# Patient Record
Sex: Male | Born: 1950
Health system: Southern US, Community
[De-identification: ages and names within clinical notes are randomized; demographics above are authoritative.]

## PROBLEM LIST (undated history)

## (undated) DIAGNOSIS — K449 Diaphragmatic hernia without obstruction or gangrene: Secondary | ICD-10-CM

## (undated) DIAGNOSIS — I2510 Atherosclerotic heart disease of native coronary artery without angina pectoris: Secondary | ICD-10-CM

## (undated) DIAGNOSIS — I10 Essential (primary) hypertension: Secondary | ICD-10-CM

## (undated) DIAGNOSIS — T7840XA Allergy, unspecified, initial encounter: Secondary | ICD-10-CM

## (undated) DIAGNOSIS — K56609 Unspecified intestinal obstruction, unspecified as to partial versus complete obstruction: Secondary | ICD-10-CM

## (undated) HISTORY — PX: GASTRIC BYPASS: SHX52

## (undated) HISTORY — PX: CARDIAC CATHETERIZATION: SHX172

## (undated) HISTORY — DX: Atherosclerotic heart disease of native coronary artery without angina pectoris: I25.10

## (undated) HISTORY — DX: Essential (primary) hypertension: I10

## (undated) HISTORY — PX: LAPAROSCOPIC LYSIS OF ADHESIONS: SHX5905

## (undated) HISTORY — PX: APPENDECTOMY: SHX54

## (undated) HISTORY — DX: Unspecified intestinal obstruction, unspecified as to partial versus complete obstruction: K56.609

## (undated) HISTORY — DX: Allergy, unspecified, initial encounter: T78.40XA

## (undated) HISTORY — DX: Diaphragmatic hernia without obstruction or gangrene: K44.9

---

## 2000-01-25 ENCOUNTER — Other Ambulatory Visit: Admission: RE | Admit: 2000-01-25 | Discharge: 2000-01-25 | Payer: Self-pay | Admitting: Family Medicine

## 2000-03-25 ENCOUNTER — Ambulatory Visit (HOSPITAL_BASED_OUTPATIENT_CLINIC_OR_DEPARTMENT_OTHER): Admission: RE | Admit: 2000-03-25 | Discharge: 2000-03-25 | Payer: Self-pay | Admitting: Family Medicine

## 2003-08-14 ENCOUNTER — Encounter (INDEPENDENT_AMBULATORY_CARE_PROVIDER_SITE_OTHER): Payer: Self-pay | Admitting: Specialist

## 2003-08-14 ENCOUNTER — Ambulatory Visit (HOSPITAL_COMMUNITY): Admission: RE | Admit: 2003-08-14 | Discharge: 2003-08-14 | Payer: Self-pay | Admitting: Gastroenterology

## 2004-10-06 ENCOUNTER — Ambulatory Visit: Payer: Self-pay | Admitting: Cardiology

## 2004-10-13 ENCOUNTER — Ambulatory Visit: Payer: Self-pay

## 2007-10-25 ENCOUNTER — Ambulatory Visit (HOSPITAL_COMMUNITY): Admission: RE | Admit: 2007-10-25 | Discharge: 2007-10-25 | Payer: Self-pay | Admitting: Surgery

## 2007-10-31 ENCOUNTER — Encounter: Admission: RE | Admit: 2007-10-31 | Discharge: 2007-10-31 | Payer: Self-pay | Admitting: Surgery

## 2007-10-31 ENCOUNTER — Ambulatory Visit (HOSPITAL_COMMUNITY): Admission: RE | Admit: 2007-10-31 | Discharge: 2007-10-31 | Payer: Self-pay | Admitting: Surgery

## 2008-01-09 ENCOUNTER — Ambulatory Visit (HOSPITAL_COMMUNITY): Admission: RE | Admit: 2008-01-09 | Discharge: 2008-01-09 | Payer: Self-pay | Admitting: Surgery

## 2008-02-27 ENCOUNTER — Ambulatory Visit: Payer: Self-pay | Admitting: Surgery

## 2008-07-06 ENCOUNTER — Encounter: Admission: RE | Admit: 2008-07-06 | Discharge: 2008-07-06 | Payer: Self-pay | Admitting: Family Medicine

## 2008-12-04 ENCOUNTER — Encounter: Admission: RE | Admit: 2008-12-04 | Discharge: 2008-12-04 | Payer: Self-pay | Admitting: Family Medicine

## 2009-05-05 ENCOUNTER — Encounter: Admission: RE | Admit: 2009-05-05 | Discharge: 2009-05-05 | Payer: Self-pay | Admitting: Family Medicine

## 2009-05-20 ENCOUNTER — Inpatient Hospital Stay (HOSPITAL_COMMUNITY): Admission: EM | Admit: 2009-05-20 | Discharge: 2009-05-21 | Payer: Self-pay | Admitting: Emergency Medicine

## 2010-11-17 LAB — DIFFERENTIAL
Basophils Absolute: 0 10*3/uL (ref 0.0–0.1)
Basophils Relative: 0 % (ref 0–1)
Lymphocytes Relative: 20 % (ref 12–46)
Lymphs Abs: 1.3 10*3/uL (ref 0.7–4.0)
Monocytes Absolute: 0.1 10*3/uL (ref 0.1–1.0)
Monocytes Relative: 2 % — ABNORMAL LOW (ref 3–12)
Neutro Abs: 5.1 10*3/uL (ref 1.7–7.7)

## 2010-11-17 LAB — CARDIAC PANEL(CRET KIN+CKTOT+MB+TROPI)
CK, MB: 1.5 ng/mL (ref 0.3–4.0)
Relative Index: 1.3 (ref 0.0–2.5)
Total CK: 109 U/L (ref 7–232)
Troponin I: 0.03 ng/mL (ref 0.00–0.06)

## 2010-11-17 LAB — CSF CELL COUNT WITH DIFFERENTIAL
RBC Count, CSF: 25 /mm3 — ABNORMAL HIGH
RBC Count, CSF: 98 /mm3 — ABNORMAL HIGH
WBC, CSF: 1 /mm3 (ref 0–5)
WBC, CSF: 1 /mm3 (ref 0–5)

## 2010-11-17 LAB — LIPID PANEL
Cholesterol: 163 mg/dL (ref 0–200)
LDL Cholesterol: 89 mg/dL (ref 0–99)
Total CHOL/HDL Ratio: 2.8 RATIO

## 2010-11-17 LAB — BASIC METABOLIC PANEL
BUN: 22 mg/dL (ref 6–23)
Chloride: 98 mEq/L (ref 96–112)
GFR calc non Af Amer: >60 mL/min (ref >60)
Glucose, Bld: 109 mg/dL — ABNORMAL HIGH (ref 70–99)
Potassium: 4.2 mEq/L (ref 3.5–5.1)
Sodium: 136 mEq/L (ref 135–145)

## 2010-11-17 LAB — TROPONIN I: Troponin I: 0.01 ng/mL (ref 0.00–0.06)

## 2010-11-17 LAB — CBC
HCT: 36.3 % — ABNORMAL LOW (ref 39.0–52.0)
HCT: 37.6 % — ABNORMAL LOW (ref 39.0–52.0)
Platelets: 222 10*3/uL (ref 150–400)
RBC: 4.14 MIL/uL — ABNORMAL LOW (ref 4.22–5.81)
RDW: 15 % (ref 11.5–15.5)
WBC: 7.1 10*3/uL (ref 4.0–10.5)

## 2010-11-17 LAB — GLUCOSE, CAPILLARY
Glucose-Capillary: 110 mg/dL — ABNORMAL HIGH (ref 70–99)
Glucose-Capillary: 160 mg/dL — ABNORMAL HIGH (ref 70–99)
Glucose-Capillary: 195 mg/dL — ABNORMAL HIGH (ref 70–99)
Glucose-Capillary: 82 mg/dL (ref 70–99)

## 2010-11-17 LAB — GLUCOSE, CSF: Glucose, CSF: 66 mg/dL (ref 43–76)

## 2010-11-17 LAB — CK TOTAL AND CKMB (NOT AT ARMC): Relative Index: 1.5 (ref 0.0–2.5)

## 2010-11-17 LAB — HEMOGLOBIN A1C: Hgb A1c MFr Bld: 6.4 % — ABNORMAL HIGH (ref 4.6–6.1)

## 2010-11-17 LAB — CSF CULTURE W GRAM STAIN: Culture: NO GROWTH

## 2010-11-17 LAB — PHOSPHORUS: Phosphorus: 4.7 mg/dL — ABNORMAL HIGH (ref 2.3–4.6)

## 2010-11-17 LAB — POCT CARDIAC MARKERS: CKMB, poc: 1.4 ng/mL (ref 1.0–8.0)

## 2010-11-17 LAB — TSH: TSH: 0.603 u[IU]/mL (ref 0.350–4.500)

## 2010-11-17 LAB — RAPID URINE DRUG SCREEN, HOSP PERFORMED
Amphetamines: NOT DETECTED
Cocaine: NOT DETECTED

## 2010-12-27 NOTE — Procedures (Signed)
CAROTID DUPLEX EXAM   INDICATION:  Stroke.   HISTORY:  Diabetes:  Yes.  Cardiac:  No.  Hypertension:  Yes.  Smoking:  No.  Previous Surgery:  No.  CV History:  Patient has had an episode of slurred speech and left arm  numbness and mild facial numbness within the last 24 hours.  Patient  complains of transient episodes of left eye blurriness.  Amaurosis Fugax Yes, Paresthesias Yes, Hemiparesis No.                                       RIGHT             LEFT  Brachial systolic pressure:         138               140  Brachial Doppler waveforms:         Triphasic         Triphasic  Vertebral direction of flow:        Antegrade         Antegrade  DUPLEX VELOCITIES (cm/sec)  CCA peak systolic                   64                114  ECA peak systolic                   102               89  ICA peak systolic                   62                69  ICA end diastolic                   26                33  PLAQUE MORPHOLOGY:                  Calcified, irregular                None  PLAQUE AMOUNT:                      Mild              None  PLAQUE LOCATION:                    Distal CCA, proximal ICA            None   IMPRESSION:  1. 20-39% right internal carotid artery stenosis.  2. No left internal carotid artery stenosis.   ___________________________________________  V. Charlena Cross, MD   MC/MEDQ  D:  02/27/2008  T:  02/27/2008  Job:  [id number]

## 2011-11-14 ENCOUNTER — Encounter: Payer: Self-pay | Admitting: Family Medicine

## 2011-11-14 ENCOUNTER — Ambulatory Visit (INDEPENDENT_AMBULATORY_CARE_PROVIDER_SITE_OTHER): Payer: Self-pay | Admitting: Family Medicine

## 2011-11-14 VITALS — BP 124/86 | HR 82 | Ht 66.5 in | Wt 193.0 lb

## 2011-11-14 DIAGNOSIS — E1169 Type 2 diabetes mellitus with other specified complication: Secondary | ICD-10-CM | POA: Insufficient documentation

## 2011-11-14 DIAGNOSIS — R7309 Other abnormal glucose: Secondary | ICD-10-CM

## 2011-11-14 DIAGNOSIS — Z8673 Personal history of transient ischemic attack (TIA), and cerebral infarction without residual deficits: Secondary | ICD-10-CM

## 2011-11-14 DIAGNOSIS — E1129 Type 2 diabetes mellitus with other diabetic kidney complication: Secondary | ICD-10-CM | POA: Insufficient documentation

## 2011-11-14 DIAGNOSIS — J069 Acute upper respiratory infection, unspecified: Secondary | ICD-10-CM

## 2011-11-14 DIAGNOSIS — E119 Type 2 diabetes mellitus without complications: Secondary | ICD-10-CM | POA: Insufficient documentation

## 2011-11-14 DIAGNOSIS — E785 Hyperlipidemia, unspecified: Secondary | ICD-10-CM

## 2011-11-14 DIAGNOSIS — N529 Male erectile dysfunction, unspecified: Secondary | ICD-10-CM

## 2011-11-14 DIAGNOSIS — G4700 Insomnia, unspecified: Secondary | ICD-10-CM

## 2011-11-14 LAB — PSA: PSA: 1.45 ng/mL (ref <=4.00)

## 2011-11-14 LAB — COMPREHENSIVE METABOLIC PANEL
Alkaline Phosphatase: 81 U/L (ref 39–117)
BUN: 19 mg/dL (ref 6–23)
Creat: 1.05 mg/dL (ref 0.50–1.35)
Glucose, Bld: 117 mg/dL — ABNORMAL HIGH (ref 70–99)
Total Bilirubin: 0.5 mg/dL (ref 0.3–1.2)

## 2011-11-14 LAB — CBC WITH DIFFERENTIAL/PLATELET
Basophils Relative: 0 % (ref 0–1)
Eosinophils Absolute: 0.1 10*3/uL (ref 0.0–0.7)
HCT: 42.2 % (ref 39.0–52.0)
Hemoglobin: 13.9 g/dL (ref 13.0–17.0)
MCH: 29.3 pg (ref 26.0–34.0)
MCHC: 32.9 g/dL (ref 30.0–36.0)
Monocytes Absolute: 0.5 10*3/uL (ref 0.1–1.0)
Monocytes Relative: 8 % (ref 3–12)
RDW: 13.2 % (ref 11.5–15.5)

## 2011-11-14 LAB — LIPID PANEL: Total CHOL/HDL Ratio: 2.1 Ratio

## 2011-11-14 LAB — HEMOGLOBIN A1C: Hgb A1c MFr Bld: 6.2 % — ABNORMAL HIGH (ref <5.7)

## 2011-11-14 MED ORDER — TADALAFIL 20 MG PO TABS
20.0000 mg | ORAL_TABLET | Freq: Every day | ORAL | Status: DC | PRN
Start: 2011-11-14 — End: 2012-02-08

## 2011-11-14 NOTE — Progress Notes (Signed)
  Subjective:    Patient ID: James Frye, male    DOB: 1950/09/12, 61 y.o.   MRN: [id number]  HPI #1 erectile dysfunction. Patient is noted within the last year erectile dysfunction this seems to be getting worse. This is a rather long history with this problem. There was a problem ongoing greater than 3 years when he was a diabetic, morbidly obese, status post stroke, and hypertensive. Then he was on Viagra 100 mg and it worked. Since then he underwent gastric bypass surgery that was complicated with 2 pulmonary embolus and subsequent warfarin therapy for 6 months. He has lost over 100 pounds since surgery had his diabetic and hypertensive medications stopped and regain erectile function.  About a year ago he started having more trouble and was placed on low dose Cialis 5 mg and low-dose Viagra 50 mg without success. He also had testosterone levels done at that time as well. Subsequently his head ongoing frustration with sexual performance and has been concerned. He does not smoke and does exercise 3 times a week 2-4 miles when he walks. He reports being married for about 20 years and rarely having breakthrough erections at this time. Patient is currently retired for about 9 months.  #2 patient also reports having difficulty sleeping with early morning wakening and unable to sleep more than 4 hours at a time.  Review of Systems  Respiratory:       URI like symptoms for about 2 days.  Genitourinary:       He denies any nocturia or frequency. At this time  Psychiatric/Behavioral:       Denies any depression suicidal ideation or dysphoria.  All other systems reviewed and are negative.     BP 124/86  Pulse 82  Ht 5' 6.5" (1.689 m)  Wt 193 lb (87.544 kg)  BMI 30.68 kg/m2  SpO2 100% Objective:   Physical Exam  Constitutional: He is oriented to person, place, and time. He appears well-developed and well-nourished.  Eyes: Pupils are equal, round, and reactive to light.  Neck: Normal range  of motion.  Cardiovascular: Normal rate and regular rhythm.   Pulmonary/Chest: Effort normal and breath sounds normal. No respiratory distress.  Neurological: He is alert and oriented to person, place, and time.  Skin: Skin is warm and dry.  Psychiatric: He has a normal mood and affect. His behavior is normal.      Assessment & Plan:                    #1This is the most pressing issue at this time. Discussed with him that his history diabetes vascular disease i.e. status post stroke, hypertension may have caused progressive vascular disease causing him sexual dysfunction now. Longer testing PSA and testosterone levels we'll proceed with A1c, CBC, CMP TSH, and lipid panel and have him return in 6 weeks for PE. Also samples of 20 mg Cialis tablets given to him with prescription #2 symptoms of URI notify me 48 hours if not better. At that time may need to add a Z-Pak..  #3 insomnia. We'll follow that when he returns.      #1Erectilie dysfunction.

## 2011-11-14 NOTE — Patient Instructions (Signed)
Erectile Dysfunction Erectile dysfunction (ED) is the inability to get a good enough erection to have sexual intercourse. ED may involve:  Inability to get an erection.   Lack of enough hardness to allow penetration.   Loss of the erection before sex is finished.   Premature ejaculation.   Any combination of these problems if they occur more than 25% of the time.  CAUSES  Certain drugs, such as:   Pain relievers.   Antihistamines.   Antidepressants.   Blood pressure medicines.   Water pills.   Ulcer medicines.   Muscle relaxants.   Illegal drugs.   Excessive drinking.   Psychological causes, such as:   Anxiety.   Depression.   Sadness.   Exhaustion.   Performance fear.   Stress.   Physical causes, such as:   Artery problems. This may include diabetes, smoking, liver disease, or atherosclerosis.   High blood pressure.   Hormonal problems, such as low testosterone.   Obesity.   Nerve problems. This may include back or pelvic injuries, diabetes, multiple sclerosis, Parkinson's disease, or some surgeries.  SYMPTOMS  Inability to get an erection.   Lack of enough hardness to allow penetration.   Loss of the erection before sex is finished.   Premature ejaculation.   Normal erections at some times, but with frequent unsatisfactory episodes.   Orgasms that are not satisfactory in sensation or frequency.   Low sexual satisfaction in either partner because of erection problems.   A curved penis occurring with erection. The curve may cause pain or may be too curved to allow for intercourse.   Never having nighttime erections.  DIAGNOSIS Your caregiver can often diagnose this condition by:  Performing a physical exam to find other diseases or specific problems with the penis.   Asking you detailed questions about the problem.   Performing blood tests to check for diabetes or to measure hormone levels.   Performing urine tests to find other  underlying health conditions.   Performing an ultrasound to check for scarring.   Performing a test to check blood flow to the penis.   Doing a sleep study at home to measure nighttime erections.  TREATMENT   You may be prescribed medicines by mouth.   You may be given medicine injections into the penis.   You may be prescribed a vacuum pump with a ring.   Penile implant surgery may be performed. You may receive:   An inflatable implant.   A semi-rigid implant.   Blood vessel surgery may be performed.  HOME CARE INSTRUCTIONS  Take all medicine as directed by your caregiver. Do not take any other medicines without talking to your caregiver first.   Follow your caregiver's directions for specific treatments as prescribed.   Follow up with your caregiver as directed.  Document Released: 07/28/2000 Document Revised: 07/20/2011 Document Reviewed: 11/20/2010 [employer] Patient Information 2012 [employer]. 

## 2011-11-15 LAB — TESTOSTERONE, FREE, TOTAL, SHBG
Sex Hormone Binding: 67 nmol/L (ref 13–71)
Testosterone, Free: 74.2 pg/mL (ref 47.0–244.0)
Testosterone-% Free: 1.3 % — ABNORMAL LOW (ref 1.6–2.9)
Testosterone: 569.52 ng/dL (ref 300–890)

## 2011-12-14 ENCOUNTER — Telehealth: Payer: Self-pay | Admitting: *Deleted

## 2011-12-14 DIAGNOSIS — E119 Type 2 diabetes mellitus without complications: Secondary | ICD-10-CM

## 2011-12-14 MED ORDER — SITAGLIP PHOS-METFORMIN HCL ER 100-1000 MG PO TB24: 1.0000 | ORAL_TABLET | ORAL | Status: DC

## 2011-12-14 NOTE — Telephone Encounter (Signed)
Pt calls and states given Janumet by you and was told to call when needed Rx sent to pharmacy. Needs this sent but I do not see where he is on it or what dose. Please advise

## 2011-12-19 ENCOUNTER — Ambulatory Visit (INDEPENDENT_AMBULATORY_CARE_PROVIDER_SITE_OTHER): Payer: Managed Care, Other (non HMO) | Admitting: Family Medicine

## 2011-12-19 ENCOUNTER — Encounter: Payer: Self-pay | Admitting: Family Medicine

## 2011-12-19 VITALS — BP 117/73 | HR 66 | Ht 66.5 in | Wt 190.0 lb

## 2011-12-19 DIAGNOSIS — E119 Type 2 diabetes mellitus without complications: Secondary | ICD-10-CM

## 2011-12-19 DIAGNOSIS — Z125 Encounter for screening for malignant neoplasm of prostate: Secondary | ICD-10-CM

## 2011-12-19 DIAGNOSIS — Z0000 Encounter for general adult medical examination without abnormal findings: Secondary | ICD-10-CM

## 2011-12-19 DIAGNOSIS — Z136 Encounter for screening for cardiovascular disorders: Secondary | ICD-10-CM

## 2011-12-19 DIAGNOSIS — N529 Male erectile dysfunction, unspecified: Secondary | ICD-10-CM

## 2011-12-19 LAB — POCT URINALYSIS DIPSTICK
Ketones, UA: NEGATIVE
Leukocytes, UA: NEGATIVE
Protein, UA: NEGATIVE

## 2011-12-19 MED ORDER — TADALAFIL 5 MG PO TABS: 5.0000 mg | ORAL_TABLET | Freq: Every day | ORAL | Status: DC | PRN

## 2011-12-19 MED ORDER — SITAGLIP PHOS-METFORMIN HCL ER 50-1000 MG PO TB24: 100.0000 mg | ORAL_TABLET | ORAL | Status: DC

## 2011-12-19 NOTE — Patient Instructions (Signed)
Insulin Resistance Glucose (type of sugar) levels are controlled by a hormone called insulin. Insulin is made by your pancreas. When your blood glucose goes up, insulin is released into your blood. Insulin is required for your body to function normally. However, your body can become resistant your own insulin or to insulin given to treat diabetes. In either case, insulin resistance can lead to serious problems. Taken together, these problems are called Insulin Resistance Syndrome, Metabolic Syndrome, Syndrome X, or Dysmetabolic Syndrome. These problems include:  Diabetes Mellitus Type 2.   Heart disease.   High blood pressure.   Stroke.   Polycystic ovary syndrome.   Fatty liver.  CAUSES  Insulin resistance can develop for many different reasons. It is more likely to happen in people with these conditions or characteristics:  Obesity.   Pregnancy. This includes gestational diabetes (temporary diabetes during pregnancy).   Family history of Diabetes Type 2.   High blood pressure.   Stress of severe illness.   Corticosteroid treatment.   Polycystic ovarian syndrome.   Non-Caucasion race.   Cardiovascular disease.   A darkening and thickening of the skin in fold areas such as the neckline and armpit (acanthosis nigricans).  SYMPTOMS  There are no symptoms. You may have symptoms related to the various complications of insulin resistance.  DIAGNOSIS  Several different things can make your caregiver suspect you have insulin resistance,. These include:  High blood glucose.   Abnormal cholesterol levels.   High uric acid levels.   Changes related to blood pressure.   Changes related to inflammation.   Presence of acanthosis nigricans (see above).  Insulin resistance can be determined with blood tests. An elevated insulin level when you have not eaten might suggest resistance. Other more complicated tests are sometimes necessary. TREATMENT  Lifestyle changes are the most  important treatment for insulin resistance.   If you are overweight and you have insulin resistance, you can improve your insulin sensitivity by loosing 10 to 15 % of your weight.   Moderate exercise for 30 to 40 minutes, 4 days a week, can improve insulin sensitivity.  Some medications can also help improve your insulin sensitivity. Your caregiver can discuss these with you if they are appropriate.  HOME CARE INSTRUCTIONS   Do not smoke.   Keep your weight at a healthy level.   Get exercise.   If you have diabetes, follow your caregiver's directions.   If you have high blood pressure, follow your caregiver's directions.   Take prescribed medications according to the directions.  SEEK MEDICAL CARE IF:   You are diabetic, and you are having problems keeping your blood sugar at target range.   You are having episodes of hypoglycemia.   You feel you might be having side effects from your medicines.   You have symptoms of an illness that is not improving after 3 to 4 days.   You have a sore or wound that is not healing.   You notice a change in vision or a new problem with your vision.   You develop fever of more than 100.5 F (38.1 C).  SEEK IMMEDIATE MEDICAL CARE IF:   Your blood sugar goes below 70, especially if you have confusion, lightheadedness or other symptoms with it.   Your blood sugar is very high (as advised by your caregiver) twice in a row.   An unexplained oral temperature above 102.0 F (38.9 C) develops.   You pass out.   You have chest pain and/or   trouble breathing.   You have a sudden, severe headache   You have sudden weakness in one arm and/or one leg.   You have sudden difficulty speaking and/or swallowing.   You develop vomiting and/or diarrhea that is getting worse or not improving after 1 day.  Document Released: 09/19/2005 Document Revised: 07/20/2011 Document Reviewed: 09/17/2008 [employer] Patient Information 2012 [employer]. 

## 2011-12-19 NOTE — Progress Notes (Signed)
Subjective:    Patient ID: James Frye, male    DOB: 04/12/1951, 61 y.o.   MRN: [id number]  HPI Patient's here for health maintenance yearly exam. He does report improvement with sexual functioning since being on Cialis but very concerned about the cost. He also reports being concerned about the cost of the Janumet but does report feeling better since being on it am pleased with the fact of the medication.  Review of Systems  All other systems reviewed and are negative.   No Known Allergies History   Social History  . Marital Status: Married    Spouse Name: N/A    Number of Children: N/A  . Years of Education: N/A   Occupational History  . Not on file.   Social History Main Topics  . Smoking status: Former Games developer  . Smokeless tobacco: Never Used  . Alcohol Use: No  . Drug Use: No  . Sexually Active: Yes   Other Topics Concern  . Not on file   Social History Narrative  . No narrative on file   Family History  Problem Relation Age of Onset  . Cancer Mother   . Stroke Father   . Heart disease Father    Past Medical History  Diagnosis Date  . Diabetes mellitus   . Hypertension    Past Surgical History  Procedure Date  . Gastric bypass        BP 117/73  Pulse 66  Ht 5' 6.5" (1.689 m)  Wt 190 lb (86.183 kg)  BMI 30.21 kg/m2  SpO2 100% Objective:   Physical Exam  Constitutional: He is oriented to person, place, and time. He appears well-developed and well-nourished.  HENT:  Head: Normocephalic and atraumatic.  Right Ear: External ear normal.  Left Ear: External ear normal.  Eyes: Conjunctivae are normal. Pupils are equal, round, and reactive to light.  Fundoscopic exam:      The right eye shows no arteriolar narrowing, no AV nicking and no exudate.       The left eye shows no AV nicking.  Neck: Normal range of motion. Neck supple. No tracheal deviation present.  Cardiovascular: Normal rate, regular rhythm, normal heart sounds and intact distal  pulses.   Pulmonary/Chest: Effort normal and breath sounds normal. No respiratory distress. He has no wheezes. He has no rales.  Abdominal: Soft. Bowel sounds are normal. Hernia confirmed negative in the right inguinal area and confirmed negative in the left inguinal area.  Genitourinary: Prostate normal and testes normal. Rectal exam shows no fissure, no mass, no tenderness and anal tone normal. Guaiac negative stool. Prostate is not enlarged and not tender. Right testis shows no mass, no swelling and no tenderness. Left testis shows no mass, no swelling and no tenderness. Circumcised. No phimosis, hypospadias or penile erythema.  Musculoskeletal: Normal range of motion. He exhibits no edema and no tenderness.  Lymphadenopathy:       Right: No inguinal adenopathy present.       Left: No inguinal adenopathy present.  Neurological: He is alert and oriented to person, place, and time. He has normal reflexes.  Skin: Skin is warm and dry.  Psychiatric: He has a normal mood and affect. His behavior is normal.   Results for orders placed in visit on 12/19/11  POCT URINALYSIS DIPSTICK      Component Value Range   Color, UA yellow     Clarity, UA clear     Glucose, UA neg  Bilirubin, UA neg     Ketones, UA neg     Spec Grav, UA >=1.030     Blood, UA neg     pH, UA 5.5     Protein, UA neg     Urobilinogen, UA 0.2     Nitrite, UA neg     Leukocytes, UA Negative     EKG shows no specific abnormalities. Some nonspecific sinus arrhythmia present.    Asessment & Plan:    #1erectile dysfunction.Will make available different strengths  Of Cialis to see which pne will be best for him. #2 health maintenance. #3 diabetes. Continue with the Janumet but will make alteration and the dosage and allow him to take 50-1000 of the extended-release 1-2 tablets a day as he sees fit at this time.

## 2011-12-20 ENCOUNTER — Encounter: Payer: Self-pay | Admitting: *Deleted

## 2012-02-08 ENCOUNTER — Other Ambulatory Visit: Payer: Self-pay | Admitting: *Deleted

## 2012-02-08 DIAGNOSIS — E1169 Type 2 diabetes mellitus with other specified complication: Secondary | ICD-10-CM

## 2012-02-08 MED ORDER — TADALAFIL 20 MG PO TABS: 20.0000 mg | ORAL_TABLET | Freq: Every day | ORAL | Status: DC | PRN

## 2012-03-19 ENCOUNTER — Ambulatory Visit (INDEPENDENT_AMBULATORY_CARE_PROVIDER_SITE_OTHER): Payer: Managed Care, Other (non HMO) | Admitting: Family Medicine

## 2012-03-19 ENCOUNTER — Encounter: Payer: Self-pay | Admitting: Family Medicine

## 2012-03-19 VITALS — BP 105/69 | HR 85 | Ht 66.5 in | Wt 185.0 lb

## 2012-03-19 DIAGNOSIS — E1169 Type 2 diabetes mellitus with other specified complication: Secondary | ICD-10-CM

## 2012-03-19 DIAGNOSIS — N521 Erectile dysfunction due to diseases classified elsewhere: Secondary | ICD-10-CM

## 2012-03-19 DIAGNOSIS — E119 Type 2 diabetes mellitus without complications: Secondary | ICD-10-CM

## 2012-03-19 DIAGNOSIS — N529 Male erectile dysfunction, unspecified: Secondary | ICD-10-CM

## 2012-03-19 DIAGNOSIS — B351 Tinea unguium: Secondary | ICD-10-CM

## 2012-03-19 MED ORDER — CICLOPIROX 8 % EX SOLN: Freq: Every day | CUTANEOUS | Status: DC

## 2012-03-19 MED ORDER — TADALAFIL 20 MG PO TABS: 20.0000 mg | ORAL_TABLET | Freq: Every day | ORAL | Status: DC | PRN

## 2012-03-19 MED ORDER — TADALAFIL 5 MG PO TABS: 5.0000 mg | ORAL_TABLET | Freq: Every day | ORAL | Status: DC | PRN

## 2012-03-19 NOTE — Progress Notes (Signed)
  Subjective:    Patient ID: James Frye, male    DOB: 11/07/1950, 61 y.o.   MRN: [id number]  HPI #1 diabetes here for followup of his diabetes. He reports eating well exercising as well #2 situation disturbance. He was quite concerned about his wife who apparently had a light heart attack recently. She is in her 86s and he had multiple questions all the best way to make sure she stays healthy .#3 health maintenance. Updated his medical need recommendations. He had a colonoscopy roughly 2 years ago in March. #4 sexual dysfunction/erectile dysfunction. He states that Cialis 20 seemed to have been adequate and allowing him to successfully have sexual activity but that the 5 mg daily did not. Interesting somehow his insurance company approve him for 8 tablets of 5 mg a month but did not really help any significant manner.   Review of Systems  Constitutional: Negative for activity change and unexpected weight change.  Musculoskeletal: Negative.   Skin: Positive for rash.       Fungal infection over the 2nd toe  All other systems reviewed and are negative.      BP 105/69  Pulse 85  Ht 5' 6.5" (1.689 m)  Wt 185 lb (83.915 kg)  BMI 29.41 kg/m2  SpO2 99% Objective:   Physical Exam  Vitals reviewed. Constitutional: He appears well-developed and well-nourished.  HENT:  Head: Normocephalic.  Neck: Normal range of motion.  Cardiovascular: Normal rate and regular rhythm.   Pulmonary/Chest: Effort normal and breath sounds normal.  Musculoskeletal: Normal range of motion.       Evaluation of his feet were done. He had both great toes signs of ingrown toenail, both feet the second toe had fungal nail infection. Pulses intact and sensation to touch in place  Neurological: He is alert.  Psychiatric: He has a normal mood and affect.        Results for orders placed in visit on 03/19/12  POCT GLYCOSYLATED HEMOGLOBIN (HGB A1C)      Component Value Range   Hemoglobin A1C 5.8      Assessment & Plan:  #1 weight loss continues patient congratulated  #2 diabetes A1c 5.8. It appears that the metformin as his diabetes under good control .#3 maintenance issue overdue corrected at this time with colonoscopyand ophthalmologic of referral recommended .#4 situational disturbance. Reassure patient that he seems to be doing all he can't afford his wife and to continue supporting her. #5 erectile dysfunction. New prescription for Cialis 20 mg #8 to use it when necessary and also printed prescription for Cialis 5 mg #30 that he can use with a voucher for daily Cialis that he can probably down load if we don't have any her and he can take for those when needed.  Return in 3 months followup.

## 2012-03-19 NOTE — Patient Instructions (Addendum)
Diabetic Retinopathy Having diabetes for a long time, especially if it is not controlled, can damage the light-sensitive membrane at the back of the eye (retina). The disease of the retina caused by diabetes is called diabetic retinopathy. Taking good care of your diabetes helps reduce the risk of developing diabetic retinopathy. Have regular eye exams. Early detection is the key to keeping your eyes healthy. Diabetes can also affect other parts of the eye with vision-threatening results, such as cataracts and a form of glaucoma that is very difficult to treat. SYMPTOMS  In the early stages of diabetic retinopathy, there are often no symptoms. As the condition advances, symptoms may include:  Blurred vision. This may go away when blood glucose (sugar) levels are normal. This type of reversible change in vision is usually due to swelling of the lens.   Moving speck or dark spots (floaters) in your vision. This can be caused by small amounts of blood (hemorrhages) escaping from the blood vessels of the retina.   Missing parts of your field of vision. This can be caused by larger hemorrhages within the tissue of the retina.   Poor night vision.   Poor color vision.   Sudden drop or loss of vision in one eye. This may be caused by a hemorrhage from retinal blood vessels into the cavity of the inside of the eye.  You should not wait until you have symptoms. An eye care specialist can start treatment before visual impairment occurs. DIAGNOSIS  Your eye care specialist can detect the diabetic changes in your blood vessels by putting drops in your eyes to enlarge the size of (dilate) your pupils. This allows a bigger "window" through which the caregiver can see the entire inside of your eyes.  TREATMENT   If you have the type of diabetes that requires you to use insulin, your risk of diabetic retinopathy is very high. You should have your eyes checked at least every 6 months.   If you have diabetes  that was diagnosed during childhood or before the age of 51, your risk of diabetic retinopathy is very high. You should have your eyes checked at least every 6 months.   If you have diabetes that is controlled by diet, you should have the dilated eye exam when first diagnosed and yearly thereafter.   If your diabetes is not under good control as measured by your blood glucose levels and other indicators, it is critical that you have your eyes checked even more often.  Your caregiver can usually see the problems of diabetic retinopathy developing long before it causes a problem. In many cases, it can be treated to prevent complications. HOME CARE INSTRUCTIONS   Keep blood pressure in goal range.   Keep blood glucose in target range.   Follow your caregiver's orders regarding diet and other means for controlling your blood glucose levels.   Check both your urine and blood levels for glucose as recommended by your caregiver.  SEEK MEDICAL CARE IF:   You notice gradual blurring or other changes in your vision over time.   You notice that your glasses or contact lenses do not make things look as sharp as they once did.   You have trouble reading or seeing details at a distance with either eye.  SEEK IMMEDIATE MEDICAL CARE IF:   You notice a sudden change in your vision or parts of your field of vision appear missing or hazy. This may mean you have lost some vision. Get  help right away to prevent further vision loss.   You suddenly see moving specks or dark spots in the field of vision of either eye.   You have a sudden partial or total loss of vision in either eye.  Document Released: 07/28/2000 Document Revised: 07/20/2011 Document Reviewed: 04/21/2009 [facility] Patient Information 2012 [place], [place].Ringworm, Nail A fungal infection of the nail (tinea unguium/onychomycosis) is common. It is common as the visible part of the nail is composed of dead cells which have no blood supply to  help prevent infection. It occurs because fungi are everywhere and will pick any opportunity to grow on any dead material. Because nails are very slow growing they require up to 2 years of treatment with anti-fungal medications. The entire nail back to the base is infected. This includes approximately ? of the nail which you cannot see. If your caregiver has prescribed a medication by mouth, take it every day and as directed. No progress will be seen for at least 6 to 9 months. Do not be disappointed! Because fungi live on dead cells with little or no exposure to blood supply, medication delivery to the infection is slow; thus the cure is slow. It is also why you can observe no progress in the first 6 months. The nail becoming cured is the base of the nail, as it has the blood supply. Topical medication such as creams and ointments are usually not effective. Important in successful treatment of nail fungus is closely following the medication regimen that your doctor prescribes. Sometimes you and your caregiver may elect to speed up this process by surgical removal of all the nails. Even this may still require 6 to 9 months of additional oral medications. See your caregiver as directed. Remember there will be no visible improvement for at least 6 months. See your caregiver sooner if other signs of infection (redness and swelling) develop. Document Released: 07/28/2000 Document Revised: 07/20/2011 Document Reviewed: 10/06/2008 [facility] Patient Information 2012 [place], [place].

## 2012-03-21 ENCOUNTER — Ambulatory Visit: Payer: Managed Care, Other (non HMO) | Admitting: Family Medicine

## 2012-03-26 ENCOUNTER — Other Ambulatory Visit: Payer: Self-pay | Admitting: *Deleted

## 2012-03-26 DIAGNOSIS — E1169 Type 2 diabetes mellitus with other specified complication: Secondary | ICD-10-CM

## 2012-03-26 MED ORDER — CICLOPIROX 8 % EX SOLN: Freq: Every day | CUTANEOUS | Status: DC

## 2012-03-26 MED ORDER — TADALAFIL 20 MG PO TABS: 20.0000 mg | ORAL_TABLET | Freq: Every day | ORAL | Status: DC | PRN

## 2012-06-20 ENCOUNTER — Encounter: Payer: Self-pay | Admitting: Family Medicine

## 2012-06-20 ENCOUNTER — Ambulatory Visit (INDEPENDENT_AMBULATORY_CARE_PROVIDER_SITE_OTHER): Payer: Managed Care, Other (non HMO) | Admitting: Family Medicine

## 2012-06-20 VITALS — BP 118/75 | HR 88 | Ht 66.5 in | Wt 176.0 lb

## 2012-06-20 DIAGNOSIS — Z0000 Encounter for general adult medical examination without abnormal findings: Secondary | ICD-10-CM

## 2012-06-20 DIAGNOSIS — E119 Type 2 diabetes mellitus without complications: Secondary | ICD-10-CM

## 2012-06-20 DIAGNOSIS — Z23 Encounter for immunization: Secondary | ICD-10-CM

## 2012-06-20 LAB — POCT GLYCOSYLATED HEMOGLOBIN (HGB A1C): Hemoglobin A1C: 5.6

## 2012-06-20 MED ORDER — TADALAFIL 5 MG PO TABS: 5.0000 mg | ORAL_TABLET | Freq: Every day | ORAL | Status: DC | PRN

## 2012-06-20 MED ORDER — TERBINAFINE HCL 250 MG PO TABS: 250.0000 mg | ORAL_TABLET | Freq: Every day | ORAL | Status: DC

## 2012-06-20 MED ORDER — LORATADINE-PSEUDOEPHEDRINE ER 5-120 MG PO TB12: 1.0000 | ORAL_TABLET | Freq: Two times a day (BID) | ORAL | Status: DC

## 2012-06-20 MED ORDER — SITAGLIP PHOS-METFORMIN HCL ER 50-1000 MG PO TB24: 100.0000 mg | ORAL_TABLET | ORAL | Status: DC

## 2012-06-20 MED ORDER — AMMONIUM LACTATE 12 % EX LOTN: TOPICAL_LOTION | CUTANEOUS | Status: AC

## 2012-06-20 NOTE — Progress Notes (Signed)
  Subjective:    Patient ID: James Frye, male    DOB: [birth year], 61 y.o.   MRN: [id number]  HPI #1 diabetes. He is taking the Januvamet 2 tablets without any trouble.   #2 fungal infection of the nail beds and feet. He reports the Penlac caused 1 nail of the for to come off but did not do anything to the other nails. He also reports discoloration and dryness of his feet and states that when he use ammonium lactate lotion prescribed by another Dr. that seemed to help his feet a great deal. He would like to get a prescription for this.  #3 erectile dysfunction either the Cialis 5 or 20 seem to work for him. The main thing   was the cost which he cannot afford previously. He is actually working 3 jobs now and while it doesn't pay what he was paid before it has helped his financial situation.   #4 immunization update. He needs flu shot.  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 This seems to only happen when he is there even when he is not cold.  Review of Systems  Constitutional: Negative for activity change and appetite change.  HENT: Positive for rhinorrhea, sneezing and postnasal drip.   Skin: Positive for rash.       Both feet.  All other systems reviewed and are negative.      BP 118/75  Pulse 88  Ht 5' 6.5" (1.689 m)  Wt 176 lb (79.833 kg)  BMI 27.98 kg/m2  SpO2 100% Objective:   Physical Exam  Vitals reviewed. Constitutional: He is oriented to person, place, and time. He appears well-developed and well-nourished.  Musculoskeletal: He exhibits no edema and no tenderness.       Fungal infection present on the first 2 toes of both feet. The palmar and dorsal surface of  both feet appear dry.   Neurological: He is alert and oriented to person, place, and time.  Skin: Skin is dry.  Psychiatric: He has a normal mood and affect. His behavior is normal.       Results for orders placed in visit on 06/20/12  POCT  GLYCOSYLATED HEMOGLOBIN (HGB A1C)      Component Value Range   Hemoglobin A1C 5.6     Assessment & Plan:  #1 diabetes under excellent control. Continue with the 2 Janumet XR tablets at one time.  #2 fungal infection of the toenails and feet. We will prescribe ammonia lactate lotion to work well for his feet and also place him on Lamisil 250 mg tablet one tablet a day but warned that he'll need to take this for 6 months.  #3 erectile dysfunction. Samples of Cialis 5 mg #30, voucher for 30, and prescription for a year given to patient as well.  #4 immunization update. Flu shot given.  #5 preop allergies. We'll try him on Claritin D. 12 hours and see if this helps with the allergies.  Followup in 4 months for repeat A1c. Patient warned that his current provider will be leaving this practice shortly

## 2012-06-20 NOTE — Patient Instructions (Addendum)
Ringworm, Nail A fungal infection of the nail (tinea unguium/onychomycosis) is common. It is common as the visible part of the nail is composed of dead cells which have no blood supply to help prevent infection. It occurs because fungi are everywhere and will pick any opportunity to grow on any dead material. Because nails are very slow growing they require up to 2 years of treatment with anti-fungal medications. The entire nail back to the base is infected. This includes approximately  of the nail which you cannot see. If your caregiver has prescribed a medication by mouth, take it every day and as directed. No progress will be seen for at least 6 to 9 months. Do not be,,,,,,,,,,,, disappointed! Because fungi live on dead cells with little or no exposure to blood supply, medication delivery to the infection is slow; thus the cure is slow. It is also why you can observe no progress in the first 6 months. The nail becoming cured is the base of the nail, as it has the blood supply. Topical medication such as creams and ointments are usually not effective. Important in successful treatment of nail fungus is closely following the medication regimen that your doctor prescribes. Sometimes you and your caregiver may elect to speed up this process by surgical removal of all the nails. Even this may still require 6 to 9 months of additional oral medications. See your caregiver as directed. Remember there will be no visible improvement for at least 6 months. See your caregiver sooner if other signs of infection (redness and swelling) develop. Document Released: 07/28/2000 Document Revised: 10/23/2011 Document Reviewed: 10/06/2008 [facility] Patient Information 2013 [place], [place].

## 2012-07-04 ENCOUNTER — Emergency Department (HOSPITAL_BASED_OUTPATIENT_CLINIC_OR_DEPARTMENT_OTHER)
Admission: EM | Admit: 2012-07-04 | Discharge: 2012-07-04 | Disposition: A | Discharge status: Home / Self Care | Payer: Worker's Compensation | Attending: Emergency Medicine | Admitting: Emergency Medicine

## 2012-07-04 ENCOUNTER — Emergency Department (HOSPITAL_BASED_OUTPATIENT_CLINIC_OR_DEPARTMENT_OTHER): Payer: Worker's Compensation

## 2012-07-04 ENCOUNTER — Encounter (HOSPITAL_BASED_OUTPATIENT_CLINIC_OR_DEPARTMENT_OTHER): Payer: Self-pay | Admitting: *Deleted

## 2012-07-04 DIAGNOSIS — Y939 Activity, unspecified: Secondary | ICD-10-CM | POA: Insufficient documentation

## 2012-07-04 DIAGNOSIS — Y9289 Other specified places as the place of occurrence of the external cause: Secondary | ICD-10-CM | POA: Insufficient documentation

## 2012-07-04 DIAGNOSIS — M2540 Effusion, unspecified joint: Secondary | ICD-10-CM | POA: Insufficient documentation

## 2012-07-04 DIAGNOSIS — I10 Essential (primary) hypertension: Secondary | ICD-10-CM | POA: Insufficient documentation

## 2012-07-04 DIAGNOSIS — S7010XA Contusion of unspecified thigh, initial encounter: Secondary | ICD-10-CM | POA: Insufficient documentation

## 2012-07-04 DIAGNOSIS — Z79899 Other long term (current) drug therapy: Secondary | ICD-10-CM | POA: Insufficient documentation

## 2012-07-04 DIAGNOSIS — IMO0002 Reserved for concepts with insufficient information to code with codable children: Secondary | ICD-10-CM | POA: Insufficient documentation

## 2012-07-04 DIAGNOSIS — E119 Type 2 diabetes mellitus without complications: Secondary | ICD-10-CM | POA: Insufficient documentation

## 2012-07-04 DIAGNOSIS — Z87891 Personal history of nicotine dependence: Secondary | ICD-10-CM | POA: Insufficient documentation

## 2012-07-04 MED ORDER — IBUPROFEN 800 MG PO TABS: 800.0000 mg | ORAL_TABLET | Freq: Three times a day (TID) | ORAL | Status: DC

## 2012-07-04 NOTE — ED Provider Notes (Signed)
Medical screening examination/treatment/procedure(s) were performed by non-physician practitioner and as supervising physician I was immediately available for consultation/collaboration.  Ethelda Chick, MD 07/04/12 2258

## 2012-07-04 NOTE — ED Notes (Signed)
Patient transported to X-ray 

## 2012-07-04 NOTE — ED Notes (Signed)
06-21-12 while at work a metal drum full of liquid fell onto his left leg. Painful and swollen. Supervisor sent him here for evaluation.

## 2012-07-04 NOTE — ED Notes (Signed)
Work required UDS attempted but pt was unable to give sufficient amount of urine.

## 2012-07-04 NOTE — ED Provider Notes (Signed)
History     CSN: [id number]  Arrival date & time 07/04/12  1958   First MD Initiated Contact with Patient 07/04/12 2123      Chief Complaint  Patient presents with  . Leg Pain    (Consider location/radiation/quality/duration/timing/severity/associated sxs/prior treatment) Patient is a 61 y.o. male presenting with leg pain. The history is provided by the patient. No language interpreter was used.  Leg Pain  The incident occurred more than 1 week ago. The incident occurred at work. The injury mechanism was a direct blow. The pain is present in the left leg. The quality of the pain is described as aching. The pain is at a severity of 7/10. The pain is moderate. The pain has been constant since onset. Associated symptoms include muscle weakness. Pertinent negatives include no numbness and no inability to bear weight. He reports no foreign bodies present. Nothing aggravates the symptoms. He has tried nothing for the symptoms.    Past Medical History  Diagnosis Date  . Diabetes mellitus   . Hypertension     Past Surgical History  Procedure Date  . Gastric bypass     Family History  Problem Relation Age of Onset  . Cancer Mother   . Stroke Father   . Heart disease Father     History  Substance Use Topics  . Smoking status: Former Games developer  . Smokeless tobacco: Never Used  . Alcohol Use: No      Review of Systems  Musculoskeletal: Positive for joint swelling.  Skin: Positive for wound.  Neurological: Negative for numbness.  All other systems reviewed and are negative.    Allergies  Penicillins  Home Medications   Current Outpatient Rx  Name  Route  Sig  Dispense  Refill  . AMMONIUM LACTATE 12 % EX LOTN      Apply to affected area daily   400 g   3   . LORATADINE-PSEUDOEPHEDRINE ER 5-120 MG PO TB12   Oral   Take 1 tablet by mouth 2 (two) times daily.   180 tablet   3   . SITAGLIPTIN-METFORMIN HCL ER 50-1000 MG PO TB24   Oral   Take 100-2,000 mg by  mouth 1 day or 1 dose.   180 tablet   3   . TADALAFIL 20 MG PO TABS   Oral   Take 1 tablet (20 mg total) by mouth daily as needed for erectile dysfunction.   8 tablet   0   . TADALAFIL 5 MG PO TABS   Oral   Take 1 tablet (5 mg total) by mouth daily as needed for erectile dysfunction.   30 tablet   1   . TADALAFIL 5 MG PO TABS   Oral   Take 1 tablet (5 mg total) by mouth daily as needed for erectile dysfunction.   30 tablet   11   . TERBINAFINE HCL 250 MG PO TABS   Oral   Take 1 tablet (250 mg total) by mouth daily.   84 tablet   2     BP 132/86  Pulse 74  Temp 98.6 F (37 C) (Oral)  Resp 18  Ht 5\' 9"  (1.753 m)  Wt 181 lb (82.101 kg)  BMI 26.73 kg/m2  SpO2 100%  Physical Exam  Nursing note and vitals reviewed. Constitutional: He is oriented to person, place, and time. He appears well-developed and well-nourished.  HENT:  Head: Normocephalic and atraumatic.  Musculoskeletal: He exhibits tenderness.  Bruised left inner thigh,  Diffusely tender,  Ns and nv intact  Neurological: He is alert and oriented to person, place, and time. He has normal reflexes.  Skin: There is erythema.  Psychiatric: He has a normal mood and affect.    ED Course  Procedures (including critical care time)  Labs Reviewed - No data to display No results found.   1. Hematoma of thigh       MDM  Pt advised ice and ibuprofen.   Pt advised follow up with Dr. Pearletha Forge for recheck in 1 week if pain persist        Lonia Skinner [place], [place] 07/04/12 2226  Lonia Skinner [place], [place] 07/04/12 2236

## 2012-07-23 ENCOUNTER — Other Ambulatory Visit: Payer: Self-pay | Admitting: *Deleted

## 2012-07-23 MED ORDER — AMBULATORY NON FORMULARY MEDICATION: Status: DC

## 2012-07-24 ENCOUNTER — Other Ambulatory Visit: Payer: Self-pay | Admitting: *Deleted

## 2012-08-20 ENCOUNTER — Other Ambulatory Visit: Payer: Self-pay | Admitting: *Deleted

## 2012-08-20 MED ORDER — AMBULATORY NON FORMULARY MEDICATION: Status: DC

## 2012-08-20 NOTE — Telephone Encounter (Signed)
Pt needs refill on his Freedom Style Lite test strips. Was a pt of Dr. Thurmond Butts. Meed printed and faxed

## 2012-08-21 ENCOUNTER — Other Ambulatory Visit: Payer: Self-pay

## 2012-08-21 DIAGNOSIS — E119 Type 2 diabetes mellitus without complications: Secondary | ICD-10-CM

## 2012-08-21 MED ORDER — AMBULATORY NON FORMULARY MEDICATION: Status: DC

## 2012-10-09 ENCOUNTER — Ambulatory Visit (INDEPENDENT_AMBULATORY_CARE_PROVIDER_SITE_OTHER): Payer: Managed Care, Other (non HMO) | Admitting: Sports Medicine

## 2012-10-09 ENCOUNTER — Ambulatory Visit (HOSPITAL_BASED_OUTPATIENT_CLINIC_OR_DEPARTMENT_OTHER)
Admission: RE | Admit: 2012-10-09 | Discharge: 2012-10-09 | Disposition: A | Discharge status: Home / Self Care | Payer: Managed Care, Other (non HMO) | Source: Ambulatory Visit | Attending: Sports Medicine | Admitting: Sports Medicine

## 2012-10-09 ENCOUNTER — Encounter (HOSPITAL_BASED_OUTPATIENT_CLINIC_OR_DEPARTMENT_OTHER): Payer: Self-pay

## 2012-10-09 ENCOUNTER — Encounter: Payer: Self-pay | Admitting: Sports Medicine

## 2012-10-09 ENCOUNTER — Ambulatory Visit (INDEPENDENT_AMBULATORY_CARE_PROVIDER_SITE_OTHER): Payer: Managed Care, Other (non HMO)

## 2012-10-09 VITALS — BP 133/78 | HR 78 | Wt 171.0 lb

## 2012-10-09 DIAGNOSIS — R079 Chest pain, unspecified: Secondary | ICD-10-CM

## 2012-10-09 DIAGNOSIS — Z9884 Bariatric surgery status: Secondary | ICD-10-CM

## 2012-10-09 DIAGNOSIS — R0602 Shortness of breath: Secondary | ICD-10-CM | POA: Insufficient documentation

## 2012-10-09 LAB — COMPREHENSIVE METABOLIC PANEL
ALT: 11 U/L (ref 0–53)
AST: 11 U/L (ref 0–37)
CO2: 27 mEq/L (ref 19–32)
Calcium: 9.6 mg/dL (ref 8.4–10.5)
Chloride: 103 mEq/L (ref 96–112)
Creat: 1.24 mg/dL (ref 0.50–1.35)
Sodium: 138 mEq/L (ref 135–145)
Total Bilirubin: 0.5 mg/dL (ref 0.3–1.2)
Total Protein: 7.7 g/dL (ref 6.0–8.3)

## 2012-10-09 LAB — CBC
HCT: 40.1 % (ref 39.0–52.0)
Hemoglobin: 13.9 g/dL (ref 13.0–17.0)
MCH: 30.1 pg (ref 26.0–34.0)
MCHC: 34.7 g/dL (ref 30.0–36.0)
MCV: 86.8 fL (ref 78.0–100.0)
Platelets: 281 10*3/uL (ref 150–400)
RBC: 4.62 MIL/uL (ref 4.22–5.81)
RDW: 14.2 % (ref 11.5–15.5)
WBC: 6.1 10*3/uL (ref 4.0–10.5)

## 2012-10-09 LAB — COMPREHENSIVE METABOLIC PANEL WITH GFR
Albumin: 4.3 g/dL (ref 3.5–5.2)
Alkaline Phosphatase: 80 U/L (ref 39–117)
BUN: 19 mg/dL (ref 6–23)
Glucose, Bld: 103 mg/dL — ABNORMAL HIGH (ref 70–99)
Potassium: 5.2 meq/L (ref 3.5–5.3)

## 2012-10-09 LAB — D-DIMER, QUANTITATIVE: D-Dimer, Quant: 0.53 ug{FEU}/mL — ABNORMAL HIGH (ref 0.00–0.48)

## 2012-10-09 MED ORDER — RANITIDINE HCL 300 MG PO TABS: 300.0000 mg | ORAL_TABLET | Freq: Two times a day (BID) | ORAL | Status: DC

## 2012-10-09 MED ORDER — OMEPRAZOLE 40 MG PO CPDR: 40.0000 mg | DELAYED_RELEASE_CAPSULE | Freq: Every day | ORAL | Status: DC

## 2012-10-09 MED ORDER — IOHEXOL 350 MG/ML SOLN
100.0000 mL | Freq: Once | INTRAVENOUS | Status: AC | PRN
  Administered 2012-10-09: 100 mL via INTRAVENOUS

## 2012-10-09 NOTE — Progress Notes (Signed)
Subjective:    CC: Chest pain  HPI: This is a very pleasant 62 year old male with a history of gastric bypass as well as diabetes he comes in with a several-day history of substernal chest pain that occurred without exertion, and has been fairly constant. The pain is localized, radiates to the back, but not to the arm or neck, no associated diaphoresis, nausea, or presyncope. No palpitations. He does recall several episodes of sour brash, throat clearing, and acid reflux like symptoms. He's never had cardiac issues before. Of note he did have a cardiac catheterization as well as exercise stress test approximately 2 years ago, both were negative.  Past medical history, Surgical history, Family history not pertinant except as noted below, Social history, Allergies, and medications have been entered into the medical record, reviewed, and no changes needed.   Review of Systems: No fevers, chills, night sweats, weight loss, chest pain, or shortness of breath.   Objective:    General: Well Developed, well nourished, and in no acute distress.  Neuro: Alert and oriented x3, extra-ocular muscles intact, sensation grossly intact.  HEENT: Normocephalic, atraumatic, pupils equal round reactive to light, neck supple, no masses, no lymphadenopathy, thyroid nonpalpable.  Skin: Warm and dry, no rashes. Cardiac: Regular rate and rhythm, no murmurs rubs or gallops. Chest is nontender to palpation. Respiratory: Clear to auscultation bilaterally. Not using accessory muscles, speaking in full sentences. Abdomen: Soft, nontender, nondistended, normal bowel sounds.  12-lead EKG: Normal sinus rhythm, normal axis, no ST changes, and no change from EKG last year.  Impression and Recommendations:

## 2012-10-09 NOTE — Assessment & Plan Note (Addendum)
Symptoms do not sound cardiac, or nonexertional. He has had a negative stress test and negative cardiac catheterization approximately 2 years ago just prior to his gastric bypass. I am going to obtain an EKG, as well as some blood work including a d-dimer. Chest x-ray. I do think his symptoms most likely represent worsening acid reflux. Adding Prilosec and Zantac.

## 2012-10-09 NOTE — Addendum Note (Signed)
Addended by: [name] on: 10/09/2012 01:16 PM   Modules accepted: Orders

## 2012-10-15 ENCOUNTER — Encounter: Payer: Self-pay | Admitting: *Deleted

## 2012-10-25 ENCOUNTER — Encounter: Payer: Self-pay | Admitting: *Deleted

## 2012-10-25 ENCOUNTER — Ambulatory Visit (INDEPENDENT_AMBULATORY_CARE_PROVIDER_SITE_OTHER): Payer: Managed Care, Other (non HMO)

## 2012-10-25 ENCOUNTER — Encounter: Payer: Self-pay | Admitting: Sports Medicine

## 2012-10-25 ENCOUNTER — Ambulatory Visit (INDEPENDENT_AMBULATORY_CARE_PROVIDER_SITE_OTHER): Payer: Managed Care, Other (non HMO) | Admitting: Sports Medicine

## 2012-10-25 VITALS — BP 125/79 | HR 92 | Wt 172.0 lb

## 2012-10-25 DIAGNOSIS — N529 Male erectile dysfunction, unspecified: Secondary | ICD-10-CM

## 2012-10-25 DIAGNOSIS — Z0389 Encounter for observation for other suspected diseases and conditions ruled out: Secondary | ICD-10-CM

## 2012-10-25 DIAGNOSIS — K5900 Constipation, unspecified: Secondary | ICD-10-CM

## 2012-10-25 DIAGNOSIS — E1169 Type 2 diabetes mellitus with other specified complication: Secondary | ICD-10-CM

## 2012-10-25 MED ORDER — POLYETHYLENE GLYCOL 3350 17 G PO PACK: 17.0000 g | PACK | Freq: Two times a day (BID) | ORAL | Status: DC

## 2012-10-25 MED ORDER — SENNOSIDES-DOCUSATE SODIUM 8.6-50 MG PO TABS: 2.0000 | ORAL_TABLET | Freq: Two times a day (BID) | ORAL | Status: DC

## 2012-10-25 MED ORDER — TADALAFIL 20 MG PO TABS: 20.0000 mg | ORAL_TABLET | Freq: Every day | ORAL | Status: DC | PRN

## 2012-10-25 NOTE — Assessment & Plan Note (Addendum)
Status post gastric bypass. Restart conservatively with Senokot-S, MiraLax, enema. With cold intolerance am also checking some TFTs. He will return in a few weeks and if no better I will need a CT scan with oral contrast of his abdomen and pelvis.

## 2012-10-25 NOTE — Assessment & Plan Note (Signed)
Cialis samples given. 

## 2012-10-25 NOTE — Progress Notes (Signed)
Subjective:    CC: Cold intolerance  HPI: This is a pleasant 62 year-old gentleman who presents with several months of cold intolerance as well as about three weeks of malaise, low appetite, nausea, and insomnia. He also complains of constipation for the past three days, though he is passing flatus. He reports normal energy levels and denies depressed mood. He usually does enemas however he has held off as he wanted to see if this was okay with me first. Symptoms are persistent, there is no pain.  Erectile dysfunction: Usually gets Cialis, this is worked the best for him. Would like some samples if able.  Review of Systems: No fevers, chills, night sweats, weight change, pica symptoms, chest pain, or shortness of breath.  He has no personal or family history of thyroid disease.  Past medical history, Surgical history, Family history not pertinant except as noted below, Social history, Allergies, and medications have been entered into the medical record, reviewed, and no changes needed.   Objective:    General: Well Developed, well nourished, and in no acute distress.  Neuro: Alert and oriented x3, extra-ocular muscles intact, sensation grossly intact. Reflexes 2+ and symmetric throughout. HEENT: Normocephalic, atraumatic, pupils equal round reactive to light, neck supple, no lymphadenopathy, thyroid nonpalpable. Mild oropharyngeal pallor noted. Skin: Warm and dry, no rashes. Cardiac: Regular rate and rhythm, no murmurs rubs or gallops.  Respiratory: Clear to auscultation bilaterally. Not using accessory muscles, speaking in full sentences. Abdominal: Soft, very mild epigastric tenderness, nondistended, hypoactive bowel sounds, no masses.  Abdominal x-ray was reviewed, there is moderate stool throughout the colon but no signs of obstruction or free air. Impression and Recommendations:   I spent 40 minutes with this patient, greater than 50% was face-to-face time counseling regarding erectile  dysfunction and constipation.

## 2012-10-26 LAB — T4, FREE: Free T4: 1.16 ng/dL (ref 0.80–1.80)

## 2012-10-26 LAB — T3, FREE: T3, Free: 2.9 pg/mL (ref 2.3–4.2)

## 2012-10-26 LAB — TSH: TSH: 0.835 u[IU]/mL (ref 0.350–4.500)

## 2012-10-28 LAB — TESTOSTERONE, FREE, TOTAL, SHBG
Sex Hormone Binding: 67 nmol/L (ref 13–71)
Testosterone, Free: 64.5 pg/mL (ref 47.0–244.0)
Testosterone-% Free: 1.3 % — ABNORMAL LOW (ref 1.6–2.9)
Testosterone: 505 ng/dL (ref 300–890)

## 2012-11-07 ENCOUNTER — Ambulatory Visit (INDEPENDENT_AMBULATORY_CARE_PROVIDER_SITE_OTHER): Payer: Managed Care, Other (non HMO) | Admitting: Sports Medicine

## 2012-11-07 ENCOUNTER — Encounter: Payer: Self-pay | Admitting: Sports Medicine

## 2012-11-07 VITALS — BP 118/73 | HR 78 | Wt 172.0 lb

## 2012-11-07 DIAGNOSIS — J309 Allergic rhinitis, unspecified: Secondary | ICD-10-CM

## 2012-11-07 DIAGNOSIS — G4700 Insomnia, unspecified: Secondary | ICD-10-CM

## 2012-11-07 DIAGNOSIS — J3089 Other allergic rhinitis: Secondary | ICD-10-CM | POA: Insufficient documentation

## 2012-11-07 DIAGNOSIS — Z0000 Encounter for general adult medical examination without abnormal findings: Secondary | ICD-10-CM | POA: Insufficient documentation

## 2012-11-07 DIAGNOSIS — Z299 Encounter for prophylactic measures, unspecified: Secondary | ICD-10-CM

## 2012-11-07 DIAGNOSIS — Z23 Encounter for immunization: Secondary | ICD-10-CM

## 2012-11-07 MED ORDER — FLUTICASONE PROPIONATE 50 MCG/ACT NA SUSP: NASAL | Status: DC

## 2012-11-07 MED ORDER — MELATONIN 10 MG PO TABS: 1.0000 | ORAL_TABLET | Freq: Every day | ORAL | Status: DC

## 2012-11-07 NOTE — Progress Notes (Signed)
  Subjective:    CC: Followup  HPI: Constipation: Resolved with Senokot-S, MiraLax, enemas.  Insomnia: When working was used to getting up at 4:00 AM, now he has trouble falling asleep until early in the morning, and sleeps intermittently through the day. Sleep quality is poor, and he wakes up multiple times. He discussed melatonin with his previous PCP. He does not do any physical activity before bed, and is not drinking any caffeine.  Erectile dysfunction: Insufficient response with 20 mg of Cialis. He does have a urologist and understands he may need to follow up with them for consideration of intracavernosal prostaglandins.  Rhinitis: Present year-round, has never used any nasal sprays. No sinus pressure.  Past medical history, Surgical history, Family history not pertinant except as noted below, Social history, Allergies, and medications have been entered into the medical record, reviewed, and no changes needed.   Review of Systems: No fevers, chills, night sweats, weight loss, chest pain, or shortness of breath.   Objective:    General: Well Developed, well nourished, and in no acute distress.  Neuro: Alert and oriented x3, extra-ocular muscles intact, sensation grossly intact.  HEENT: Normocephalic, atraumatic, pupils equal round reactive to light, neck supple, no masses, no lymphadenopathy, thyroid nonpalpable.  Skin: Warm and dry, no rashes. Cardiac: Regular rate and rhythm, no murmurs rubs or gallops.  Respiratory: Clear to auscultation bilaterally. Not using accessory muscles, speaking in full sentences. Impression and Recommendations:

## 2012-11-07 NOTE — Assessment & Plan Note (Signed)
Melatonin 10 mg at 8 PM. We can bump up to Ambien if no effect in a month or so

## 2012-11-07 NOTE — Assessment & Plan Note (Signed)
Zostavax and Pneumovax given today.

## 2012-11-07 NOTE — Assessment & Plan Note (Signed)
Flonase

## 2012-12-26 ENCOUNTER — Encounter: Payer: Self-pay | Admitting: Sports Medicine

## 2012-12-26 ENCOUNTER — Ambulatory Visit (INDEPENDENT_AMBULATORY_CARE_PROVIDER_SITE_OTHER): Payer: Managed Care, Other (non HMO) | Admitting: Sports Medicine

## 2012-12-26 VITALS — BP 117/78 | HR 69 | Wt 170.0 lb

## 2012-12-26 DIAGNOSIS — J309 Allergic rhinitis, unspecified: Secondary | ICD-10-CM

## 2012-12-26 DIAGNOSIS — G4700 Insomnia, unspecified: Secondary | ICD-10-CM

## 2012-12-26 DIAGNOSIS — E1169 Type 2 diabetes mellitus with other specified complication: Secondary | ICD-10-CM

## 2012-12-26 DIAGNOSIS — J3089 Other allergic rhinitis: Secondary | ICD-10-CM

## 2012-12-26 DIAGNOSIS — N529 Male erectile dysfunction, unspecified: Secondary | ICD-10-CM

## 2012-12-26 DIAGNOSIS — N521 Erectile dysfunction due to diseases classified elsewhere: Secondary | ICD-10-CM

## 2012-12-26 NOTE — Assessment & Plan Note (Signed)
Well controlled with Flonase.

## 2012-12-26 NOTE — Assessment & Plan Note (Signed)
Still sleeping late, waking up early, but not tired during the day. Melatonin earlier, avoid excessive stimulation before bedtime as discussed. No changes.

## 2012-12-26 NOTE — Assessment & Plan Note (Signed)
Well-controlled with Cialis, and Janumet.

## 2012-12-26 NOTE — Progress Notes (Signed)
  Subjective:    CC: Followup  HPI: Insomnia: Continues to get to sleep about 2 AM after getting home from work at about 11 PM, sleeps until 4 or 5 AM, then wakes up and goes about his day. He is not sleepy, tired, or lacking concentration during the day. He tried melatonin but is taking it at about 11 PM.  Allergic rhinitis: Resolved with Flonase.  Erectile dysfunction: Resolved with Cialis.  Past medical history, Surgical history, Family history not pertinant except as noted below, Social history, Allergies, and medications have been entered into the medical record, reviewed, and no changes needed.   Review of Systems: No fevers, chills, night sweats, weight loss, chest pain, or shortness of breath.   Objective:    General: Well Developed, well nourished, and in no acute distress.  Neuro: Alert and oriented x3, extra-ocular muscles intact, sensation grossly intact.  HEENT: Normocephalic, atraumatic, pupils equal round reactive to light, neck supple, no masses, no lymphadenopathy, thyroid nonpalpable.  Skin: Warm and dry, no rashes. Cardiac: Regular rate and rhythm, no murmurs rubs or gallops, no lower extremity edema.  Respiratory: Clear to auscultation bilaterally. Not using accessory muscles, speaking in full sentences.  Impression and Recommendations:

## 2013-01-20 ENCOUNTER — Other Ambulatory Visit: Payer: Self-pay | Admitting: Sports Medicine

## 2013-02-20 ENCOUNTER — Other Ambulatory Visit: Payer: Self-pay

## 2013-04-16 ENCOUNTER — Telehealth: Payer: Self-pay | Admitting: *Deleted

## 2013-04-16 NOTE — Telephone Encounter (Signed)
I saw this refill request come across my desk, I filled it out and sent it back to the pharmacy, see if the pharmacy has any idea what it is?

## 2013-04-16 NOTE — Telephone Encounter (Signed)
I contacted the patient he is going to contact the pharmacy that filled the Rx and have them to fax [place] a Rx refill so that we will have the name and correct dosage information. Rhonda Cunningham,CMA

## 2013-04-16 NOTE — Telephone Encounter (Signed)
Pt has called requesting a refill on a lotion for leg pain that Dr. Thurmond Butts filled for him before. He doesn't remember the name of it though and I don't see it in chart. Please advise.  Meyer Cory, LPN

## 2013-04-16 NOTE — Telephone Encounter (Signed)
James Loser, Do you know anything about this rx? Meyer Cory, LPN

## 2013-04-18 ENCOUNTER — Other Ambulatory Visit: Payer: Self-pay | Admitting: Sports Medicine

## 2013-04-18 ENCOUNTER — Telehealth: Payer: Self-pay

## 2013-04-18 MED ORDER — AMBULATORY NON FORMULARY MEDICATION: Status: DC

## 2013-04-18 NOTE — Telephone Encounter (Signed)
Left message on patient mvm to call me back regarding Rx lotion. Pearlie Lafosse,CMA

## 2013-05-01 ENCOUNTER — Telehealth: Payer: Self-pay | Admitting: *Deleted

## 2013-05-01 NOTE — Telephone Encounter (Signed)
Give him one of our discount cards, that will likely get for him a lot cheaper.

## 2013-05-01 NOTE — Telephone Encounter (Signed)
Pt called & states that the janumet is $75 & wants to know if there is something cheaper for him?

## 2013-05-02 MED ORDER — METFORMIN HCL ER (MOD) 1000 MG PO TB24: 2000.0000 mg | ORAL_TABLET | Freq: Every day | ORAL | Status: DC

## 2013-05-02 MED ORDER — SITAGLIPTIN PHOSPHATE 100 MG PO TABS: 100.0000 mg | ORAL_TABLET | Freq: Every day | ORAL | Status: DC

## 2013-05-02 NOTE — Telephone Encounter (Signed)
Pt has discount card already

## 2013-05-02 NOTE — Telephone Encounter (Signed)
Pt is amendable to this.

## 2013-05-02 NOTE — Telephone Encounter (Signed)
Can split this up and to Januvia and metformin separately if the discount card is not working for him. Would he be amenable to this?

## 2013-05-02 NOTE — Telephone Encounter (Signed)
Done, prescriptions for both sent separately.

## 2013-05-09 ENCOUNTER — Other Ambulatory Visit: Payer: Self-pay

## 2013-05-09 MED ORDER — AMBULATORY NON FORMULARY MEDICATION: Status: DC

## 2013-05-15 ENCOUNTER — Telehealth: Payer: Self-pay | Admitting: *Deleted

## 2013-05-15 NOTE — Telephone Encounter (Signed)
Pt does not need rx called in.  He picked it up today.  Preesh!!

## 2013-05-15 NOTE — Telephone Encounter (Signed)
No problemo duuude.

## 2013-05-15 NOTE — Telephone Encounter (Signed)
Pt calls & states that he saw sitagliptin on his mychart but states that he does not have this med.  There is metformin on his med list as well; is he suppose to be taking both? Please advise

## 2013-05-15 NOTE — Telephone Encounter (Signed)
He just called and said he would like to split up his Janumet into Januvia and metformin, yes he should be on metformin and sitagliptin (aka Januvia).  Does he need this called in again?

## 2013-05-15 NOTE — Telephone Encounter (Signed)
Left LMOM informing pt & asked him to call back to let me know if he needs sitagliptin called in.

## 2013-06-19 ENCOUNTER — Other Ambulatory Visit: Payer: Self-pay

## 2013-06-30 ENCOUNTER — Ambulatory Visit (INDEPENDENT_AMBULATORY_CARE_PROVIDER_SITE_OTHER): Payer: Managed Care, Other (non HMO) | Admitting: Sports Medicine

## 2013-06-30 ENCOUNTER — Encounter: Payer: Self-pay | Admitting: Sports Medicine

## 2013-06-30 VITALS — BP 122/75 | HR 76 | Wt 164.0 lb

## 2013-06-30 DIAGNOSIS — E119 Type 2 diabetes mellitus without complications: Secondary | ICD-10-CM

## 2013-06-30 DIAGNOSIS — J3089 Other allergic rhinitis: Secondary | ICD-10-CM

## 2013-06-30 DIAGNOSIS — J309 Allergic rhinitis, unspecified: Secondary | ICD-10-CM

## 2013-06-30 DIAGNOSIS — E1169 Type 2 diabetes mellitus with other specified complication: Secondary | ICD-10-CM

## 2013-06-30 DIAGNOSIS — N529 Male erectile dysfunction, unspecified: Secondary | ICD-10-CM

## 2013-06-30 LAB — LIPID PANEL
Cholesterol: 157 mg/dL (ref 0–200)
HDL: 82 mg/dL (ref >39)
LDL Cholesterol: 67 mg/dL (ref 0–99)
Total CHOL/HDL Ratio: 1.9 ratio
Triglycerides: 39 mg/dL (ref <150)
VLDL: 8 mg/dL (ref 0–40)

## 2013-06-30 LAB — COMPREHENSIVE METABOLIC PANEL
ALT: 11 U/L (ref 0–53)
BUN: 21 mg/dL (ref 6–23)
CO2: 30 mEq/L (ref 19–32)
Calcium: 9.7 mg/dL (ref 8.4–10.5)
Chloride: 101 mEq/L (ref 96–112)
Creat: 1.31 mg/dL (ref 0.50–1.35)
Glucose, Bld: 102 mg/dL — ABNORMAL HIGH (ref 70–99)
Total Bilirubin: 0.4 mg/dL (ref 0.3–1.2)

## 2013-06-30 LAB — CBC
HCT: 37.6 % — ABNORMAL LOW (ref 39.0–52.0)
Hemoglobin: 12.7 g/dL — ABNORMAL LOW (ref 13.0–17.0)
MCH: 29.3 pg (ref 26.0–34.0)
MCHC: 33.8 g/dL (ref 30.0–36.0)
MCV: 86.6 fL (ref 78.0–100.0)
Platelets: 258 K/uL (ref 150–400)
RBC: 4.34 MIL/uL (ref 4.22–5.81)
RDW: 14.6 % (ref 11.5–15.5)
WBC: 4.6 10*3/uL (ref 4.0–10.5)

## 2013-06-30 LAB — COMPREHENSIVE METABOLIC PANEL WITH GFR
AST: 13 U/L (ref 0–37)
Albumin: 4.3 g/dL (ref 3.5–5.2)
Alkaline Phosphatase: 59 U/L (ref 39–117)
Potassium: 4.7 meq/L (ref 3.5–5.3)
Sodium: 136 meq/L (ref 135–145)
Total Protein: 7.5 g/dL (ref 6.0–8.3)

## 2013-06-30 LAB — HEMOGLOBIN A1C
Hgb A1c MFr Bld: 5.9 % — ABNORMAL HIGH (ref <5.7)
Mean Plasma Glucose: 123 mg/dL — ABNORMAL HIGH (ref <117)

## 2013-06-30 MED ORDER — SITAGLIP PHOS-METFORMIN HCL ER 100-1000 MG PO TB24: 1.0000 | ORAL_TABLET | Freq: Every day | ORAL | Status: DC

## 2013-06-30 MED ORDER — TRIAMCINOLONE ACETONIDE 55 MCG/ACT NA AERO: 2.0000 | INHALATION_SPRAY | Freq: Every day | NASAL | Status: DC

## 2013-06-30 NOTE — Assessment & Plan Note (Signed)
Checking routine blood work. Giving samples of Janumet XR.

## 2013-06-30 NOTE — Progress Notes (Signed)
  Subjective:    CC: Followup  HPI: Diabetes mellitus type 2: Well controlled, desires to switch back to Januvia/metformin combination. No adverse effects, checks his feet every day, did have a recent ophthalmologist visit. No complaints.  Allergic rhinitis: Control extremely well with Flonase initially, it is now lost its effectiveness.  Erectile dysfunction: Well controlled on Cialis, needs refills.  Past medical history, Surgical history, Family history not pertinant except as noted below, Social history, Allergies, and medications have been entered into the medical record, reviewed, and no changes needed.   Review of Systems: No fevers, chills, night sweats, weight loss, chest pain, or shortness of breath.   Objective:    General: Well Developed, well nourished, and in no acute distress.  Neuro: Alert and oriented x3, extra-ocular muscles intact, sensation grossly intact.  HEENT: Normocephalic, atraumatic, pupils equal round reactive to light, neck supple, no masses, no lymphadenopathy, thyroid nonpalpable.  Skin: Warm and dry, no rashes. Cardiac: Regular rate and rhythm, no murmurs rubs or gallops, no lower extremity edema.  Respiratory: Clear to auscultation bilaterally. Not using accessory muscles, speaking in full sentences.  Impression and Recommendations:

## 2013-06-30 NOTE — Assessment & Plan Note (Signed)
Stable on Cialis. 20 mg samples given today.

## 2013-06-30 NOTE — Assessment & Plan Note (Signed)
Flonase was initially effective. Ineffective now, switching to Nasacort.

## 2013-07-01 LAB — MICROALBUMIN / CREATININE URINE RATIO
Creatinine, Urine: 233.8 mg/dL
Microalb Creat Ratio: 3.1 mg/g (ref 0.0–30.0)
Microalb, Ur: 0.72 mg/dL (ref 0.00–1.89)

## 2013-07-03 ENCOUNTER — Ambulatory Visit (INDEPENDENT_AMBULATORY_CARE_PROVIDER_SITE_OTHER): Payer: Managed Care, Other (non HMO) | Admitting: Sports Medicine

## 2013-07-03 DIAGNOSIS — Z299 Encounter for prophylactic measures, unspecified: Secondary | ICD-10-CM

## 2013-07-03 DIAGNOSIS — Z23 Encounter for immunization: Secondary | ICD-10-CM

## 2013-07-03 NOTE — Assessment & Plan Note (Signed)
Flu shot as above. 

## 2013-07-03 NOTE — Progress Notes (Signed)
Flu shot. I was present for all essential parts of this visit and procedure. [name]. Benjamin Stain, M.D.

## 2013-09-01 ENCOUNTER — Ambulatory Visit (INDEPENDENT_AMBULATORY_CARE_PROVIDER_SITE_OTHER): Payer: Managed Care, Other (non HMO) | Admitting: Sports Medicine

## 2013-09-01 ENCOUNTER — Encounter: Payer: Self-pay | Admitting: Sports Medicine

## 2013-09-01 VITALS — BP 118/70 | HR 91 | Temp 101.3°F | Wt 174.0 lb

## 2013-09-01 DIAGNOSIS — J101 Influenza due to other identified influenza virus with other respiratory manifestations: Secondary | ICD-10-CM | POA: Insufficient documentation

## 2013-09-01 DIAGNOSIS — R059 Cough, unspecified: Secondary | ICD-10-CM

## 2013-09-01 DIAGNOSIS — J111 Influenza due to unidentified influenza virus with other respiratory manifestations: Secondary | ICD-10-CM

## 2013-09-01 DIAGNOSIS — R05 Cough: Secondary | ICD-10-CM

## 2013-09-01 LAB — POCT INFLUENZA A/B
Influenza A, POC: POSITIVE
Influenza B, POC: NEGATIVE

## 2013-09-01 MED ORDER — HYDROCOD POLST-CHLORPHEN POLST 10-8 MG/5ML PO LQCR: 5.0000 mL | Freq: Two times a day (BID) | ORAL | Status: DC | PRN

## 2013-09-01 MED ORDER — KETOROLAC TROMETHAMINE 30 MG/ML IJ SOLN
30.0000 mg | Freq: Once | INTRAMUSCULAR | Status: AC
  Administered 2013-09-01: 30 mg via INTRAMUSCULAR

## 2013-09-01 MED ORDER — NAPROXEN 500 MG PO TABS: 500.0000 mg | ORAL_TABLET | Freq: Two times a day (BID) | ORAL | Status: DC

## 2013-09-01 MED ORDER — OSELTAMIVIR PHOSPHATE 75 MG PO CAPS: 75.0000 mg | ORAL_CAPSULE | Freq: Two times a day (BID) | ORAL | Status: DC

## 2013-09-01 NOTE — Patient Instructions (Signed)

## 2013-09-01 NOTE — Addendum Note (Signed)
Addended by: Doree Albee on: 09/01/2013 05:02 PM   Modules accepted: Orders

## 2013-09-01 NOTE — Progress Notes (Signed)
  Subjective:    CC: Sick  HPI: Today's of muscle aches, body aches, fevers, chills, cough, headache. Multiple sick contacts with influenza at work. He did get the flu vaccine this year. Symptoms are moderate, persistent. No shortness of breath. No chest pain.  Past medical history, Surgical history, Family history not pertinant except as noted below, Social history, Allergies, and medications have been entered into the medical record, reviewed, and no changes needed.   Review of Systems: No fevers, chills, night sweats, weight loss, chest pain, or shortness of breath.   Objective:    General: Well Developed, well nourished, and in no acute distress.  Neuro: Alert and oriented x3, extra-ocular muscles intact, sensation grossly intact.  HEENT: Normocephalic, atraumatic, pupils equal round reactive to light, neck supple, no masses, no lymphadenopathy, thyroid nonpalpable. Oropharynx, nasopharynx, external ear canals are unremarkable. Skin: Warm and dry, no rashes. Cardiac: Regular rate and rhythm, no murmurs rubs or gallops, no lower extremity edema.  Respiratory: Clear to auscultation bilaterally. Not using accessory muscles, speaking in full sentences.  Influenza A test positive.  Impression and Recommendations:

## 2013-09-01 NOTE — Assessment & Plan Note (Addendum)
With a positive flu test. Toradol 30 mg intramuscular. Naproxen 500 twice a day, Tamiflu. Tussionex as needed for cough. Return as needed. Out of work for 3 days.

## 2013-09-04 ENCOUNTER — Telehealth: Payer: Self-pay

## 2013-09-04 NOTE — Telephone Encounter (Signed)
Patient has been notified that letter was ready for pickup. Hendrix Yurkovich,CMA

## 2013-09-04 NOTE — Telephone Encounter (Signed)
Letter in box. 

## 2013-09-04 NOTE — Telephone Encounter (Signed)
Patient wife called stated that the patient was seen for flu he  is just starting to feel a little better she wants to know if he can get a letter excusing him from work for the rest of the week. Rhonda Cunningham,CMA

## 2013-09-10 ENCOUNTER — Telehealth: Payer: Self-pay

## 2013-09-10 MED ORDER — BENZONATATE 200 MG PO CAPS: 200.0000 mg | ORAL_CAPSULE | Freq: Three times a day (TID) | ORAL | Status: DC | PRN

## 2013-09-10 NOTE — Telephone Encounter (Signed)
Patient spouse called stated that patient went back to work he has the cough again and it is worse she wants to know if he can get something called in for the cough or do the patient need to come back in for a visit. Shaolin Armas,CMA

## 2013-09-10 NOTE — Telephone Encounter (Signed)
Spoke to patient spouse advised her that James Frye was sent to [facility]. Rhonda Cunningham,CMA

## 2013-09-10 NOTE — Telephone Encounter (Signed)
Sending him [employer]

## 2013-10-06 MED ORDER — TADALAFIL 5 MG PO TABS: 5.0000 mg | ORAL_TABLET | Freq: Every day | ORAL | Status: DC | PRN

## 2013-10-06 NOTE — Addendum Note (Signed)
Addended by: Silverio Decamp on: 10/06/2013 10:48 AM   Modules accepted: Orders

## 2013-11-10 ENCOUNTER — Other Ambulatory Visit: Payer: Self-pay | Admitting: Sports Medicine

## 2013-12-24 ENCOUNTER — Ambulatory Visit (INDEPENDENT_AMBULATORY_CARE_PROVIDER_SITE_OTHER): Payer: Managed Care, Other (non HMO) | Admitting: Sports Medicine

## 2013-12-24 ENCOUNTER — Encounter: Payer: Self-pay | Admitting: Sports Medicine

## 2013-12-24 ENCOUNTER — Ambulatory Visit (INDEPENDENT_AMBULATORY_CARE_PROVIDER_SITE_OTHER): Payer: Managed Care, Other (non HMO)

## 2013-12-24 VITALS — BP 119/74 | HR 70 | Ht 67.0 in | Wt 174.0 lb

## 2013-12-24 DIAGNOSIS — M25552 Pain in left hip: Secondary | ICD-10-CM | POA: Insufficient documentation

## 2013-12-24 DIAGNOSIS — M25559 Pain in unspecified hip: Secondary | ICD-10-CM

## 2013-12-24 MED ORDER — TRAMADOL HCL 50 MG PO TABS: ORAL_TABLET | ORAL | Status: DC

## 2013-12-24 MED ORDER — MELOXICAM 15 MG PO TABS: ORAL_TABLET | ORAL | Status: DC

## 2013-12-24 NOTE — Progress Notes (Signed)
  Subjective:    CC: Left hip pain  HPI: This is a very pleasant 63 year old male, he washed his truck a week ago, and had some pain that he localized in his left groin, moderate, persistent. He took some Tylenol which was ineffective.  Past medical history, Surgical history, Family history not pertinant except as noted below, Social history, Allergies, and medications have been entered into the medical record, reviewed, and no changes needed.   Review of Systems: No fevers, chills, night sweats, weight loss, chest pain, or shortness of breath.   Objective:    General: Well Developed, well nourished, and in no acute distress.  Neuro: Alert and oriented x3, extra-ocular muscles intact, sensation grossly intact.  HEENT: Normocephalic, atraumatic, pupils equal round reactive to light, neck supple, no masses, no lymphadenopathy, thyroid nonpalpable.  Skin: Warm and dry, no rashes. Cardiac: Regular rate and rhythm, no murmurs rubs or gallops, no lower extremity edema.  Respiratory: Clear to auscultation bilaterally. Not using accessory muscles, speaking in full sentences. Left Hip: ROM IR: 35 Deg, ER: 45 Deg, Flexion: 120 Deg, Extension: 100 Deg, Abduction: 45 Deg, Adduction: 59 Deg, there is slight reproduction of pain with internal rotation of the hip. Strength IR: 5/5, ER: 5/5, Flexion: 5/5, Extension: 5/5, Abduction: 5/5, Adduction: 5/5, reproduction of pain with resisted hip flexion. Pelvic alignment unremarkable to inspection and palpation. Standing hip rotation and gait without trendelenburg sign / unsteadiness. Greater trochanter without tenderness to palpation. No tenderness over piriformis. No pain with FABER or FADIR. No SI joint tenderness and normal minimal SI movement.  Hip x-rays are unremarkable.  Impression and Recommendations:

## 2013-12-24 NOTE — Assessment & Plan Note (Signed)
Suspect hip flexor strain versus exacerbation of pre-existing osteoarthritis. Mobic, tramadol, hip flexor rehabilitation, x-rays. Return next week, if still having pain we can inject his hip.

## 2013-12-29 ENCOUNTER — Ambulatory Visit: Payer: Self-pay | Admitting: Sports Medicine

## 2013-12-31 ENCOUNTER — Encounter: Payer: Self-pay | Admitting: Sports Medicine

## 2013-12-31 ENCOUNTER — Ambulatory Visit (INDEPENDENT_AMBULATORY_CARE_PROVIDER_SITE_OTHER): Payer: Managed Care, Other (non HMO) | Admitting: Sports Medicine

## 2013-12-31 VITALS — BP 136/83 | HR 75 | Ht 67.0 in | Wt 172.0 lb

## 2013-12-31 DIAGNOSIS — M25559 Pain in unspecified hip: Secondary | ICD-10-CM

## 2013-12-31 DIAGNOSIS — M25552 Pain in left hip: Secondary | ICD-10-CM

## 2013-12-31 NOTE — Assessment & Plan Note (Signed)
Hip flexor strain has resolved with conservative measures.

## 2013-12-31 NOTE — Progress Notes (Signed)
  Subjective:    CC: Followup  HPI: Left hip pain: diagnosed with flexor strain at the last visit, pain-free after 2 weeks of rehabilitation and NSAIDs.  Past medical history, Surgical history, Family history not pertinant except as noted below, Social history, Allergies, and medications have been entered into the medical record, reviewed, and no changes needed.   Review of Systems: No fevers, chills, night sweats, weight loss, chest pain, or shortness of breath.   Objective:    General: Well Developed, well nourished, and in no acute distress.  Neuro: Alert and oriented x3, extra-ocular muscles intact, sensation grossly intact.  HEENT: Normocephalic, atraumatic, pupils equal round reactive to light, neck supple, no masses, no lymphadenopathy, thyroid nonpalpable.  Skin: Warm and dry, no rashes. Cardiac: Regular rate and rhythm, no murmurs rubs or gallops, no lower extremity edema.  Respiratory: Clear to auscultation bilaterally. Not using accessory muscles, speaking in full sentences. Left Hip: ROM IR: 45 Deg, ER: 45 Deg, Flexion: 120 Deg, Extension: 100 Deg, Abduction: 45 Deg, Adduction: 45 Deg Strength IR: 5/5, ER: 5/5, Flexion: 5/5, Extension: 5/5, Abduction: 5/5, Adduction: 5/5 Pelvic alignment unremarkable to inspection and palpation. Standing hip rotation and gait without trendelenburg sign / unsteadiness. Greater trochanter without tenderness to palpation. No tenderness over piriformis. No pain with FABER or FADIR. No SI joint tenderness and normal minimal SI movement.  Impression and Recommendations:

## 2014-02-05 ENCOUNTER — Encounter: Payer: Self-pay | Admitting: Sports Medicine

## 2014-02-19 ENCOUNTER — Other Ambulatory Visit: Payer: Self-pay | Admitting: Sports Medicine

## 2014-02-23 ENCOUNTER — Emergency Department (HOSPITAL_BASED_OUTPATIENT_CLINIC_OR_DEPARTMENT_OTHER)
Admission: EM | Admit: 2014-02-23 | Discharge: 2014-02-23 | Disposition: A | Discharge status: Home / Self Care | Payer: Worker's Compensation | Attending: Emergency Medicine | Admitting: Emergency Medicine

## 2014-02-23 ENCOUNTER — Encounter (HOSPITAL_BASED_OUTPATIENT_CLINIC_OR_DEPARTMENT_OTHER): Payer: Self-pay | Admitting: Emergency Medicine

## 2014-02-23 DIAGNOSIS — Z23 Encounter for immunization: Secondary | ICD-10-CM | POA: Insufficient documentation

## 2014-02-23 DIAGNOSIS — S81012A Laceration without foreign body, left knee, initial encounter: Secondary | ICD-10-CM

## 2014-02-23 DIAGNOSIS — Z791 Long term (current) use of non-steroidal anti-inflammatories (NSAID): Secondary | ICD-10-CM | POA: Insufficient documentation

## 2014-02-23 DIAGNOSIS — Y990 Civilian activity done for income or pay: Secondary | ICD-10-CM | POA: Insufficient documentation

## 2014-02-23 DIAGNOSIS — E119 Type 2 diabetes mellitus without complications: Secondary | ICD-10-CM | POA: Insufficient documentation

## 2014-02-23 DIAGNOSIS — Z880 Allergy status to penicillin: Secondary | ICD-10-CM | POA: Insufficient documentation

## 2014-02-23 DIAGNOSIS — Z9884 Bariatric surgery status: Secondary | ICD-10-CM | POA: Insufficient documentation

## 2014-02-23 DIAGNOSIS — S81009A Unspecified open wound, unspecified knee, initial encounter: Secondary | ICD-10-CM | POA: Insufficient documentation

## 2014-02-23 DIAGNOSIS — S91009A Unspecified open wound, unspecified ankle, initial encounter: Principal | ICD-10-CM

## 2014-02-23 DIAGNOSIS — Y9289 Other specified places as the place of occurrence of the external cause: Secondary | ICD-10-CM | POA: Insufficient documentation

## 2014-02-23 DIAGNOSIS — W268XXA Contact with other sharp object(s), not elsewhere classified, initial encounter: Secondary | ICD-10-CM | POA: Insufficient documentation

## 2014-02-23 DIAGNOSIS — IMO0002 Reserved for concepts with insufficient information to code with codable children: Secondary | ICD-10-CM | POA: Insufficient documentation

## 2014-02-23 DIAGNOSIS — Z87891 Personal history of nicotine dependence: Secondary | ICD-10-CM | POA: Insufficient documentation

## 2014-02-23 DIAGNOSIS — S81809A Unspecified open wound, unspecified lower leg, initial encounter: Principal | ICD-10-CM

## 2014-02-23 DIAGNOSIS — Y9389 Activity, other specified: Secondary | ICD-10-CM | POA: Insufficient documentation

## 2014-02-23 DIAGNOSIS — Z79899 Other long term (current) drug therapy: Secondary | ICD-10-CM | POA: Insufficient documentation

## 2014-02-23 MED ORDER — TETANUS-DIPHTH-ACELL PERTUSSIS 5-2.5-18.5 LF-MCG/0.5 IM SUSP
0.5000 mL | Freq: Once | INTRAMUSCULAR | Status: AC
  Administered 2014-02-23: 0.5 mL via INTRAMUSCULAR
  Filled 2014-02-23: qty 0.5

## 2014-02-23 NOTE — ED Notes (Signed)
Pt was working and hit leg on a large metal electrical box and caused a laceration to his left knee

## 2014-02-23 NOTE — ED Provider Notes (Signed)
CSN: [id number]     Arrival date & time 02/23/14  1913 History   First MD Initiated Contact with Patient 02/23/14 2043     Chief Complaint  Patient presents with  . Extremity Laceration     (Consider location/radiation/quality/duration/timing/severity/associated sxs/prior Treatment) HPI Comments: Pt states that he was at work a short time ago and he was dropping and metal box and it cut his right knee. Denies any problems with sensation or movement. Unsure of when last tetanus was. No concern for foreign body  The history is provided by the patient. No language interpreter was used.    Past Medical History  Diagnosis Date  . Diabetes mellitus    Past Surgical History  Procedure Laterality Date  . Gastric bypass     Family History  Problem Relation Age of Onset  . Cancer Mother   . Stroke Father   . Heart disease Father    History  Substance Use Topics  . Smoking status: Former Research scientist (life sciences)  . Smokeless tobacco: Never Used  . Alcohol Use: No    Review of Systems  Constitutional: Negative.   Respiratory: Negative.   Cardiovascular: Negative.       Allergies  Penicillins  Home Medications   Prior to Admission medications   Medication Sig Start Date End Date Taking? Authorizing Provider  fluticasone (FLONASE) 50 MCG/ACT nasal spray USE ONE SPRAY IN EACH NOSTRIL TWICE DAILY, USE LEFT HAND FOR RIGHT NOSTRIL, AND RIGHT HAND FOR LEFT NOSTRIL. 11/10/13  Yes Silverio Decamp, MD  JANUMET XR [phone] MG TB24 TAKE ONE TABLET BY MOUTH ONCE DAILY 02/19/14  Yes Silverio Decamp, MD  tadalafil (CIALIS) 5 MG tablet Take 1 tablet (5 mg total) by mouth daily as needed. 10/06/13  Yes Silverio Decamp, MD  triamcinolone (NASACORT) 55 MCG/ACT AERO nasal inhaler Place 2 sprays into the nose daily. 06/30/13  Yes Silverio Decamp, MD  AMBULATORY NON FORMULARY MEDICATION Flurbiprofen 10%, baclofen 2%, cyclobenzaprine 2%, gabapentin 6%, lidocaine 2% cream. Apply 1-2 g to  affected area 3-4 times daily. 05/09/13   Silverio Decamp, MD  meloxicam (MOBIC) 15 MG tablet One tab PO qAM with breakfast for 2 weeks, then daily prn pain. 12/24/13   Silverio Decamp, MD  naproxen (NAPROSYN) 500 MG tablet Take 1 tablet (500 mg total) by mouth 2 (two) times daily with a meal. 09/01/13   Silverio Decamp, MD  omeprazole (PRILOSEC) 40 MG capsule Take 1 capsule (40 mg total) by mouth daily. Take at dinnertime. 10/09/12   Silverio Decamp, MD  traMADol (ULTRAM) 50 MG tablet 1-2 tabs by mouth Q8 hours, maximum 6 tabs per day. 12/24/13   Silverio Decamp, MD   BP 123/77  Pulse 73  Temp(Src) 99.2 F (37.3 C) (Oral)  Resp 18  Ht 5\' 7"  (1.702 m)  Wt 171 lb (77.565 kg)  BMI 26.78 kg/m2  SpO2 98% Physical Exam  Nursing note and vitals reviewed. Constitutional: He is oriented to person, place, and time. He appears well-developed and well-nourished.  Cardiovascular: Normal rate and regular rhythm.   Pulmonary/Chest: Effort normal and breath sounds normal.  Musculoskeletal: Normal range of motion.  Neurological: He is alert and oriented to person, place, and time. Coordination normal.  Skin:  3 cm laceration to the right knee. Pt has full rom    ED Course  LACERATION REPAIR Date/Time: 02/23/2014 9:31 PM Performed by: Glendell Docker Authorized by: Glendell Docker Consent: Verbal consent obtained. Risks and benefits: risks,  benefits and alternatives were discussed Consent given by: patient Patient identity confirmed: verbally with patient Body area: lower extremity Location details: right knee Laceration length: 3 cm Foreign bodies: no foreign bodies Anesthesia: local infiltration Local anesthetic: lidocaine 1% without epinephrine Irrigation solution: saline Skin closure: 3-0 Prolene Number of sutures: 5 Technique: simple Approximation: close Approximation difficulty: simple Patient tolerance: Patient tolerated the procedure well with no  immediate complications.   (including critical care time) Labs Review Labs Reviewed - No data to display  Imaging Review No results found.   EKG Interpretation None      MDM   Final diagnoses:  Knee laceration, left, initial encounter    Wound closed without any problem. Mechanism not concerning for fracture or fb. Pt instructed on wound care    Glendell Docker, NP 02/23/14 2132

## 2014-02-23 NOTE — ED Notes (Signed)
Bleeding controlled,

## 2014-02-23 NOTE — Discharge Instructions (Signed)

## 2014-02-23 NOTE — ED Provider Notes (Signed)
Medical screening examination/treatment/procedure(s) were performed by non-physician practitioner and as supervising physician I was immediately available for consultation/collaboration.  Neta Ehlers, MD 02/23/14 [phone]

## 2014-02-24 ENCOUNTER — Ambulatory Visit: Payer: Self-pay | Admitting: Sports Medicine

## 2014-03-09 ENCOUNTER — Ambulatory Visit (INDEPENDENT_AMBULATORY_CARE_PROVIDER_SITE_OTHER): Payer: Self-pay

## 2014-03-09 ENCOUNTER — Other Ambulatory Visit: Payer: Self-pay | Admitting: Emergency Medicine

## 2014-03-09 DIAGNOSIS — M25569 Pain in unspecified knee: Secondary | ICD-10-CM

## 2014-03-09 DIAGNOSIS — R52 Pain, unspecified: Secondary | ICD-10-CM

## 2014-03-24 ENCOUNTER — Other Ambulatory Visit: Payer: Self-pay | Admitting: Sports Medicine

## 2014-04-28 ENCOUNTER — Encounter: Payer: Self-pay | Admitting: Family Medicine

## 2014-04-28 ENCOUNTER — Ambulatory Visit (INDEPENDENT_AMBULATORY_CARE_PROVIDER_SITE_OTHER): Payer: Managed Care, Other (non HMO) | Admitting: Family Medicine

## 2014-04-28 VITALS — BP 143/75 | HR 72 | Temp 98.2°F | Wt 175.0 lb

## 2014-04-28 DIAGNOSIS — J329 Chronic sinusitis, unspecified: Secondary | ICD-10-CM

## 2014-04-28 DIAGNOSIS — A499 Bacterial infection, unspecified: Secondary | ICD-10-CM

## 2014-04-28 DIAGNOSIS — B9689 Other specified bacterial agents as the cause of diseases classified elsewhere: Secondary | ICD-10-CM

## 2014-04-28 MED ORDER — DOXYCYCLINE HYCLATE 100 MG PO TABS: ORAL_TABLET | ORAL | Status: AC

## 2014-04-28 NOTE — Progress Notes (Signed)
CC: James Frye is a 63 y.o. male is here for not feeling well   Subjective: HPI:  Frontal headache, nasal congestion, myalgias, subjective postnasal drip and nonproductive cough all of which have been of moderate severity since Friday. Accompanied by fevers and chills yesterday. Nothing seems to make better or worse despite trying acetaminophen. Symptoms present all hours of the day and interfered with sleep. He had to leave work yesterday due to just feeling overall sick.  Denies confusion, wheezing, shortness of breath, blood in sputum, motor or sensory disturbances, weakness.   Review Of Systems Outlined In HPI  Past Medical History  Diagnosis Date  . Diabetes mellitus     Past Surgical History  Procedure Laterality Date  . Gastric bypass     Family History  Problem Relation Age of Onset  . Cancer Mother   . Stroke Father   . Heart disease Father     History   Social History  . Marital Status: Married    Spouse Name: N/A    Number of Children: N/A  . Years of Education: N/A   Occupational History  . Not on file.   Social History Main Topics  . Smoking status: Former Research scientist (life sciences)  . Smokeless tobacco: Never Used  . Alcohol Use: No  . Drug Use: No  . Sexual Activity: Yes   Other Topics Concern  . Not on file   Social History Narrative  . No narrative on file     Objective: BP 143/75  Pulse 72  Temp(Src) 98.2 F (36.8 C) (Oral)  Wt 175 lb (79.379 kg)  General: Alert and Oriented, No Acute Distress HEENT: Pupils equal, round, reactive to light. Conjunctivae clear.  External ears unremarkable, canals clear with intact TMs with appropriate landmarks.  Middle ear appears open without effusion. Boggy erythematous inferior turbinates with moderate mucoid discharge.  Moist mucous membranes, pharynx without inflammation nor lesions however moderate cobblestoning.  Neck supple without palpable lymphadenopathy nor abnormal masses. Lungs: Clear to auscultation  bilaterally, no wheezing/ronchi/rales.  Comfortable work of breathing. Good air movement. His Extremities: No peripheral edema.  Strong peripheral pulses.  Mental Status: No depression, anxiety, nor agitation. Skin: Warm and dry.  Assessment & Plan: James Frye was seen today for not feeling well.  Diagnoses and associated orders for this visit:  Bacterial sinusitis - doxycycline (VIBRA-TABS) 100 MG tablet; One by mouth twice a day for ten days.    Bacterial sinusitis we'll start doxycycline consider Alka-Seltzer cold and sinus and nasal saline washes.  Return if symptoms worsen or fail to improve.

## 2014-05-29 ENCOUNTER — Other Ambulatory Visit: Payer: Self-pay

## 2014-07-03 ENCOUNTER — Encounter: Payer: Self-pay | Admitting: Sports Medicine

## 2014-07-03 ENCOUNTER — Ambulatory Visit (INDEPENDENT_AMBULATORY_CARE_PROVIDER_SITE_OTHER): Payer: Managed Care, Other (non HMO) | Admitting: Sports Medicine

## 2014-07-03 DIAGNOSIS — E119 Type 2 diabetes mellitus without complications: Secondary | ICD-10-CM

## 2014-07-03 DIAGNOSIS — N521 Erectile dysfunction due to diseases classified elsewhere: Secondary | ICD-10-CM

## 2014-07-03 DIAGNOSIS — R072 Precordial pain: Secondary | ICD-10-CM

## 2014-07-03 DIAGNOSIS — E1169 Type 2 diabetes mellitus with other specified complication: Secondary | ICD-10-CM

## 2014-07-03 LAB — COMPREHENSIVE METABOLIC PANEL WITH GFR
Albumin: 4.5 g/dL (ref 3.5–5.2)
BUN: 22 mg/dL (ref 6–23)
Calcium: 9.2 mg/dL (ref 8.4–10.5)
Potassium: 4.6 meq/L (ref 3.5–5.3)
Sodium: 138 meq/L (ref 135–145)
Total Protein: 7.4 g/dL (ref 6.0–8.3)

## 2014-07-03 LAB — LIPID PANEL
Cholesterol: 152 mg/dL (ref 0–200)
HDL: 80 mg/dL (ref >39)
LDL Cholesterol: 62 mg/dL (ref 0–99)
Total CHOL/HDL Ratio: 1.9 Ratio
Triglycerides: 50 mg/dL (ref <150)
VLDL: 10 mg/dL (ref 0–40)

## 2014-07-03 LAB — COMPREHENSIVE METABOLIC PANEL
ALT: 12 U/L (ref 0–53)
AST: 20 U/L (ref 0–37)
Alkaline Phosphatase: 52 U/L (ref 39–117)
CO2: 28 mEq/L (ref 19–32)
Chloride: 103 mEq/L (ref 96–112)
Creat: 1.33 mg/dL (ref 0.50–1.35)
Glucose, Bld: 90 mg/dL (ref 70–99)
Total Bilirubin: 0.6 mg/dL (ref 0.2–1.2)

## 2014-07-03 MED ORDER — TADALAFIL 5 MG PO TABS: 5.0000 mg | ORAL_TABLET | Freq: Every day | ORAL | Status: DC | PRN

## 2014-07-03 NOTE — Assessment & Plan Note (Signed)
Had an episode of exertional chest pain. He did have a negative stress test and negative cardiac catheterization approximately 3-1/2-4 years ago. He is likely low risk. We have obtained EKG as well as d-dimer in the past for this. All have been negative. Prilosec and Zantac have helped. I do want him to follow up with his cardiologist again regarding this.

## 2014-07-03 NOTE — Assessment & Plan Note (Signed)
Rechecking hemoglobin A1c. Continue current medications. Foot exam done today.

## 2014-07-03 NOTE — Progress Notes (Signed)
  Subjective:    CC: Follow-up  HPI:  Diabetes mellitus type 2: Due for a full exam and blood work.  Chest pain: 4 years ago he had a cardiac catheterization that was clear. Unfortunately had a single episode of right-sided substernal chest pain that was exertional. Chest pain has since resolved.  Erectile dysfunction: Cialis is the only thing that seems to work, needs a refill.  Past medical history, Surgical history, Family history not pertinant except as noted below, Social history, Allergies, and medications have been entered into the medical record, reviewed, and no changes needed.   Review of Systems: No fevers, chills, night sweats, weight loss, chest pain, or shortness of breath.   Objective:    General: Well Developed, well nourished, and in no acute distress.  Neuro: Alert and oriented x3, extra-ocular muscles intact, sensation grossly intact.  HEENT: Normocephalic, atraumatic, pupils equal round reactive to light, neck supple, no masses, no lymphadenopathy, thyroid nonpalpable.  Skin: Warm and dry, no rashes. Cardiac: Regular rate and rhythm, no murmurs rubs or gallops, no lower extremity edema.  Respiratory: Clear to auscultation bilaterally. Not using accessory muscles, speaking in full sentences.  Diabetic Foot Exam Both feet were examined, there are no signs of ulceration or abnormal callus. Nails are unremarkable. Dorsalis pedis and posterior tibial pulses are palpable. Sensation is intact to sharp and monofilament. Shoes are of appropriate fitment.  Impression and Recommendations:

## 2014-07-03 NOTE — Assessment & Plan Note (Signed)
Refilling Cialis. Discount coupon given.

## 2014-07-04 LAB — CBC
HCT: 37.1 % — ABNORMAL LOW (ref 39.0–52.0)
Hemoglobin: 12.3 g/dL — ABNORMAL LOW (ref 13.0–17.0)
MCH: 28.9 pg (ref 26.0–34.0)
MCHC: 33.2 g/dL (ref 30.0–36.0)
MCV: 87.3 fL (ref 78.0–100.0)
MPV: 9.5 fL (ref 9.4–12.4)
Platelets: 225 K/uL (ref 150–400)
RBC: 4.25 MIL/uL (ref 4.22–5.81)
RDW: 14.6 % (ref 11.5–15.5)
WBC: 5 K/uL (ref 4.0–10.5)

## 2014-07-04 LAB — HEMOGLOBIN A1C
Hgb A1c MFr Bld: 5.8 % — ABNORMAL HIGH (ref <5.7)
Mean Plasma Glucose: 120 mg/dL — ABNORMAL HIGH (ref <117)

## 2014-07-04 LAB — TSH: TSH: 0.606 u[IU]/mL (ref 0.350–4.500)

## 2014-07-06 LAB — TESTOSTERONE, FREE, TOTAL, SHBG
Sex Hormone Binding: 69 nmol/L (ref 13–71)
Testosterone, Free: 80.9 pg/mL (ref 47.0–244.0)
Testosterone-% Free: 1.3 % — ABNORMAL LOW (ref 1.6–2.9)
Testosterone: 626 ng/dL (ref 300–890)

## 2014-07-21 ENCOUNTER — Encounter: Payer: Self-pay | Admitting: Sports Medicine

## 2014-07-21 NOTE — Progress Notes (Signed)
     HPI: 63 yo male for evaluation of chest pain. Patient states he had a myocardial infarction at the time of gastric bypass surgery some years ago. I do not have those records available. Over the past several months he describes chest pain. It is in the right breast area. It is described as a stabbing pain. It is continuous with no associated symptoms. Not pleuritic positional or related to food. Otherwise denies exertional chest pain, dyspnea, palpitations or syncope.  Current Outpatient Prescriptions  Medication Sig Dispense Refill  . fluticasone (FLONASE) 50 MCG/ACT nasal spray USE ONE SPRAY IN EACH NOSTRIL TWICE DAILY, USE LEFT HAND FOR RIGHT NOSTRIL, AND RIGHT HAND FOR LEFT NOSTRIL. 16 g 11  . SitaGLIPtin-MetFORMIN HCl (JANUMET XR) [phone] MG TB24 Take 1 tablet by mouth daily. 90 tablet 3   No current facility-administered medications for this visit.    Allergies  Allergen Reactions  . Penicillins Rash     Past Medical History  Diagnosis Date  . Diabetes mellitus   . CAD (coronary artery disease)   . Hiatal hernia     Past Surgical History  Procedure Laterality Date  . Gastric bypass    . Appendectomy      History   Social History  . Marital Status: Married    Spouse Name: N/A    Number of Children: 3  . Years of Education: N/A   Occupational History  . Not on file.   Social History Main Topics  . Smoking status: Former Research scientist (life sciences)  . Smokeless tobacco: Never Used  . Alcohol Use: No  . Drug Use: No  . Sexual Activity: Yes   Other Topics Concern  . Not on file   Social History Narrative    Family History  Problem Relation Age of Onset  . Cancer Mother   . Stroke Father   . Heart disease Father     ROS: no fevers or chills, productive cough, hemoptysis, dysphasia, odynophagia, melena, hematochezia, dysuria, hematuria, rash, seizure activity, orthopnea, PND, pedal edema, claudication. Remaining systems are negative.  Physical Exam:   Blood pressure  140/80, pulse 73, height 5\' 9"  (1.753 m), weight 176 lb (79.833 kg).  General:  Well developed/well nourished in NAD Skin warm/dry Patient not depressed No peripheral clubbing Back-normal HEENT-normal/normal eyelids Neck supple/normal carotid upstroke bilaterally; no bruits; no JVD; no thyromegaly chest - CTA/ normal expansion CV - RRR/normal S1 and S2; no murmurs, rubs or gallops;  PMI nondisplaced Abdomen -NT/ND, no HSM, no mass, + bowel sounds, no bruit 2+ femoral pulses, no bruits Ext-no edema, chords, 2+ DP Neuro-grossly nonfocal  ECG Sinus rhythm at a rate of 73. No significant ST changes.

## 2014-07-22 ENCOUNTER — Ambulatory Visit (INDEPENDENT_AMBULATORY_CARE_PROVIDER_SITE_OTHER): Payer: Managed Care, Other (non HMO) | Admitting: Cardiology

## 2014-07-22 ENCOUNTER — Encounter: Payer: Self-pay | Admitting: Cardiology

## 2014-07-22 ENCOUNTER — Encounter: Payer: Self-pay | Admitting: *Deleted

## 2014-07-22 VITALS — BP 140/80 | HR 73 | Ht 69.0 in | Wt 176.0 lb

## 2014-07-22 DIAGNOSIS — I679 Cerebrovascular disease, unspecified: Secondary | ICD-10-CM

## 2014-07-22 DIAGNOSIS — R072 Precordial pain: Secondary | ICD-10-CM

## 2014-07-22 DIAGNOSIS — I2583 Coronary atherosclerosis due to lipid rich plaque: Secondary | ICD-10-CM

## 2014-07-22 DIAGNOSIS — I2510 Atherosclerotic heart disease of native coronary artery without angina pectoris: Secondary | ICD-10-CM

## 2014-07-22 MED ORDER — ATORVASTATIN CALCIUM 20 MG PO TABS: 20.0000 mg | ORAL_TABLET | Freq: Every day | ORAL | Status: DC

## 2014-07-22 NOTE — Assessment & Plan Note (Signed)
Patient symptoms are atypical. He has a long history of diabetes mellitus and by report and myocardial infarction in the past at the time of gastric bypass surgery. Plan exercise treadmill for risk stratification. Schedule echocardiogram to assess LV function.

## 2014-07-22 NOTE — Assessment & Plan Note (Signed)
Patient states he had a myocardial infarction or CVA at the time of his previous Gastric bypass. He cannot tolerate aspirin because of recurrent hematochezia. Add Lipitor 20 mg daily. Check lipids and liver in 6 weeks. Check carotid Dopplers.

## 2014-07-22 NOTE — Patient Instructions (Signed)
Your physician wants you to follow-up in: [place] will receive a reminder letter in the mail two months in advance. If you don't receive a letter, please call our office to schedule the follow-up appointment.   Your physician has requested that you have an exercise tolerance test. For further information please visit [email or web address]. Please also follow instruction sheet, as given.   Your physician has requested that you have an echocardiogram. Echocardiography is a painless test that uses sound waves to create images of your heart. It provides your doctor with information about the size and shape of your heart and how well your heart's chambers and valves are working. This procedure takes approximately one hour. There are no restrictions for this procedure.   START ATORVASTATIN 20 MG ONCE DAILY  Your physician recommends that you return for lab work in: [place] = DO NOT EAT PRIOR TO LAB WORK  Your physician has requested that you have a carotid duplex. This test is an ultrasound of the carotid arteries in your neck. It looks at blood flow through these arteries that supply the brain with blood. Allow one hour for this exam. There are no restrictions or special instructions.

## 2014-08-13 ENCOUNTER — Telehealth (HOSPITAL_COMMUNITY): Payer: Self-pay

## 2014-08-13 NOTE — Telephone Encounter (Signed)
Encounter complete. 

## 2014-08-19 ENCOUNTER — Ambulatory Visit (HOSPITAL_BASED_OUTPATIENT_CLINIC_OR_DEPARTMENT_OTHER)
Admission: RE | Admit: 2014-08-19 | Discharge: 2014-08-19 | Disposition: A | Discharge status: Home / Self Care | Payer: Managed Care, Other (non HMO) | Source: Ambulatory Visit | Attending: Cardiology | Admitting: Cardiology

## 2014-08-19 ENCOUNTER — Ambulatory Visit (HOSPITAL_COMMUNITY)
Admission: RE | Admit: 2014-08-19 | Discharge: 2014-08-19 | Disposition: A | Discharge status: Home / Self Care | Payer: Managed Care, Other (non HMO) | Source: Ambulatory Visit | Attending: Cardiology | Admitting: Cardiology

## 2014-08-19 DIAGNOSIS — R072 Precordial pain: Secondary | ICD-10-CM

## 2014-08-19 DIAGNOSIS — I369 Nonrheumatic tricuspid valve disorder, unspecified: Secondary | ICD-10-CM

## 2014-08-19 DIAGNOSIS — I679 Cerebrovascular disease, unspecified: Secondary | ICD-10-CM

## 2014-08-19 DIAGNOSIS — I2510 Atherosclerotic heart disease of native coronary artery without angina pectoris: Secondary | ICD-10-CM | POA: Diagnosis not present

## 2014-08-19 DIAGNOSIS — R079 Chest pain, unspecified: Secondary | ICD-10-CM | POA: Diagnosis present

## 2014-08-19 NOTE — Progress Notes (Signed)
2D Echo Performed 08/19/2014    Marygrace Drought, RCS

## 2014-08-19 NOTE — Progress Notes (Signed)
Carotid Duplex Completed. Bilateral ICAs showed mild plaque with no evidence of stenosis. Oda Cogan, BS, RDMS, RVT

## 2014-08-19 NOTE — Procedures (Signed)
Exercise Treadmill Test   Test  Exercise Tolerance Test Ordering MD: Kirk Ruths, MD  Interpreting MD:   Unique Test No: 1 Treadmill:  1  Indication for ETT: chest pain - rule out ischemia  Contraindication to ETT: No   Stress Modality: exercise - treadmill  Cardiac Imaging Performed: non   Protocol: standard Bruce - maximal  Max BP:  188/88  Max MPHR (bpm):  135 85% MPR (bpm): 159  MPHR obtained (bpm):  171 % MPHR obtained: 107  Reached 85% MPHR (min:sec):  5:05 Total Exercise Time (min-sec):  9:00  Workload in METS:  10.10 Borg Scale:   Reason ETT Terminated:  fatigue    ST Segment Analysis At Rest: normal ST segments - no evidence of significant ST depression With Exercise: no evidence of significant ST depression  Other Information Arrhythmia:  No Angina during ETT:  absent (0) Quality of ETT:  diagnostic  ETT Interpretation:  normal - no evidence of ischemia by ST analysis  Comments: Excellent exercise effort Normal BP response to exercise No ischemic EKG changes [place] Treadmill Score +9  Pixie Casino, MD, [facility] Attending Cardiologist [place]

## 2014-09-11 ENCOUNTER — Telehealth: Payer: Self-pay

## 2014-09-11 NOTE — Telephone Encounter (Signed)
Einar called and left a message for a return call.

## 2014-09-11 NOTE — Telephone Encounter (Signed)
Called and spoke with wife and and she didn't know why he called. She states she thinks he wanted to get records but got them from Dr. Stanford Breed

## 2014-10-23 LAB — HM DIABETES EYE EXAM

## 2014-11-09 ENCOUNTER — Encounter: Payer: Self-pay | Admitting: Sports Medicine

## 2014-11-30 ENCOUNTER — Encounter: Payer: Self-pay | Admitting: Sports Medicine

## 2014-12-01 ENCOUNTER — Encounter: Payer: Self-pay | Admitting: Sports Medicine

## 2014-12-01 DIAGNOSIS — Z630 Problems in relationship with spouse or partner: Secondary | ICD-10-CM

## 2014-12-02 ENCOUNTER — Telehealth: Payer: Self-pay

## 2014-12-02 DIAGNOSIS — Z630 Problems in relationship with spouse or partner: Secondary | ICD-10-CM

## 2014-12-02 NOTE — Telephone Encounter (Signed)
Patient request referral to [place]

## 2015-01-01 ENCOUNTER — Ambulatory Visit (INDEPENDENT_AMBULATORY_CARE_PROVIDER_SITE_OTHER): Payer: Managed Care, Other (non HMO) | Admitting: Sports Medicine

## 2015-01-01 ENCOUNTER — Encounter: Payer: Self-pay | Admitting: Sports Medicine

## 2015-01-01 VITALS — BP 135/82 | HR 69 | Ht 69.0 in | Wt 179.0 lb

## 2015-01-01 DIAGNOSIS — E119 Type 2 diabetes mellitus without complications: Secondary | ICD-10-CM | POA: Diagnosis not present

## 2015-01-01 DIAGNOSIS — N521 Erectile dysfunction due to diseases classified elsewhere: Secondary | ICD-10-CM

## 2015-01-01 DIAGNOSIS — Z630 Problems in relationship with spouse or partner: Secondary | ICD-10-CM

## 2015-01-01 DIAGNOSIS — E1169 Type 2 diabetes mellitus with other specified complication: Secondary | ICD-10-CM

## 2015-01-01 MED ORDER — SITAGLIP PHOS-METFORMIN HCL ER 100-1000 MG PO TB24: 1.0000 | ORAL_TABLET | Freq: Every day | ORAL | Status: DC

## 2015-01-01 NOTE — Progress Notes (Signed)
  Subjective:    CC: Follow-up  HPI: Diabetes mellitus type 2: Is doing extremely well on Janumet, his A1c's have been extremely well controlled in the past, he is due for a urine microalbumin, as well as an A1c. Needs a refill on medications and a new discount coupon.  Marital difficulties: Is on wife #2, having significant arguments, difficulties finding common ground. His personality is very outgoing, and his wife tends to become somewhat jealous. He is amenable to proceed with couples behavioral therapy.  Erectile dysfunction: Amenable to have a refill on Cialis.  Past medical history, Surgical history, Family history not pertinant except as noted below, Social history, Allergies, and medications have been entered into the medical record, reviewed, and no changes needed.   Review of Systems: No fevers, chills, night sweats, weight loss, chest pain, or shortness of breath.   Objective:    General: Well Developed, well nourished, and in no acute distress.  Neuro: Alert and oriented x3, extra-ocular muscles intact, sensation grossly intact.  HEENT: Normocephalic, atraumatic, pupils equal round reactive to light, neck supple, no masses, no lymphadenopathy, thyroid nonpalpable.  Skin: Warm and dry, no rashes. Cardiac: Regular rate and rhythm, no murmurs rubs or gallops, no lower extremity edema.  Respiratory: Clear to auscultation bilaterally. Not using accessory muscles, speaking in full sentences.  Impression and Recommendations:   I spent 40 minutes with this patient, 50% was face-to-face time counseling regarding the above diagnoses

## 2015-01-01 NOTE — Assessment & Plan Note (Signed)
Cialis samples given.

## 2015-01-01 NOTE — Assessment & Plan Note (Signed)
Referral for couples behavioral therapy.

## 2015-01-01 NOTE — Assessment & Plan Note (Signed)
Has been classically well-controlled. Refilling Janumet. New discount card given. Hemoglobin A1c and urine microalbumin today.

## 2015-01-02 LAB — MICROALBUMIN / CREATININE URINE RATIO
Creatinine, Urine: 138.1 mg/dL
Microalb Creat Ratio: 2.9 mg/g (ref 0.0–30.0)
Microalb, Ur: 0.4 mg/dL (ref <2.0)

## 2015-01-02 LAB — HEMOGLOBIN A1C
Hgb A1c MFr Bld: 6.1 % — ABNORMAL HIGH (ref <5.7)
Mean Plasma Glucose: 128 mg/dL — ABNORMAL HIGH (ref <117)

## 2015-01-06 ENCOUNTER — Telehealth: Payer: Self-pay | Admitting: Family Medicine

## 2015-01-06 NOTE — Telephone Encounter (Signed)
Patient was referred to [facility] and they have called patient and left messages to call back for appt,  They have not returned any calls. Just FYI

## 2015-04-04 ENCOUNTER — Encounter: Payer: Self-pay | Admitting: Sports Medicine

## 2015-04-04 DIAGNOSIS — E119 Type 2 diabetes mellitus without complications: Secondary | ICD-10-CM

## 2015-04-06 MED ORDER — SITAGLIP PHOS-METFORMIN HCL ER 100-1000 MG PO TB24: 1.0000 | ORAL_TABLET | Freq: Every day | ORAL | Status: DC

## 2015-07-02 ENCOUNTER — Encounter: Payer: Self-pay | Admitting: Sports Medicine

## 2015-07-02 ENCOUNTER — Ambulatory Visit (INDEPENDENT_AMBULATORY_CARE_PROVIDER_SITE_OTHER): Payer: Managed Care, Other (non HMO) | Admitting: Sports Medicine

## 2015-07-02 DIAGNOSIS — G4700 Insomnia, unspecified: Secondary | ICD-10-CM | POA: Diagnosis not present

## 2015-07-02 MED ORDER — METFORMIN HCL 1000 MG PO TABS: 1000.0000 mg | ORAL_TABLET | Freq: Every day | ORAL | Status: DC

## 2015-07-02 MED ORDER — HYDROXYZINE HCL 25 MG PO TABS: 25.0000 mg | ORAL_TABLET | Freq: Every day | ORAL | Status: DC

## 2015-07-02 NOTE — Progress Notes (Signed)
  Subjective:    CC: Follow-up  HPI: Diabetes mellitus type 2: On Janumet XR, hemoglobin A1c has been consistently improving, currently is 5.9. Agreeable to decrease his diabetes medications.  Insomnia: We have worked on sleep hygiene, unfortunately continues to be unable to sleep fully through the night, also endorses significant rhinorrhea through the night.  Past medical history, Surgical history, Family history not pertinant except as noted below, Social history, Allergies, and medications have been entered into the medical record, reviewed, and no changes needed.   Review of Systems: No fevers, chills, night sweats, weight loss, chest pain, or shortness of breath.   Objective:    General: Well Developed, well nourished, and in no acute distress.  Neuro: Alert and oriented x3, extra-ocular muscles intact, sensation grossly intact.  HEENT: Normocephalic, atraumatic, pupils equal round reactive to light, neck supple, no masses, no lymphadenopathy, thyroid nonpalpable.  Skin: Warm and dry, no rashes. Cardiac: Regular rate and rhythm, no murmurs rubs or gallops, no lower extremity edema.  Respiratory: Clear to auscultation bilaterally. Not using accessory muscles, speaking in full sentences.  Impression and Recommendations:    I spent 25 minutes with this patient, greater than 50% was face-to-face time counseling regarding the above diagnoses

## 2015-07-02 NOTE — Assessment & Plan Note (Signed)
Hemoglobin A1c is down to 5.9.  Discontinue Janumet XR, and switching to metformin XR, recheck A1c in 3 months.

## 2015-07-02 NOTE — Assessment & Plan Note (Signed)
At this point he has tried sleep hygiene, melatonin, no improvement, does have some rhinorrhea at night, adding hydroxyzine daily at bedtime.

## 2015-07-05 MED ORDER — AMBULATORY NON FORMULARY MEDICATION: Status: DC

## 2015-07-05 MED ORDER — TADALAFIL 5 MG PO TABS: 5.0000 mg | ORAL_TABLET | Freq: Every day | ORAL | Status: DC

## 2015-07-05 NOTE — Telephone Encounter (Signed)
Please see Mychart messages, rx for glucose monitor needs to be faxed.

## 2015-07-19 ENCOUNTER — Other Ambulatory Visit: Payer: Self-pay | Admitting: Sports Medicine

## 2015-07-19 MED ORDER — AMBULATORY NON FORMULARY MEDICATION: Status: DC

## 2015-10-01 ENCOUNTER — Encounter: Payer: Self-pay | Admitting: Family Medicine

## 2015-10-01 ENCOUNTER — Ambulatory Visit (INDEPENDENT_AMBULATORY_CARE_PROVIDER_SITE_OTHER): Payer: Managed Care, Other (non HMO) | Admitting: Family Medicine

## 2015-10-01 VITALS — BP 134/87 | HR 77 | Wt 182.0 lb

## 2015-10-01 DIAGNOSIS — N529 Male erectile dysfunction, unspecified: Secondary | ICD-10-CM | POA: Diagnosis not present

## 2015-10-01 DIAGNOSIS — E119 Type 2 diabetes mellitus without complications: Secondary | ICD-10-CM

## 2015-10-01 DIAGNOSIS — L309 Dermatitis, unspecified: Secondary | ICD-10-CM | POA: Diagnosis not present

## 2015-10-01 LAB — POCT GLYCOSYLATED HEMOGLOBIN (HGB A1C): HEMOGLOBIN A1C: 5.9

## 2015-10-01 MED ORDER — SILDENAFIL CITRATE 20 MG PO TABS: ORAL_TABLET | ORAL | Status: DC

## 2015-10-01 MED ORDER — TRIAMCINOLONE ACETONIDE 0.1 % EX CREA: TOPICAL_CREAM | CUTANEOUS | Status: DC

## 2015-10-01 MED ORDER — METFORMIN HCL 1000 MG PO TABS: 1000.0000 mg | ORAL_TABLET | Freq: Every day | ORAL | Status: DC

## 2015-10-01 NOTE — Addendum Note (Signed)
Addended by: Delrae Alfred on: 10/01/2015 09:13 AM   Modules accepted: Orders

## 2015-10-01 NOTE — Progress Notes (Signed)
CC: James Frye is a 65 y.o. male is here for Hyperglycemia and Medication Questions   Subjective: HPI:  FU DM2: Since stopping Janumet and starting Metformin alone he is not experiencing any side effects.  No outside blood sugars to report.  No vision loss, polyuria, nor polydipsia.    He wants to know if there is anything cheaper than Cialis for ED.  He has difficulty both initiating and maintining an erection.  Denies any other genitourinary complaints.    Complains of itching on the back localized just below the neck. Seems to be worsened dry conditions. No benefit from hydrocortisone cream. No other interventions as of yet. He denies any skin complaints elsewhere. No fevers, chills or swollen lymph nodes.   Review Of Systems Outlined In HPI  Past Medical History  Diagnosis Date  . Diabetes mellitus   . CAD (coronary artery disease)   . Hiatal hernia     Past Surgical History  Procedure Laterality Date  . Gastric bypass    . Appendectomy     Family History  Problem Relation Age of Onset  . Cancer Mother   . Stroke Father   . Heart disease Father     Social History   Social History  . Marital Status: Married    Spouse Name: N/A  . Number of Children: 3  . Years of Education: N/A   Occupational History  . Not on file.   Social History Main Topics  . Smoking status: Former Research scientist (life sciences)  . Smokeless tobacco: Never Used  . Alcohol Use: No  . Drug Use: No  . Sexual Activity: Yes   Other Topics Concern  . Not on file   Social History Narrative     Objective: BP 134/87 mmHg  Pulse 77  Wt 182 lb (82.555 kg)  General: Alert and Oriented, No Acute Distress HEENT: Pupils equal, round, reactive to light. Conjunctivae clear. Moist membranes Lungs: Clear to auscultation bilaterally, no wheezing/ronchi/rales.  Comfortable work of breathing. Good air movement. Cardiac: Regular rate and rhythm. Normal S1/S2.  No murmurs, rubs, nor gallops.   Extremities: No  peripheral edema.  Strong peripheral pulses.  Mental Status: No depression, anxiety, nor agitation. Skin: Warm and dry. Dry skin on the base of the posterior neck, no other abnormalities  Assessment & Plan: James Frye was seen today for hyperglycemia and medication questions.  Diagnoses and all orders for this visit:  Controlled type 2 diabetes mellitus without complication, without long-term current use of insulin (HCC)  Dermatitis -     triamcinolone cream (KENALOG) 0.1 %; Apply to affected areas twice a day for up to two weeks, avoid face.  Other orders -     metFORMIN (GLUCOPHAGE) 1000 MG tablet; Take 1 tablet (1,000 mg total) by mouth daily with breakfast. -     sildenafil (REVATIO) 20 MG tablet; 2-4 tabs by mouth only as needed for sex.   Type 2 diabetes: A1c of 5.9, control with metformin, continue current regimen. Dermatitis of the posterior neck, start triamcinolone cream on an as-needed basis. Erectile dysfunction: Trial of generic Viagra.  Return in about 3 months (around 12/29/2015).

## 2015-10-04 ENCOUNTER — Encounter: Payer: Self-pay | Admitting: Family Medicine

## 2015-10-05 MED ORDER — CLOBETASOL PROPIONATE 0.05 % EX OINT: 1.0000 "application " | TOPICAL_OINTMENT | Freq: Two times a day (BID) | CUTANEOUS | Status: DC

## 2015-11-05 LAB — HM DIABETES EYE EXAM

## 2015-11-08 ENCOUNTER — Encounter: Payer: Self-pay | Admitting: Sports Medicine

## 2015-11-19 ENCOUNTER — Encounter: Payer: Self-pay | Admitting: *Deleted

## 2015-11-19 ENCOUNTER — Emergency Department (INDEPENDENT_AMBULATORY_CARE_PROVIDER_SITE_OTHER)
Admission: EM | Admit: 2015-11-19 | Discharge: 2015-11-19 | Disposition: A | Discharge status: Home / Self Care | Payer: Managed Care, Other (non HMO) | Source: Home / Self Care | Attending: Family Medicine | Admitting: Family Medicine

## 2015-11-19 DIAGNOSIS — H01006 Unspecified blepharitis left eye, unspecified eyelid: Secondary | ICD-10-CM | POA: Diagnosis not present

## 2015-11-19 DIAGNOSIS — H00014 Hordeolum externum left upper eyelid: Secondary | ICD-10-CM

## 2015-11-19 MED ORDER — POLYMYXIN B-TRIMETHOPRIM 10000-0.1 UNIT/ML-% OP SOLN: 1.0000 [drp] | Freq: Four times a day (QID) | OPHTHALMIC | Status: DC

## 2015-11-19 NOTE — Discharge Instructions (Signed)
Blepharitis Blepharitis means swollen eyelids. HOME CARE Pay attention to any changes in how you look or feel. Follow these instructions to help with your condition: Keeping Clean  Wash your hands often.  Wash your eyelids with warm water, or wash them with warm water that is mixed with little bit of baby shampoo. Do this 2 or more times per day.  Wash your face and eyebrows at least once a day.  Use a clean towel each time you dry your eyelids. Do not use the towel to clean or dry other areas of your body. Do not share your towel with anyone. General Instructions  Avoid wearing makeup until you get better. Do not share makeup with anyone.  Avoid rubbing your eyes.  Put a warm compress on your eyes 2 times per day for 10 minutes at a time or as told by your doctor.  If you were told to use an medicated cream or eye drops, use the medicine as told by your doctor. Do not stop using the medicine even if you feel better.  Keep all follow-up visits as told by your doctor. This is important. GET HELP IF:  Your eyelids feel hot.  You have blisters on your eyelids.  You have a rash on your eyelids.  The swelling does not go away in 2-4 days.  The swelling gets worse. GET HELP RIGHT AWAY IF:  You have pain that gets worse.  You have pain that spreads to other parts of your face.  You have redness that gets worse.  You have redness that spreads to other parts of your face.  Your vision changes.  You have pain when you look at lights or things that move.  You have a fever.   This information is not intended to replace advice given to you by your health care provider. Make sure you discuss any questions you have with your health care provider.   Document Released: 05/09/2008 Document Revised: 04/21/2015 Document Reviewed: 11/23/2014 Elsevier Interactive Patient Education 2016 [place] is a bump on your eyelid caused by a bacterial infection. A stye can  form inside the eyelid (internal stye) or outside the eyelid (external stye). An internal stye may be caused by an infected oil-producing gland inside your eyelid. An external stye may be caused by an infection at the base of your eyelash (hair follicle). Styes are very common. Anyone can get them at any age. They usually occur in just one eye, but you may have more than one in either eye.  CAUSES  The infection is almost always caused by bacteria called Staphylococcus aureus. This is a common type of bacteria that lives on your skin. RISK FACTORS You may be at higher risk for a stye if you have had one before. You may also be at higher risk if you have:  Diabetes.  Long-term illness.  Long-term eye redness.  A skin condition called seborrhea.  High fat levels in your blood (lipids). SIGNS AND SYMPTOMS  Eyelid pain is the most common symptom of a stye. Internal styes are more painful than external styes. Other signs and symptoms may include:  Painful swelling of your eyelid.  A scratchy feeling in your eye.  Tearing and redness of your eye.  Pus draining from the stye. DIAGNOSIS  Your health care provider may be able to diagnose a stye just by examining your eye. The health care provider may also check to make sure:  You do not  have a fever or other signs of a more serious infection.  The infection has not spread to other parts of your eye or areas around your eye. TREATMENT  Most styes will clear up in a few days without treatment. In some cases, you may need to use antibiotic drops or ointment to prevent infection. Your health care provider may have to drain the stye surgically if your stye is:  Large.  Causing a lot of pain.  Interfering with your vision. This can be done using a thin blade or a needle.  HOME CARE INSTRUCTIONS   Take medicines only as directed by your health care provider.  Apply a clean, warm compress to your eye for 10 minutes, 4 times a day.  Do  not wear contact lenses or eye makeup until your stye has healed.  Do not try to pop or drain the stye. SEEK MEDICAL CARE IF:  You have chills or a fever.  Your stye does not go away after several days.  Your stye affects your vision.  Your eyeball becomes swollen, red, or painful. MAKE SURE YOU:  Understand these instructions.  Will watch your condition.  Will get help right away if you are not doing well or get worse.   This information is not intended to replace advice given to you by your health care provider. Make sure you discuss any questions you have with your health care provider.   Document Released: 05/10/2005 Document Revised: 08/21/2014 Document Reviewed: 11/14/2013 Elsevier Interactive Patient Education [employer].

## 2015-11-19 NOTE — ED Provider Notes (Signed)
CSN: [id number]     Arrival date & time 11/19/15  1525 History   First MD Initiated Contact with Patient 11/19/15 1539     Chief Complaint  Patient presents with  . Eye Problem   (Consider location/radiation/quality/duration/timing/severity/associated sxs/prior Treatment) HPI The pt is a 65yo male presenting to [facility] with c/o Left eye pain and irritation with associated swelling and redness of upper eyelid. Symptoms started 1 week ago, gradually worsening. Pain is sore, mild in severity.  Mild clear-yellow drainage at times.  Denies fever, chills, n/v/d. Denies cough or congestion. He wears glasses but not contacts. Denies change in vision.  Past Medical History  Diagnosis Date  . Diabetes mellitus   . CAD (coronary artery disease)   . Hiatal hernia    Past Surgical History  Procedure Laterality Date  . Gastric bypass    . Appendectomy     Family History  Problem Relation Age of Onset  . Cancer Mother   . Stroke Father   . Heart disease Father    Social History  Substance Use Topics  . Smoking status: Former Research scientist (life sciences)  . Smokeless tobacco: Never Used  . Alcohol Use: No    Review of Systems  Constitutional: Negative for fever and chills.  HENT: Negative for congestion, ear pain, rhinorrhea, sinus pressure, sneezing and sore throat.   Eyes: Positive for pain, discharge, redness and itching. Negative for photophobia and visual disturbance.  Neurological: Negative for dizziness, light-headedness and headaches.    Allergies  Penicillins  Home Medications   Prior to Admission medications   Medication Sig Start Date End Date Taking? Authorizing Provider  AMBULATORY NON FORMULARY MEDICATION Single glucometer with lancets, test strips 07/19/15   Silverio Decamp, MD  clobetasol ointment (TEMOVATE) AB-123456789 % Apply 1 application topically 2 (two) times daily. 10/05/15   Marcial Pacas, DO  hydrOXYzine (ATARAX/VISTARIL) 25 MG tablet Take 1 tablet (25 mg total) by mouth at bedtime.  07/02/15   Silverio Decamp, MD  metFORMIN (GLUCOPHAGE) 1000 MG tablet Take 1 tablet (1,000 mg total) by mouth daily with breakfast. 10/01/15   Marcial Pacas, DO  sildenafil (REVATIO) 20 MG tablet 2-4 tabs by mouth only as needed for sex. 10/01/15   Sean Hommel, DO  tadalafil (CIALIS) 5 MG tablet Take 1 tablet (5 mg total) by mouth daily. Use daily. 07/05/15   Silverio Decamp, MD  triamcinolone cream (KENALOG) 0.1 % Apply to affected areas twice a day for up to two weeks, avoid face. 10/01/15 09/30/16  Marcial Pacas, DO  trimethoprim-polymyxin b (POLYTRIM) ophthalmic solution Place 1 drop into the left eye every 6 (six) hours. For 6 days 11/19/15   Noland Fordyce, PA-C   Meds Ordered and Administered this Visit  Medications - No data to display  BP 127/80 mmHg  Pulse 65  Temp(Src) 98.3 F (36.8 C) (Oral)  Wt 182 lb (82.555 kg)  SpO2 100% No data found.   Physical Exam  Constitutional: He is oriented to person, place, and time. He appears well-developed and well-nourished.  HENT:  Head: Normocephalic and atraumatic.  Right Ear: Tympanic membrane normal.  Left Ear: Tympanic membrane normal.  Nose: Nose normal.  Eyes: Conjunctivae and EOM are normal. Pupils are equal, round, and reactive to light. Right eye exhibits no discharge. Left eye exhibits hordeolum. Left eye exhibits no chemosis and no discharge. No foreign body present in the left eye. Left conjunctiva is not injected. Left conjunctiva has no hemorrhage. No scleral icterus.  Mild erythema with edema and tenderness to Left upper eyelid  Neck: Normal range of motion. Neck supple.  Cardiovascular: Normal rate.   Pulmonary/Chest: Effort normal. No respiratory distress. He has no wheezes.  Musculoskeletal: Normal range of motion.  Neurological: He is alert and oriented to person, place, and time.  Skin: Skin is warm and dry.  Psychiatric: He has a normal mood and affect. His behavior is normal.  Nursing note and vitals  reviewed.   ED Course  Procedures (including critical care time)  Labs Review Labs Reviewed - No data to display  Imaging Review No results found.   Visual Acuity Review  Right Eye Distance: 20/20 Left Eye Distance: 20/25 Bilateral Distance: 20/15   MDM   1. Hordeolum externum of left upper eyelid   2. Blepharitis of eyelid of left eye     Pt c/o Left upper eyelid pain, redness, and swelling. Exam c/w hordeolum and blepharitis.  Rx: polytrim ophthalmic drops. Encouraged warm compresses several times a day. F/u with eye doctor or PCP in 4-5 days if not improving, sooner if worsening. Patient verbalized understanding and agreement with treatment plan.      Noland Fordyce, PA-C 11/19/15 1615

## 2015-11-19 NOTE — ED Notes (Signed)
Pt c/o 1 week of left eye swelling, soreness and draining.

## 2015-11-23 ENCOUNTER — Ambulatory Visit (INDEPENDENT_AMBULATORY_CARE_PROVIDER_SITE_OTHER): Payer: Managed Care, Other (non HMO) | Admitting: Family Medicine

## 2015-11-23 ENCOUNTER — Encounter: Payer: Self-pay | Admitting: Family Medicine

## 2015-11-23 VITALS — BP 137/82 | HR 84 | Wt 182.0 lb

## 2015-11-23 DIAGNOSIS — H00019 Hordeolum externum unspecified eye, unspecified eyelid: Secondary | ICD-10-CM | POA: Insufficient documentation

## 2015-11-23 DIAGNOSIS — R059 Cough, unspecified: Secondary | ICD-10-CM

## 2015-11-23 DIAGNOSIS — R05 Cough: Secondary | ICD-10-CM

## 2015-11-23 DIAGNOSIS — H00014 Hordeolum externum left upper eyelid: Secondary | ICD-10-CM

## 2015-11-23 DIAGNOSIS — R053 Chronic cough: Secondary | ICD-10-CM | POA: Insufficient documentation

## 2015-11-23 MED ORDER — GUAIFENESIN-CODEINE 100-10 MG/5ML PO SOLN: 5.0000 mL | Freq: Every evening | ORAL | Status: DC | PRN

## 2015-11-23 NOTE — Assessment & Plan Note (Signed)
Hordeolum. Continue warm compress and Polytrim. If not better follow-up with ophthalmology. Patient was given the phone number for [facility] eye surgeons.

## 2015-11-23 NOTE — Progress Notes (Signed)
       James Frye is a 65 y.o. male who presents to [facility] today for left eye irritation. Patient was seen in urgent care April 7 where he was diagnosed with a left upper eyelid hordeolum. He was treated with Polytrim eyedrops and warm compress. He has failed to improve. He denies any pain or difficulty with vision. No fevers chills nausea vomiting or diarrhea.  Additionally he notes some mild bothersome nighttime cough. She denies any wheezing or shortness of breath.   Past Medical History  Diagnosis Date  . Diabetes mellitus   . CAD (coronary artery disease)   . Hiatal hernia    Past Surgical History  Procedure Laterality Date  . Gastric bypass    . Appendectomy     Social History  Substance Use Topics  . Smoking status: Former Research scientist (life sciences)  . Smokeless tobacco: Never Used  . Alcohol Use: No   family history includes Cancer in his mother; Heart disease in his father; Stroke in his father.  ROS as above Medications: Current Outpatient Prescriptions  Medication Sig Dispense Refill  . AMBULATORY NON FORMULARY MEDICATION Single glucometer with lancets, test strips 1 each 0  . clobetasol ointment (TEMOVATE) AB-123456789 % Apply 1 application topically 2 (two) times daily. 30 g 0  . hydrOXYzine (ATARAX/VISTARIL) 25 MG tablet Take 1 tablet (25 mg total) by mouth at bedtime. 30 tablet 11  . metFORMIN (GLUCOPHAGE) 1000 MG tablet Take 1 tablet (1,000 mg total) by mouth daily with breakfast. 90 tablet 1  . sildenafil (REVATIO) 20 MG tablet 2-4 tabs by mouth only as needed for sex. 50 tablet 0  . tadalafil (CIALIS) 5 MG tablet Take 1 tablet (5 mg total) by mouth daily. Use daily. 30 tablet 11  . triamcinolone cream (KENALOG) 0.1 % Apply to affected areas twice a day for up to two weeks, avoid face. 80 g 0  . trimethoprim-polymyxin b (POLYTRIM) ophthalmic solution Place 1 drop into the left eye  every 6 (six) hours. For 6 days 10 mL 0  . guaiFENesin-codeine 100-10 MG/5ML syrup Take 5 mLs by mouth at bedtime as needed for cough. 120 mL 0   No current facility-administered medications for this visit.   Allergies  Allergen Reactions  . Penicillins Rash     Exam:  BP 137/82 mmHg  Pulse 84  Wt 182 lb (82.555 kg) Gen: Well NAD HEENT: EOMI,  MMM Mildly tender hordeolum present in the upper left eyelid medially. Normal eye motion. PERRL Visual acuity:  Right eye 20/20 Left eye 20/20 Bilaterally 20/15 Lungs: Normal work of breathing. CTABL Heart: RRR no MRG Abd: NABS, Soft. Nondistended, Nontender Exts: Brisk capillary refill, warm and well perfused.   No results found for this or any previous visit (from the past 24 hour(s)). No results found.   Please see individual assessment and plan sections.

## 2015-11-23 NOTE — Assessment & Plan Note (Signed)
Likely viral. Use codeine cough syrup at bedtime as needed.

## 2015-11-23 NOTE — Patient Instructions (Signed)
Thank you for coming in today. Use warm compress frequently .  Continue the eye drops.  Add the  over-the-counter Zaditor eyedrops (Ketotifen) twice daily as needed for itching.   Call [facility] associates.  [address], [place], [place], [place] [address] Phone: [phone]   Use the cough medicine as needed.    Stye A stye is a bump on your eyelid caused by a bacterial infection. A stye can form inside the eyelid (internal stye) or outside the eyelid (external stye). An internal stye may be caused by an infected oil-producing gland inside your eyelid. An external stye may be caused by an infection at the base of your eyelash (hair follicle). Styes are very common. Anyone can get them at any age. They usually occur in just one eye, but you may have more than one in either eye.  CAUSES  The infection is almost always caused by bacteria called Staphylococcus aureus. This is a common type of bacteria that lives on your skin. RISK FACTORS You may be at higher risk for a stye if you have had one before. You may also be at higher risk if you have:  Diabetes.  Long-term illness.  Long-term eye redness.  A skin condition called seborrhea.  High fat levels in your blood (lipids). SIGNS AND SYMPTOMS  Eyelid pain is the most common symptom of a stye. Internal styes are more painful than external styes. Other signs and symptoms may include:  Painful swelling of your eyelid.  A scratchy feeling in your eye.  Tearing and redness of your eye.  Pus draining from the stye. DIAGNOSIS  Your health care provider may be able to diagnose a stye just by examining your eye. The health care provider may also check to make sure:  You do not have a fever or other signs of a more serious infection.  The infection has not spread to other parts of your eye or areas around your eye. TREATMENT  Most styes will clear up in a few days without treatment. In some cases, you may need to use  antibiotic drops or ointment to prevent infection. Your health care provider may have to drain the stye surgically if your stye is:  Large.  Causing a lot of pain.  Interfering with your vision. This can be done using a thin blade or a needle.  HOME CARE INSTRUCTIONS   Take medicines only as directed by your health care provider.  Apply a clean, warm compress to your eye for 10 minutes, 4 times a day.  Do not wear contact lenses or eye makeup until your stye has healed.  Do not try to pop or drain the stye. SEEK MEDICAL CARE IF:  You have chills or a fever.  Your stye does not go away after several days.  Your stye affects your vision.  Your eyeball becomes swollen, red, or painful. MAKE SURE YOU:  Understand these instructions.  Will watch your condition.  Will get help right away if you are not doing well or get worse.   This information is not intended to replace advice given to you by your health care provider. Make sure you discuss any questions you have with your health care provider.   Document Released: 05/10/2005 Document Revised: 08/21/2014 Document Reviewed: 11/14/2013 Elsevier Interactive Patient Education [employer].

## 2015-12-29 ENCOUNTER — Encounter: Payer: Self-pay | Admitting: Sports Medicine

## 2015-12-29 ENCOUNTER — Ambulatory Visit (INDEPENDENT_AMBULATORY_CARE_PROVIDER_SITE_OTHER): Payer: Managed Care, Other (non HMO) | Admitting: Sports Medicine

## 2015-12-29 VITALS — BP 122/80 | HR 69 | Resp 16 | Wt 180.0 lb

## 2015-12-29 DIAGNOSIS — N400 Enlarged prostate without lower urinary tract symptoms: Secondary | ICD-10-CM

## 2015-12-29 DIAGNOSIS — E119 Type 2 diabetes mellitus without complications: Secondary | ICD-10-CM

## 2015-12-29 LAB — POCT GLYCOSYLATED HEMOGLOBIN (HGB A1C): Hemoglobin A1C: 6

## 2015-12-29 MED ORDER — TADALAFIL 5 MG PO TABS: 5.0000 mg | ORAL_TABLET | Freq: Every day | ORAL | Status: DC

## 2015-12-29 NOTE — Progress Notes (Signed)
  Subjective:    CC: Follow-up  HPI: Diabetes mellitus type 2: Discontinue Januvia at the last visit, he is only on metformin now, hemoglobin A1c went from 5.7 - 6.0.  BPH: Has tried Flomax in the distant past, multiple episodes of nocturia, hesitancy, frequency, weak stream and dribbling.  Past medical history, Surgical history, Family history not pertinant except as noted below, Social history, Allergies, and medications have been entered into the medical record, reviewed, and no changes needed.   Review of Systems: No fevers, chills, night sweats, weight loss, chest pain, or shortness of breath.   Objective:    General: Well Developed, well nourished, and in no acute distress.  Neuro: Alert and oriented x3, extra-ocular muscles intact, sensation grossly intact.  HEENT: Normocephalic, atraumatic, pupils equal round reactive to light, neck supple, no masses, no lymphadenopathy, thyroid nonpalpable.  Skin: Warm and dry, no rashes. Cardiac: Regular rate and rhythm, no murmurs rubs or gallops, no lower extremity edema.  Respiratory: Clear to auscultation bilaterally. Not using accessory muscles, speaking in full sentences.  Impression and Recommendations:    I spent 25 minutes with this patient, greater than 50% was face-to-face time counseling regarding the above diagnoses

## 2015-12-29 NOTE — Addendum Note (Signed)
Addended by: Silverio Decamp on: 12/29/2015 11:50 AM   Modules accepted: Orders

## 2015-12-29 NOTE — Assessment & Plan Note (Signed)
History and failed Flomax. Adding Cialis for daily use. High AUA score.

## 2015-12-29 NOTE — Assessment & Plan Note (Signed)
Hemoglobin A1c has increased to 6 as expected with stopping Januvia. Images are stable now, continue metformin for now.

## 2015-12-29 NOTE — Addendum Note (Signed)
Addended by: Silverio Decamp on: 12/29/2015 10:10 AM   Modules accepted: Orders

## 2015-12-31 ENCOUNTER — Encounter: Payer: Self-pay | Admitting: Sports Medicine

## 2015-12-31 ENCOUNTER — Other Ambulatory Visit: Payer: Self-pay | Admitting: Sports Medicine

## 2015-12-31 ENCOUNTER — Other Ambulatory Visit: Payer: Self-pay | Admitting: Family Medicine

## 2015-12-31 DIAGNOSIS — N521 Erectile dysfunction due to diseases classified elsewhere: Principal | ICD-10-CM

## 2015-12-31 DIAGNOSIS — E1169 Type 2 diabetes mellitus with other specified complication: Secondary | ICD-10-CM

## 2016-02-23 ENCOUNTER — Ambulatory Visit (INDEPENDENT_AMBULATORY_CARE_PROVIDER_SITE_OTHER): Payer: Managed Care, Other (non HMO) | Admitting: Sports Medicine

## 2016-02-23 ENCOUNTER — Encounter: Payer: Self-pay | Admitting: Sports Medicine

## 2016-02-23 VITALS — BP 117/75 | HR 81 | Resp 18 | Wt 180.9 lb

## 2016-02-23 DIAGNOSIS — G43809 Other migraine, not intractable, without status migrainosus: Secondary | ICD-10-CM

## 2016-02-23 DIAGNOSIS — G43109 Migraine with aura, not intractable, without status migrainosus: Secondary | ICD-10-CM | POA: Insufficient documentation

## 2016-02-23 MED ORDER — RIZATRIPTAN BENZOATE 10 MG PO TBDP: 10.0000 mg | ORAL_TABLET | ORAL | Status: DC | PRN

## 2016-02-23 NOTE — Progress Notes (Signed)
  Subjective:    CC: Visual loss  HPI: This is a pleasant 65 year old male, he has a history of a CVA, I don't have any details, or MRI reports, however back in 2010 he did have a CT angiogram of the head and neck concerning for posterior communicating artery aneurysm followed by a catheter directed angiogram that was negative for evidence of aneurysms. He tells me that his symptoms at that time or slurred speech, visual loss, drooling. This resolved, and subsequently was diagnosed with a TIA. More recently he was driving, saw flashes and sparkles, then lost vision in his right eye, left eye was okay, this lasted for a proximally 45 minutes and was not associated with any facial droop or slurred speech, this was then followed by a dull throbbing headache. All symptoms have since resolved.  Past medical history, Surgical history, Family history not pertinant except as noted below, Social history, Allergies, and medications have been entered into the medical record, reviewed, and no changes needed.   Review of Systems: No fevers, chills, night sweats, weight loss, chest pain, or shortness of breath.   Objective:    General: Well Developed, well nourished, and in no acute distress.  Neuro: Alert and oriented x3, extra-ocular muscles intact, sensation grossly intact. Cranial nerves II through XII are intact, motor, sensory, coordinative functions are all intact. HEENT: Normocephalic, atraumatic, pupils equal round reactive to light, neck supple, no masses, no lymphadenopathy, thyroid nonpalpable.  Skin: Warm and dry, no rashes. Cardiac: Regular rate and rhythm, no murmurs rubs or gallops, no lower extremity edema.  Respiratory: Clear to auscultation bilaterally. Not using accessory muscles, speaking in full sentences.  Impression and Recommendations:    I spent 25 minutes with this patient, greater than 50% was face-to-face time counseling regarding the above diagnoses

## 2016-02-23 NOTE — Assessment & Plan Note (Signed)
Fairly classic symptoms, adding Maxalt. He does have history of CVA, considering symptoms we are going to repeat the stroke workup. MRI/MRI of the brain, echocardiogram, carotid Dopplers.

## 2016-02-24 ENCOUNTER — Ambulatory Visit (HOSPITAL_BASED_OUTPATIENT_CLINIC_OR_DEPARTMENT_OTHER)
Admission: RE | Admit: 2016-02-24 | Discharge: 2016-02-24 | Disposition: A | Discharge status: Home / Self Care | Payer: Managed Care, Other (non HMO) | Source: Ambulatory Visit | Attending: Sports Medicine | Admitting: Sports Medicine

## 2016-02-24 DIAGNOSIS — G43809 Other migraine, not intractable, without status migrainosus: Secondary | ICD-10-CM | POA: Insufficient documentation

## 2016-02-24 DIAGNOSIS — I6523 Occlusion and stenosis of bilateral carotid arteries: Secondary | ICD-10-CM | POA: Diagnosis not present

## 2016-03-01 ENCOUNTER — Encounter: Payer: Self-pay | Admitting: Sports Medicine

## 2016-03-01 ENCOUNTER — Ambulatory Visit (HOSPITAL_BASED_OUTPATIENT_CLINIC_OR_DEPARTMENT_OTHER): Payer: Managed Care, Other (non HMO)

## 2016-03-19 ENCOUNTER — Encounter: Payer: Self-pay | Admitting: Sports Medicine

## 2016-03-27 ENCOUNTER — Ambulatory Visit (INDEPENDENT_AMBULATORY_CARE_PROVIDER_SITE_OTHER): Payer: Managed Care, Other (non HMO) | Admitting: Sports Medicine

## 2016-03-27 ENCOUNTER — Ambulatory Visit (INDEPENDENT_AMBULATORY_CARE_PROVIDER_SITE_OTHER): Payer: Managed Care, Other (non HMO)

## 2016-03-27 ENCOUNTER — Encounter: Payer: Self-pay | Admitting: Sports Medicine

## 2016-03-27 DIAGNOSIS — R079 Chest pain, unspecified: Secondary | ICD-10-CM | POA: Insufficient documentation

## 2016-03-27 DIAGNOSIS — R0602 Shortness of breath: Secondary | ICD-10-CM

## 2016-03-27 LAB — CBC WITH DIFFERENTIAL/PLATELET
Basophils Absolute: 0 {cells}/uL (ref 0–200)
Basophils Relative: 0 %
Eosinophils Absolute: 120 {cells}/uL (ref 15–500)
Eosinophils Relative: 2 %
HCT: 36.9 % — ABNORMAL LOW (ref 38.5–50.0)
Hemoglobin: 12.6 g/dL — ABNORMAL LOW (ref 13.2–17.1)
Lymphs Abs: 1200 {cells}/uL (ref 850–3900)
MCH: 29.4 pg (ref 27.0–33.0)
MCHC: 34.1 g/dL (ref 32.0–36.0)
MCV: 86.2 fL (ref 80.0–100.0)
MPV: 9.3 fL (ref 7.5–12.5)
Monocytes Absolute: 660 {cells}/uL (ref 200–950)
Monocytes Relative: 11 %
Neutro Abs: 4020 cells/uL (ref 1500–7800)
Neutrophils Relative %: 67 %
Platelets: 232 10*3/uL (ref 140–400)
RBC: 4.28 MIL/uL (ref 4.20–5.80)
RDW: 13.7 % (ref 11.0–15.0)
WBC: 6 K/uL (ref 3.8–10.8)

## 2016-03-27 LAB — CK TOTAL AND CKMB (NOT AT ARMC)
CK, MB: <0.7 ng/mL (ref 0.0–5.0)
Total CK: 105 U/L (ref 7–232)

## 2016-03-27 LAB — TROPONIN I: Troponin I: <0.01 ng/mL (ref <=0.05)

## 2016-03-27 LAB — D-DIMER, QUANTITATIVE: D-Dimer, Quant: 0.4 mcg/mL FEU (ref <0.50)

## 2016-03-27 NOTE — Assessment & Plan Note (Signed)
Unclear etiology but does not sound anginal, we are also unable to reproduce it on exam. D-dimer, CBC, cardiac enzymes. Chest x-ray. Out of work for 2 days.

## 2016-03-27 NOTE — Progress Notes (Signed)
  Subjective:    CC: trouble breathing  HPI: 65 yo M with history of CAD presenting with one day of chest pain and shortness of breath.  He says yesterday morning he developed pleuritic chest pain that caused him to have trouble breathing.  He says when he tries to take a breathe in he develops chest pain and cannot take full breaths.  Pain has been constant since yesterday, and is worse when he moves in certain directions.  He had some difficulty sleeping last night due to having to take shallow breaths.  This morning, chest pain and shortness of breath is slightly improved. He has not tried anything for pain.  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  No recent travel or long car rides.  No fevers, night sweats, chills.  No wheezing, cough, or sore throat. Denies chest pressure or pain radiating up his neck or down his arm.  No recent illnesses.   Past medical history, Surgical history, Family history not pertinant except as noted below, Social history, Allergies, and medications have been entered into the medical record, reviewed, and no changes needed.   Review of Systems: No fevers, chills, night sweats, weight loss. +chest discomfort, +shortness of breath    Objective:    General: Well Developed, well nourished, and in no acute distress.  Neuro: Alert and oriented x3, extra-ocular muscles intact, sensation grossly intact.  HEENT: Normocephalic, atraumatic, pupils equal round reactive to light, neck supple, no masses, no lymphadenopathy, thyroid nonpalpable.  Skin: Warm and dry, no rashes. Cardiac: Regular rate and rhythm, no murmurs rubs or gallops, no lower extremity edema.  Respiratory: Decreased work of breathing on L.  Clear to auscultation bilaterally. No wheezing or rhonchi.  Not using accessory muscles, speaking in full sentences. MSK: No TTP over chest wall  Impression and Recommendations:    No problem-specific  Assessment & Plan notes found for this encounter.  1. Pleuritic chest pain and associated SOB: Likely costochondritis but cannot exclude PE given pleuritic nature of pain.  Also consider pneumothorax given  decreased work of breathing on left. Given history of CAD, consider ACS or MI.  -D dimer.  If positive, will proceed with CT angio.  -CXR to r/o pnuemothorax, rib fracture  -Troponin to r/o MI

## 2016-03-28 ENCOUNTER — Encounter: Payer: Self-pay | Admitting: Sports Medicine

## 2016-03-29 ENCOUNTER — Telehealth: Payer: Self-pay

## 2016-03-29 MED ORDER — PREDNISONE 50 MG PO TABS: 50.0000 mg | ORAL_TABLET | Freq: Every day | ORAL | 0 refills | Status: DC

## 2016-03-29 MED ORDER — BENZONATATE 200 MG PO CAPS: 200.0000 mg | ORAL_CAPSULE | Freq: Three times a day (TID) | ORAL | 0 refills | Status: DC | PRN

## 2016-03-29 MED ORDER — AZITHROMYCIN 250 MG PO TABS: ORAL_TABLET | ORAL | 0 refills | Status: DC

## 2016-03-29 NOTE — Telephone Encounter (Signed)
Wife of patient called stating he's still experiencing chest pain and would like to get a note to excuse him from work for the rest of the week. Please assist.

## 2016-04-22 ENCOUNTER — Encounter: Payer: Self-pay | Admitting: Sports Medicine

## 2016-05-17 ENCOUNTER — Encounter (HOSPITAL_BASED_OUTPATIENT_CLINIC_OR_DEPARTMENT_OTHER): Payer: Self-pay | Admitting: *Deleted

## 2016-05-17 ENCOUNTER — Emergency Department (HOSPITAL_BASED_OUTPATIENT_CLINIC_OR_DEPARTMENT_OTHER)
Admission: EM | Admit: 2016-05-17 | Discharge: 2016-05-18 | Disposition: A | Discharge status: Home / Self Care | Payer: Worker's Compensation | Attending: Emergency Medicine | Admitting: Emergency Medicine

## 2016-05-17 ENCOUNTER — Emergency Department (HOSPITAL_BASED_OUTPATIENT_CLINIC_OR_DEPARTMENT_OTHER): Payer: Worker's Compensation

## 2016-05-17 DIAGNOSIS — E119 Type 2 diabetes mellitus without complications: Secondary | ICD-10-CM | POA: Insufficient documentation

## 2016-05-17 DIAGNOSIS — M25511 Pain in right shoulder: Secondary | ICD-10-CM | POA: Diagnosis not present

## 2016-05-17 DIAGNOSIS — Y93F2 Activity, caregiving, lifting: Secondary | ICD-10-CM | POA: Insufficient documentation

## 2016-05-17 DIAGNOSIS — Y929 Unspecified place or not applicable: Secondary | ICD-10-CM | POA: Diagnosis not present

## 2016-05-17 DIAGNOSIS — S4991XA Unspecified injury of right shoulder and upper arm, initial encounter: Secondary | ICD-10-CM | POA: Diagnosis present

## 2016-05-17 DIAGNOSIS — Y990 Civilian activity done for income or pay: Secondary | ICD-10-CM | POA: Insufficient documentation

## 2016-05-17 DIAGNOSIS — X500XXA Overexertion from strenuous movement or load, initial encounter: Secondary | ICD-10-CM | POA: Insufficient documentation

## 2016-05-17 DIAGNOSIS — I2510 Atherosclerotic heart disease of native coronary artery without angina pectoris: Secondary | ICD-10-CM | POA: Insufficient documentation

## 2016-05-17 DIAGNOSIS — Z87891 Personal history of nicotine dependence: Secondary | ICD-10-CM | POA: Insufficient documentation

## 2016-05-17 NOTE — ED Provider Notes (Signed)
[facility] Provider Note   CSN: [id number] Arrival date & time: 05/17/16  2116  By signing my name below, I, Soijett Blue, attest that this documentation has been prepared under the direction and in the presence of Hyman Bible, PA-C Electronically Signed: Soijett Blue, ED Scribe. 05/17/16. 11:55 PM.   History   Chief Complaint Chief Complaint  Patient presents with  . Shoulder Injury    HPI James Frye is a 65 y.o. male with a PMHx of DM, who presents to the Emergency Department complaining of right shoulder injury occurring 5 days ago. Pt notes that he was at work lifting heavy rolls of cloth prior to the onset of his right shoulder pain. Pt reports that his right shoulder pain is worsened with movement. Pt denies any alleviating factors for his right shoulder pain. Pt is having associated symptoms of tingling to right upper arm. He notes that he has tried OTC tylenol and icy hot patches with no relief of his symptoms. He denies color change, wound, rash, numbness, CP, and any other symptoms.    The history is provided by the patient. No language interpreter was used.    Past Medical History:  Diagnosis Date  . CAD (coronary artery disease)   . Diabetes mellitus   . Hiatal hernia     Patient Active Problem List   Diagnosis Date Noted  . Chest pain 03/27/2016  . Ocular migraine 02/23/2016  . Benign prostate hyperplasia 12/29/2015  . Hordeolum externum 11/23/2015  . Marital dysfunction 01/01/2015  . CAD (coronary artery disease) 07/22/2014  . Preventive measure 11/07/2012  . Insomnia 11/07/2012  . Perennial allergic rhinitis 11/07/2012  . H/O gastric bypass 10/09/2012  . Erectile dysfunction associated with type 2 diabetes mellitus ([place]) 11/14/2011  . S/P stroke due to cerebrovascular disease 11/14/2011  . Diabetes mellitus type 2, controlled ([place]) 11/14/2011    Past Surgical History:  Procedure Laterality Date  . APPENDECTOMY    . GASTRIC  BYPASS         Home Medications    Prior to Admission medications   Medication Sig Start Date End Date Taking? Authorizing Provider  AMBULATORY NON FORMULARY MEDICATION Single glucometer with lancets, test strips 07/19/15   Silverio Decamp, MD  benzonatate (TESSALON) 200 MG capsule Take 1 capsule (200 mg total) by mouth 3 (three) times daily as needed for cough. 03/29/16   Silverio Decamp, MD  clobetasol ointment (TEMOVATE) [id number] % Apply 1 application topically 2 (two) times daily. 10/05/15   Marcial Pacas, DO  hydrOXYzine (ATARAX/VISTARIL) 25 MG tablet Take 1 tablet (25 mg total) by mouth at bedtime. 07/02/15   Silverio Decamp, MD  metFORMIN (GLUCOPHAGE) 1000 MG tablet Take 1 tablet (1,000 mg total) by mouth daily with breakfast. 10/01/15   Marcial Pacas, DO  rizatriptan (MAXALT-MLT) 10 MG disintegrating tablet Take 1 tablet (10 mg total) by mouth as needed for migraine. May repeat in 2 hours if needed 02/23/16   Silverio Decamp, MD  tadalafil (CIALIS) 5 MG tablet Take 1 tablet (5 mg total) by mouth daily. 12/29/15   Silverio Decamp, MD    Family History Family History  Problem Relation Age of Onset  . Cancer Mother   . Stroke Father   . Heart disease Father     Social History Social History  Substance Use Topics  . Smoking status: Former Research scientist (life sciences)  . Smokeless tobacco: Never Used  . Alcohol use No     Allergies  Penicillins   Review of Systems Review of Systems  Cardiovascular: Negative for chest pain.  Musculoskeletal: Positive for arthralgias (right shoulder).  Skin: Negative for color change, rash and wound.  Neurological: Negative for numbness.       Tingling to right upper arm     Physical Exam Updated Vital Signs BP 128/86   Pulse 83   Temp 98.2 F (36.8 C)   Resp 16   Ht 5\' 7"  (1.702 m)   Wt 180 lb (81.6 kg)   SpO2 100%   BMI 28.19 kg/m   Physical Exam  Constitutional: He is oriented to person, place, and time. He appears  well-developed and well-nourished. No distress.  HENT:  Head: Normocephalic and atraumatic.  Eyes: EOM are normal.  Neck: Neck supple.  Cardiovascular: Normal rate, regular rhythm and normal heart sounds.  Exam reveals no gallop and no friction rub.   No murmur heard. Pulses:      Radial pulses are 2+ on the right side.  Pulmonary/Chest: Effort normal and breath sounds normal. No respiratory distress. He has no wheezes. He has no rales.  Abdominal: He exhibits no distension.  Musculoskeletal: Normal range of motion. He exhibits tenderness.       Right shoulder: He exhibits normal range of motion, no tenderness and no swelling.  Tightness of right trapezius. No TTP of right shoulder. Tenderness of bicep area with flexion of the elbow, but he is able to flex his elbow. Full ROM of right shoulder. No erythema, edema, or warmth. Distal sensation of right hand is intact.  Neurological: He is alert and oriented to person, place, and time.  Skin: Skin is warm and dry.  Psychiatric: He has a normal mood and affect. His behavior is normal.  Nursing note and vitals reviewed.    ED Treatments / Results  DIAGNOSTIC STUDIES: Oxygen Saturation is 100% on RA, nl by my interpretation.    COORDINATION OF CARE: 11:54 PM Discussed treatment plan with pt at bedside which includes right shoulder xray, ice, anti-inflammatory Rx and pt agreed to plan.   Radiology Dg Shoulder Right  Result Date: 05/17/2016 CLINICAL DATA:  RIGHT shoulder lifting injury 5 days ago. EXAM: RIGHT SHOULDER - 2+ VIEW COMPARISON:  None. FINDINGS: The humeral head is well-formed and located. The subacromial, glenohumeral and acromioclavicular joint spaces are intact. No destructive bony lesions. Soft tissue planes are non-suspicious. IMPRESSION: Negative. Electronically Signed   By: Elon Alas M.D.   On: 05/17/2016 23:35    Procedures Procedures (including critical care time)  Medications Ordered in ED Medications - No  data to display   Initial Impression / Assessment and Plan / ED Course  I have reviewed the triage vital signs and the nursing notes.  Pertinent imaging results that were available during my care of the patient were reviewed by me and considered in my medical decision making (see chart for details).  Clinical Course   Patient presents today with a chief complaint of right shoulder pain x 5 days after lifting a heavy object at work.  Xray is negative.  He has full ROM of his shoulder.  Neurovascularly intact.  No signs of infection of the shoulder.  Feel that the patient is stable for discharge.  Given referral to Ortho.  Stable for discharge.  Return precautions given.  Final Clinical Impressions(s) / ED Diagnoses   Final diagnoses:  None    New Prescriptions New Prescriptions   No medications on file   I personally performed  the services described in this documentation, which was scribed in my presence. The recorded information has been reviewed and is accurate.     Hyman Bible, PA-C 05/20/16 [place], DO 05/20/16 [place] Emerald

## 2016-05-17 NOTE — ED Notes (Signed)
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

## 2016-05-17 NOTE — ED Notes (Signed)
Patient transported to X-ray 

## 2016-05-17 NOTE — ED Triage Notes (Signed)
Pt c/o injury to right shoulder x 5 days ago at work

## 2016-05-18 MED ORDER — NAPROXEN 500 MG PO TABS: 500.0000 mg | ORAL_TABLET | Freq: Two times a day (BID) | ORAL | 0 refills | Status: DC

## 2016-05-18 MED ORDER — METHOCARBAMOL 500 MG PO TABS: 500.0000 mg | ORAL_TABLET | Freq: Two times a day (BID) | ORAL | 0 refills | Status: DC

## 2016-05-25 ENCOUNTER — Other Ambulatory Visit: Payer: Self-pay | Admitting: Sports Medicine

## 2016-06-27 ENCOUNTER — Encounter: Payer: Self-pay | Admitting: Sports Medicine

## 2016-06-27 ENCOUNTER — Ambulatory Visit (INDEPENDENT_AMBULATORY_CARE_PROVIDER_SITE_OTHER): Payer: Managed Care, Other (non HMO) | Admitting: Sports Medicine

## 2016-06-27 DIAGNOSIS — M5412 Radiculopathy, cervical region: Secondary | ICD-10-CM | POA: Diagnosis not present

## 2016-06-27 DIAGNOSIS — N521 Erectile dysfunction due to diseases classified elsewhere: Secondary | ICD-10-CM

## 2016-06-27 DIAGNOSIS — E1169 Type 2 diabetes mellitus with other specified complication: Secondary | ICD-10-CM | POA: Diagnosis not present

## 2016-06-27 DIAGNOSIS — E119 Type 2 diabetes mellitus without complications: Secondary | ICD-10-CM

## 2016-06-27 DIAGNOSIS — R21 Rash and other nonspecific skin eruption: Secondary | ICD-10-CM

## 2016-06-27 DIAGNOSIS — Z299 Encounter for prophylactic measures, unspecified: Secondary | ICD-10-CM

## 2016-06-27 MED ORDER — PREDNISONE 50 MG PO TABS: ORAL_TABLET | ORAL | 0 refills | Status: DC

## 2016-06-27 NOTE — Progress Notes (Signed)
  Subjective:    CC:  Skin rash  HPI: This is a pleasant 65 year old male, he had a right shoulder injury and was seen by his Worker's Comp. orthopedist, had intense shoulder injection. Subsequently developed a rash over his entire body, somewhat it she, minimally red that has since resolved.  He also endorses pain in his neck, radiating down the right arm to the hand.  Erectile dysfunction: No longer has any response to Cialis 20  Diabetes mellitus type 2: Due for some routine blood work  Past medical history:  Negative.  See flowsheet/record as well for more information.  Surgical history: Negative.  See flowsheet/record as well for more information.  Family history: Negative.  See flowsheet/record as well for more information.  Social history: Negative.  See flowsheet/record as well for more information.  Allergies, and medications have been entered into the medical record, reviewed, and no changes needed.   Review of Systems: No fevers, chills, night sweats, weight loss, chest pain, or shortness of breath.   Objective:    General: Well Developed, well nourished, and in no acute distress.  Neuro: Alert and oriented x3, extra-ocular muscles intact, sensation grossly intact.  HEENT: Normocephalic, atraumatic, pupils equal round reactive to light, neck supple, no masses, no lymphadenopathy, thyroid nonpalpable.  Skin: Warm and dry, no rashes. Cardiac: Regular rate and rhythm, no murmurs rubs or gallops, no lower extremity edema.  Respiratory: Clear to auscultation bilaterally. Not using accessory muscles, speaking in full sentences.  Impression and Recommendations:    Rash and nonspecific skin eruption After steroid injection into the shoulder by his Worker's Comp. Orthopedist. Currently resolved.  Right cervical radiculopathy Adding prednisone for 5 days, patient needs to get formal physical therapy for his neck added to his regimen. I would recommend doing this before  considering MRI and epidural's. This will however continue to be through his orthopedist and Worker's [employer].  Erectile dysfunction associated with type 2 diabetes mellitus Currently not having any efficacy with 20 mg of Cialis. I think it is time for the prostaglandin pellet or injection. He will let me know if he needs a referral to urology.  Diabetes mellitus type 2, controlled Adding routine blood work.

## 2016-06-27 NOTE — Assessment & Plan Note (Signed)
After steroid injection into the shoulder by his Worker's Comp. Orthopedist. Currently resolved.

## 2016-06-27 NOTE — Assessment & Plan Note (Signed)
Adding routine blood work. 

## 2016-06-27 NOTE — Assessment & Plan Note (Addendum)
Adding prednisone for 5 days, patient needs to get formal physical therapy for his neck added to his regimen. I would recommend doing this before considering MRI and epidural's. This will however continue to be through his orthopedist and Worker's [employer].

## 2016-06-27 NOTE — Assessment & Plan Note (Signed)
Currently not having any efficacy with 20 mg of Cialis. I think it is time for the prostaglandin pellet or injection. He will let me know if he needs a referral to urology.

## 2016-06-28 LAB — CBC
HCT: 38.3 % — ABNORMAL LOW (ref 38.5–50.0)
Hemoglobin: 12.6 g/dL — ABNORMAL LOW (ref 13.2–17.1)
MCH: 28.7 pg (ref 27.0–33.0)
MCHC: 32.9 g/dL (ref 32.0–36.0)
MCV: 87.2 fL (ref 80.0–100.0)
MPV: 9.5 fL (ref 7.5–12.5)
Platelets: 263 K/uL (ref 140–400)
RBC: 4.39 MIL/uL (ref 4.20–5.80)
RDW: 14.4 % (ref 11.0–15.0)
WBC: 6.3 K/uL (ref 3.8–10.8)

## 2016-06-28 LAB — COMPREHENSIVE METABOLIC PANEL WITH GFR
Albumin: 4.3 g/dL (ref 3.6–5.1)
Calcium: 9.5 mg/dL (ref 8.6–10.3)
Chloride: 103 mmol/L (ref 98–110)
Creat: 1.26 mg/dL — ABNORMAL HIGH (ref 0.70–1.25)
Potassium: 4.6 mmol/L (ref 3.5–5.3)
Total Protein: 7.2 g/dL (ref 6.1–8.1)

## 2016-06-28 LAB — COMPREHENSIVE METABOLIC PANEL
ALT: 12 U/L (ref 9–46)
AST: 14 U/L (ref 10–35)
Alkaline Phosphatase: 67 U/L (ref 40–115)
BUN: 24 mg/dL (ref 7–25)
CO2: 27 mmol/L (ref 20–31)
Glucose, Bld: 105 mg/dL — ABNORMAL HIGH (ref 65–99)
Sodium: 138 mmol/L (ref 135–146)
Total Bilirubin: 0.5 mg/dL (ref 0.2–1.2)

## 2016-06-28 LAB — TSH: TSH: 1.24 m[IU]/L (ref 0.40–4.50)

## 2016-06-28 LAB — LIPID PANEL
Cholesterol: 180 mg/dL (ref <200)
HDL: 90 mg/dL (ref >40)
LDL Cholesterol: 81 mg/dL (ref <100)
Total CHOL/HDL Ratio: 2 Ratio (ref <5.0)
Triglycerides: 46 mg/dL (ref <150)
VLDL: 9 mg/dL (ref <30)

## 2016-06-29 LAB — MICROALBUMIN / CREATININE URINE RATIO
Creatinine, Urine: 213 mg/dL (ref 20–370)
Microalb Creat Ratio: 7 ug/mg{creat} (ref <30)
Microalb, Ur: 1.4 mg/dL

## 2016-06-29 LAB — HIV ANTIBODY (ROUTINE TESTING W REFLEX): HIV 1&2 Ab, 4th Generation: NONREACTIVE

## 2016-06-29 LAB — HEMOGLOBIN A1C
Hgb A1c MFr Bld: 5.8 % — ABNORMAL HIGH (ref <5.7)
Mean Plasma Glucose: 120 mg/dL

## 2016-06-29 LAB — HEPATITIS C ANTIBODY: HCV Ab: NEGATIVE

## 2016-06-30 ENCOUNTER — Ambulatory Visit: Payer: Self-pay | Admitting: Sports Medicine

## 2016-07-25 ENCOUNTER — Encounter: Payer: Self-pay | Admitting: Sports Medicine

## 2016-07-25 ENCOUNTER — Ambulatory Visit (INDEPENDENT_AMBULATORY_CARE_PROVIDER_SITE_OTHER): Payer: Managed Care, Other (non HMO) | Admitting: Sports Medicine

## 2016-07-25 ENCOUNTER — Other Ambulatory Visit: Payer: Self-pay | Admitting: Sports Medicine

## 2016-07-25 DIAGNOSIS — Z0000 Encounter for general adult medical examination without abnormal findings: Secondary | ICD-10-CM

## 2016-07-25 DIAGNOSIS — N521 Erectile dysfunction due to diseases classified elsewhere: Secondary | ICD-10-CM | POA: Diagnosis not present

## 2016-07-25 DIAGNOSIS — E1169 Type 2 diabetes mellitus with other specified complication: Secondary | ICD-10-CM | POA: Diagnosis not present

## 2016-07-25 DIAGNOSIS — G4700 Insomnia, unspecified: Secondary | ICD-10-CM

## 2016-07-25 NOTE — Progress Notes (Signed)
  Subjective:    CC: Annual physical exam  HPI:  This is a pleasant 65 year old male here for his routine physical, he has no complaints with the exception of persistent erectile dysfunction. He has not yet discussed prostaglandin urethral tablets or intracavernosal injections yet with his urologist.  Past medical history:  Negative.  See flowsheet/record as well for more information.  Surgical history: Negative.  See flowsheet/record as well for more information.  Family history: Negative.  See flowsheet/record as well for more information.  Social history: Negative.  See flowsheet/record as well for more information.  Allergies, and medications have been entered into the medical record, reviewed, and no changes needed.    Review of Systems: No headache, visual changes, nausea, vomiting, diarrhea, constipation, dizziness, abdominal pain, skin rash, fevers, chills, night sweats, swollen lymph nodes, weight loss, chest pain, body aches, joint swelling, muscle aches, shortness of breath, mood changes, visual or auditory hallucinations.  Objective:    General: Well Developed, well nourished, and in no acute distress.  Neuro: Alert and oriented x3, extra-ocular muscles intact, sensation grossly intact. Cranial nerves II through XII are intact, motor, sensory, and coordinative functions are all intact. HEENT: Normocephalic, atraumatic, pupils equal round reactive to light, neck supple, no masses, no lymphadenopathy, thyroid nonpalpable. Oropharynx, nasopharynx, external ear canals are unremarkable. Skin: Warm and dry, no rashes noted.  Cardiac: Regular rate and rhythm, no murmurs rubs or gallops.  Respiratory: Clear to auscultation bilaterally. Not using accessory muscles, speaking in full sentences.  Abdominal: Soft, nontender, nondistended, positive bowel sounds, no masses, no organomegaly.  Musculoskeletal: Shoulder, elbow, wrist, hip, knee, ankle stable, and with full range of  motion.  Impression and Recommendations:    The patient was counselled, risk factors were discussed, anticipatory guidance given.  Annual physical exam Routine physical as above. Return in one year.  Erectile dysfunction associated with type 2 diabetes mellitus Has not had efficacy with any of the 5 phosphodiesterase inhibitors, he has not yet discussed prostaglandin urethral or injections with his urology.

## 2016-07-25 NOTE — Assessment & Plan Note (Signed)
Routine physical as above. Return in one year.

## 2016-07-25 NOTE — Assessment & Plan Note (Signed)
Has not had efficacy with any of the 5 phosphodiesterase inhibitors, he has not yet discussed prostaglandin urethral or injections with his urology.

## 2016-10-21 ENCOUNTER — Other Ambulatory Visit: Payer: Self-pay | Admitting: Sports Medicine

## 2016-10-23 ENCOUNTER — Other Ambulatory Visit: Payer: Self-pay | Admitting: Sports Medicine

## 2017-02-23 ENCOUNTER — Other Ambulatory Visit: Payer: Self-pay | Admitting: Sports Medicine

## 2017-06-15 ENCOUNTER — Other Ambulatory Visit: Payer: Self-pay | Admitting: Sports Medicine

## 2017-06-18 ENCOUNTER — Other Ambulatory Visit: Payer: Self-pay | Admitting: Sports Medicine

## 2017-06-18 ENCOUNTER — Ambulatory Visit (INDEPENDENT_AMBULATORY_CARE_PROVIDER_SITE_OTHER): Payer: 59 | Admitting: Sports Medicine

## 2017-06-18 VITALS — BP 139/80 | HR 79 | Resp 16 | Wt 192.0 lb

## 2017-06-18 DIAGNOSIS — E1169 Type 2 diabetes mellitus with other specified complication: Secondary | ICD-10-CM

## 2017-06-18 DIAGNOSIS — Z0000 Encounter for general adult medical examination without abnormal findings: Secondary | ICD-10-CM | POA: Diagnosis not present

## 2017-06-18 DIAGNOSIS — N521 Erectile dysfunction due to diseases classified elsewhere: Secondary | ICD-10-CM

## 2017-06-18 DIAGNOSIS — E119 Type 2 diabetes mellitus without complications: Secondary | ICD-10-CM

## 2017-06-18 DIAGNOSIS — Z23 Encounter for immunization: Secondary | ICD-10-CM

## 2017-06-18 MED ORDER — TADALAFIL 20 MG PO TABS: 20.0000 mg | ORAL_TABLET | Freq: Every day | ORAL | 11 refills | Status: DC | PRN

## 2017-06-18 MED ORDER — ALPROSTADIL (VASODILATOR) 10 MCG IC KIT: PACK | INTRACAVERNOUS | 12 refills | Status: DC

## 2017-06-18 NOTE — Assessment & Plan Note (Signed)
Annual physical as above, checking blood work.

## 2017-06-18 NOTE — Assessment & Plan Note (Addendum)
We have tried multiple modalities to treat his erectile dysfunction. Penile ring, 5 phosphodiesterase inhibitors, he did have a prostaglandin intracavernosal injection that seemed to provide 60% results. We will we have not yet done is a combination of all 3. I am going to write him a prescription for 20 mg Cialis, intracavernosal prostaglandin injections with needles, he will do his own up taper, he will get a new penis ring. I also like him to touch base with a local urologist just to start the discussions of a penile implant. He is unable to achieve or maintain erection through intercourse or masturbation. Once he gets his prostaglandin injections he can come here for instructions on how to do the injection.

## 2017-06-18 NOTE — Progress Notes (Signed)
  Subjective:    CC: Annual physical  HPI:  James Frye is here for his physical, he has a few complaints.  Erectile dysfunction: At this point we have tried multiple modalities including 5 phosphodiesterase inhibitors, compression rings, vacuums, he even tried a prostaglandin injection, it is not clear whether this was intracavernosal or intraurethral.  He never did the up titration on the prostaglandins.  Symptoms are severe.  Diabetes mellitus type 2: Diabetic eye exam was 2 months ago, foot exam today.  Past medical history:  Negative.  See flowsheet/record as well for more information.  Surgical history: Negative.  See flowsheet/record as well for more information.  Family history: Negative.  See flowsheet/record as well for more information.  Social history: Negative.  See flowsheet/record as well for more information.  Allergies, and medications have been entered into the medical record, reviewed, and no changes needed.    Review of Systems: No headache, visual changes, nausea, vomiting, diarrhea, constipation, dizziness, abdominal pain, skin rash, fevers, chills, night sweats, swollen lymph nodes, weight loss, chest pain, body aches, joint swelling, muscle aches, shortness of breath, mood changes, visual or auditory hallucinations.  Objective:    General: Well Developed, well nourished, and in no acute distress.  Neuro: Alert and oriented x3, extra-ocular muscles intact, sensation grossly intact. Cranial nerves II through XII are intact, motor, sensory, and coordinative functions are all intact. HEENT: Normocephalic, atraumatic, pupils equal round reactive to light, neck supple, no masses, no lymphadenopathy, thyroid nonpalpable. Oropharynx, nasopharynx, external ear canals are unremarkable. Skin: Warm and dry, no rashes noted.  Cardiac: Regular rate and rhythm, no murmurs rubs or gallops.  Respiratory: Clear to auscultation bilaterally. Not using accessory muscles, speaking in full  sentences.  Abdominal: Soft, nontender, nondistended, positive bowel sounds, no masses, no organomegaly.  Musculoskeletal: Shoulder, elbow, wrist, hip, knee, ankle stable, and with full range of motion.  Impression and Recommendations:    The patient was counselled, risk factors were discussed, anticipatory guidance given.  Annual physical exam Annual physical as above, checking blood work.  Diabetes mellitus type 2, controlled Rechecking labs including hemoglobin A1c. Due for eye exam, foot exam.  Erectile dysfunction associated with type 2 diabetes mellitus We have tried multiple modalities to treat his erectile dysfunction. Penile ring, 5 phosphodiesterase inhibitors, he did have a prostaglandin intracavernosal injection that seemed to provide 60% results. We will we have not yet done is a combination of all 3. I am going to write him a prescription for 20 mg Cialis, intracavernosal prostaglandin injections with needles, he will do his own up taper, he will get a new penis ring. I also like him to touch base with a local urologist just to start the discussions of a penile implant. He is unable to achieve or maintain erection through intercourse or masturbation. Once he gets his prostaglandin injections he can come here for instructions on how to do the injection.  ___________________________________________ [name]. Dianah Field, M.D., ABFM., CAQSM. Primary Care and [place] Instructor of [place] of [facility] of Medicine

## 2017-06-18 NOTE — Assessment & Plan Note (Addendum)
Rechecking labs including hemoglobin A1c. Due for eye exam, foot exam.

## 2017-06-19 LAB — HEMOGLOBIN A1C
Hgb A1c MFr Bld: 6 %{Hb} — ABNORMAL HIGH (ref <5.7)
Mean Plasma Glucose: 126 (calc)
eAG (mmol/L): 7 (calc)

## 2017-06-19 LAB — LIPID PANEL W/REFLEX DIRECT LDL
Cholesterol: 176 mg/dL (ref <200)
HDL: 99 mg/dL (ref >40)
LDL Cholesterol (Calc): 64 mg/dL (calc)
Non-HDL Cholesterol (Calc): 77 mg/dL (ref <130)
Total CHOL/HDL Ratio: 1.8 (calc) (ref <5.0)
Triglycerides: 53 mg/dL (ref <150)

## 2017-06-19 LAB — CBC
HCT: 37.2 % — ABNORMAL LOW (ref 38.5–50.0)
Hemoglobin: 12.3 g/dL — ABNORMAL LOW (ref 13.2–17.1)
MCH: 28.9 pg (ref 27.0–33.0)
MCHC: 33.1 g/dL (ref 32.0–36.0)
MCV: 87.5 fL (ref 80.0–100.0)
MPV: 10.2 fL (ref 7.5–12.5)
Platelets: 239 Thousand/uL (ref 140–400)
RBC: 4.25 10*6/uL (ref 4.20–5.80)
RDW: 13 % (ref 11.0–15.0)
WBC: 5.8 Thousand/uL (ref 3.8–10.8)

## 2017-06-19 LAB — COMPREHENSIVE METABOLIC PANEL
AG Ratio: 1.3 (calc) (ref 1.0–2.5)
ALT: 15 U/L (ref 9–46)
Albumin: 4.2 g/dL (ref 3.6–5.1)
BUN/Creatinine Ratio: 13 (calc) (ref 6–22)
BUN: 19 mg/dL (ref 7–25)
CO2: 29 mmol/L (ref 20–32)
Calcium: 9.4 mg/dL (ref 8.6–10.3)
Glucose, Bld: 108 mg/dL — ABNORMAL HIGH (ref 65–99)
Potassium: 4.7 mmol/L (ref 3.5–5.3)
Sodium: 140 mmol/L (ref 135–146)
Total Bilirubin: 0.6 mg/dL (ref 0.2–1.2)
Total Protein: 7.4 g/dL (ref 6.1–8.1)

## 2017-06-19 LAB — COMPREHENSIVE METABOLIC PANEL WITH GFR
AST: 17 U/L (ref 10–35)
Alkaline phosphatase (APISO): 64 U/L (ref 40–115)
Chloride: 105 mmol/L (ref 98–110)
Creat: 1.46 mg/dL — ABNORMAL HIGH (ref 0.70–1.25)
Globulin: 3.2 g/dL (ref 1.9–3.7)

## 2017-06-21 ENCOUNTER — Encounter: Payer: Self-pay | Admitting: Sports Medicine

## 2017-07-12 ENCOUNTER — Encounter: Payer: Self-pay | Admitting: Sports Medicine

## 2017-07-17 ENCOUNTER — Ambulatory Visit (INDEPENDENT_AMBULATORY_CARE_PROVIDER_SITE_OTHER): Payer: 59 | Admitting: Sports Medicine

## 2017-07-17 DIAGNOSIS — N521 Erectile dysfunction due to diseases classified elsewhere: Secondary | ICD-10-CM | POA: Diagnosis not present

## 2017-07-17 DIAGNOSIS — E1169 Type 2 diabetes mellitus with other specified complication: Secondary | ICD-10-CM

## 2017-07-17 NOTE — Assessment & Plan Note (Addendum)
5 mcg injected intracavernosal, may increase by 5 mcg daily until adequate erection or 60 mcg max dose. He did have a minimal response today, we will go up to 10 mcg next, he has a single additional injector and I would like him to do the full process himself. We will then go to 20 mcg and then 40 mcg rather than doing a slow dose up titration per his request. Edex comes in 10 mcg, 20 mcg, 40 mcg

## 2017-07-17 NOTE — Progress Notes (Signed)
  Subjective:    CC: Intracavernosal injections  HPI: James Frye returns, he is failed many 5 phosphodiesterase inhibitors as well as other therapies, here to learn alprostadil injections.  Past medical history:  Negative.  See flowsheet/record as well for more information.  Surgical history: Negative.  See flowsheet/record as well for more information.  Family history: Negative.  See flowsheet/record as well for more information.  Social history: Negative.  See flowsheet/record as well for more information.  Allergies, and medications have been entered into the medical record, reviewed, and no changes needed.   Review of Systems: No fevers, chills, night sweats, weight loss, chest pain, or shortness of breath.   Objective:    General: Well Developed, well nourished, and in no acute distress.  Neuro: Alert and oriented x3, extra-ocular muscles intact, sensation grossly intact.  HEENT: Normocephalic, atraumatic, pupils equal round reactive to light, neck supple, no masses, no lymphadenopathy, thyroid nonpalpable.  Skin: Warm and dry, no rashes. Cardiac: Regular rate and rhythm, no murmurs rubs or gallops, no lower extremity edema.  Respiratory: Clear to auscultation bilaterally. Not using accessory muscles, speaking in full sentences.  With the patient observing I drew up 5 mcg of alprostadil from a 10 mcg/mL injector device, cleaned the site and injected at the right sided position perpendicularly into the corpus cavernosum, injection performed at 9:00 AM.  After about 20 minutes we noticed good improvement, without solid full erection.  Impression and Recommendations:    Erectile dysfunction associated with type 2 diabetes mellitus 5 mcg injected intracavernosal, may increase by 5 mcg daily until adequate erection or 60 mcg max dose. He did have a minimal response today, we will go up to 10 mcg next, he has a single additional injector and I would like him to do the full process  himself. We will then go to 20 mcg and then 40 mcg rather than doing a slow dose up titration per his request. Edex comes in 10 mcg, 20 mcg, 40 mcg  I spent 25 minutes with this patient, greater than 50% was face-to-face time counseling regarding the above diagnoses ___________________________________________ [name]. Dianah Field, M.D., ABFM., CAQSM. Primary Care and [place] Instructor of [place] of [facility] of Medicine

## 2017-07-19 ENCOUNTER — Encounter: Payer: Self-pay | Admitting: Sports Medicine

## 2017-07-19 ENCOUNTER — Ambulatory Visit (INDEPENDENT_AMBULATORY_CARE_PROVIDER_SITE_OTHER): Payer: 59 | Admitting: Sports Medicine

## 2017-07-19 DIAGNOSIS — E1169 Type 2 diabetes mellitus with other specified complication: Secondary | ICD-10-CM | POA: Diagnosis not present

## 2017-07-19 DIAGNOSIS — N521 Erectile dysfunction due to diseases classified elsewhere: Secondary | ICD-10-CM

## 2017-07-19 MED ORDER — ALPROSTADIL (VASODILATOR) 20 MCG IC KIT: 20.0000 ug | PACK | INTRACAVERNOUS | 12 refills | Status: DC | PRN

## 2017-07-19 NOTE — Progress Notes (Signed)
  Subjective:    CC: Erectile dysfunction  HPI: James Frye returns, 2 days ago we did a 5 mcg alprostadil injection into his right corpus cavernosum, he had a partial response.  Today he is here to discuss doing a 10 mg injection.  He is agreeable to do the procedure himself.  Past medical history:  Negative.  See flowsheet/record as well for more information.  Surgical history: Negative.  See flowsheet/record as well for more information.  Family history: Negative.  See flowsheet/record as well for more information.  Social history: Negative.  See flowsheet/record as well for more information.  Allergies, and medications have been entered into the medical record, reviewed, and no changes needed.   (To billers/coders, pertinent past medical, social, surgical, family history can be found in problem list, if problem list is marked as reviewed then this indicates that past medical, social, surgical, family history was also reviewed)  Review of Systems: No fevers, chills, night sweats, weight loss, chest pain, or shortness of breath.   Objective:    General: Well Developed, well nourished, and in no acute distress.  Neuro: Alert and oriented x3, extra-ocular muscles intact, sensation grossly intact.  HEENT: Normocephalic, atraumatic, pupils equal round reactive to light, neck supple, no masses, no lymphadenopathy, thyroid nonpalpable.  Skin: Warm and dry, no rashes. Cardiac: Regular rate and rhythm, no murmurs rubs or gallops, no lower extremity edema.  Respiratory: Clear to auscultation bilaterally. Not using accessory muscles, speaking in full sentences.  We observed the patient do the injection, he reconstituted the medication himself, appropriately cleaned off the penis, and performed the injection into the left corpus cavernosum.  After 20 minutes he had a solid and full erection.  Before we let him go the erection had detumesced.  Impression and Recommendations:    Erectile dysfunction  associated with type 2 diabetes mellitus 10 mcg injected today intracavernosal on the left at 11:09 AM. The patient was able to perform the injection himself showing good technique. He had a very good response. Duration was not optimal so we are increasing to 20 mcg, he can do the shots at home. He will call me if he has an erection that is either painful or lasts longer than 4 hours, or if he needs 40 mcg.  I spent 25 minutes with this patient, greater than 50% was face-to-face time counseling regarding the above diagnoses ___________________________________________ James Frye. James Frye, M.D., ABFM., CAQSM. Primary Care and [place] Instructor of [place] of [facility] of Medicine

## 2017-07-19 NOTE — Assessment & Plan Note (Addendum)
10 mcg injected today intracavernosal on the left at 11:09 AM. The patient was able to perform the injection himself showing good technique. He had a very good response. Duration was not optimal so we are increasing to 20 mcg, he can do the shots at home. He will call me if he has an erection that is either painful or lasts longer than 4 hours, or if he needs 40 mcg.

## 2017-08-17 ENCOUNTER — Other Ambulatory Visit: Payer: Self-pay | Admitting: Sports Medicine

## 2017-08-17 DIAGNOSIS — G4700 Insomnia, unspecified: Secondary | ICD-10-CM

## 2017-08-22 ENCOUNTER — Encounter: Payer: Self-pay | Admitting: Sports Medicine

## 2017-08-22 ENCOUNTER — Ambulatory Visit (INDEPENDENT_AMBULATORY_CARE_PROVIDER_SITE_OTHER): Payer: 59 | Admitting: Sports Medicine

## 2017-08-22 DIAGNOSIS — E1169 Type 2 diabetes mellitus with other specified complication: Secondary | ICD-10-CM | POA: Diagnosis not present

## 2017-08-22 DIAGNOSIS — L853 Xerosis cutis: Secondary | ICD-10-CM | POA: Insufficient documentation

## 2017-08-22 DIAGNOSIS — N521 Erectile dysfunction due to diseases classified elsewhere: Secondary | ICD-10-CM | POA: Diagnosis not present

## 2017-08-22 DIAGNOSIS — J3089 Other allergic rhinitis: Secondary | ICD-10-CM | POA: Diagnosis not present

## 2017-08-22 DIAGNOSIS — E119 Type 2 diabetes mellitus without complications: Secondary | ICD-10-CM | POA: Diagnosis not present

## 2017-08-22 MED ORDER — BENZONATATE 200 MG PO CAPS: 200.0000 mg | ORAL_CAPSULE | Freq: Three times a day (TID) | ORAL | 0 refills | Status: DC | PRN

## 2017-08-22 MED ORDER — CLOBETASOL PROPIONATE 0.05 % EX OINT: 1.0000 "application " | TOPICAL_OINTMENT | Freq: Two times a day (BID) | CUTANEOUS | 0 refills | Status: DC

## 2017-08-22 NOTE — Assessment & Plan Note (Signed)
Needs urine microalbumin  

## 2017-08-22 NOTE — Assessment & Plan Note (Signed)
Did well with the deck, he is seeing the urologist and is going to try TriMix as well.

## 2017-08-22 NOTE — Assessment & Plan Note (Signed)
In a persistent nocturnal cough, having a persistent nocturnal cough. Is only been present for a few weeks now, adding some Tessalon Perles, benign exam. If persistence of cough we will proceed with further workup.

## 2017-08-22 NOTE — Assessment & Plan Note (Signed)
Refilling clobetasol.

## 2017-08-22 NOTE — Progress Notes (Signed)
  Subjective:    CC: Follow-up several issues  HPI: Cough: Present for the past couple of weeks, only occurs in the evening, before and after showers but not at night, no constitutional symptoms, cough is nonproductive, no recent upper respiratory illnesses, no identifiable triggers.  No chest pain, no shortness of breath.  Erectile dysfunction: Has started TriMix with urology.  Skin dryness: He historically controlled with clobetasol, needs a refill.  Diabetes mellitus type 2: Due for urine micro albumin.  I reviewed the past medical history, family history, social history, surgical history, and allergies today and no changes were needed.  Please see the problem list section below in epic for further details.  Past Medical History: Past Medical History:  Diagnosis Date  . CAD (coronary artery disease)   . Diabetes mellitus   . Hiatal hernia    Past Surgical History: Past Surgical History:  Procedure Laterality Date  . APPENDECTOMY    . GASTRIC BYPASS     Social History: Social History   Socioeconomic History  . Marital status: Married    Spouse name: None  . Number of children: 3  . Years of education: None  . Highest education level: None  Social Needs  . Financial resource strain: None  . Food insecurity - worry: None  . Food insecurity - inability: None  . Transportation needs - medical: None  . Transportation needs - non-medical: None  Occupational History  . None  Tobacco Use  . Smoking status: Former Research scientist (life sciences)  . Smokeless tobacco: Never Used  Substance and Sexual Activity  . Alcohol use: No  . Drug use: No  . Sexual activity: Yes  Other Topics Concern  . None  Social History Narrative  . None   Family History: Family History  Problem Relation Age of Onset  . Cancer Mother   . Stroke Father   . Heart disease Father    Allergies: Allergies  Allergen Reactions  . Penicillins Rash   Medications: See med rec.  Review of Systems: No fevers,  chills, night sweats, weight loss, chest pain, or shortness of breath.   Objective:    General: Well Developed, well nourished, and in no acute distress.  Neuro: Alert and oriented x3, extra-ocular muscles intact, sensation grossly intact.  HEENT: Normocephalic, atraumatic, pupils equal round reactive to light, neck supple, no masses, no lymphadenopathy, thyroid nonpalpable.  Skin: Warm and dry, no rashes.  Slight scaliness on the upper back, does not resemble tinea versicolor. Cardiac: Regular rate and rhythm, no murmurs rubs or gallops, no lower extremity edema.  Respiratory: Clear to auscultation bilaterally. Not using accessory muscles, speaking in full sentences.  Impression and Recommendations:    Dry skin Refilling clobetasol.  Erectile dysfunction associated with type 2 diabetes mellitus Did well with the deck, he is seeing the urologist and is going to try TriMix as well.  Perennial allergic rhinitis In a persistent nocturnal cough, having a persistent nocturnal cough. Is only been present for a few weeks now, adding some Tessalon Perles, benign exam. If persistence of cough we will proceed with further workup.  Diabetes mellitus type 2, controlled Needs urine microalbumin.  ___________________________________________ [name]. Dianah Field, M.D., ABFM., CAQSM. Primary Care and [employer] Instructor of [place] of [facility] of Medicine

## 2017-08-23 LAB — MICROALBUMIN / CREATININE URINE RATIO
Creatinine, Urine: 179 mg/dL (ref 20–320)
Microalb Creat Ratio: 4 mcg/mg creat (ref <30)
Microalb, Ur: 0.8 mg/dL

## 2017-08-30 ENCOUNTER — Other Ambulatory Visit: Payer: Self-pay

## 2017-08-30 DIAGNOSIS — G4700 Insomnia, unspecified: Secondary | ICD-10-CM

## 2017-08-30 MED ORDER — METFORMIN HCL 1000 MG PO TABS: ORAL_TABLET | ORAL | 3 refills | Status: DC

## 2017-08-30 MED ORDER — HYDROXYZINE HCL 25 MG PO TABS: 25.0000 mg | ORAL_TABLET | Freq: Every day | ORAL | 3 refills | Status: DC

## 2017-08-30 MED ORDER — AMBULATORY NON FORMULARY MEDICATION: 0 refills | Status: DC

## 2017-09-17 ENCOUNTER — Encounter: Payer: Self-pay | Admitting: Sports Medicine

## 2017-09-17 MED ORDER — FLUTICASONE PROPIONATE 50 MCG/ACT NA SUSP: NASAL | 3 refills | Status: DC

## 2018-01-28 ENCOUNTER — Other Ambulatory Visit: Payer: Self-pay | Admitting: Sports Medicine

## 2018-04-23 ENCOUNTER — Other Ambulatory Visit: Payer: Self-pay | Admitting: *Deleted

## 2018-04-23 MED ORDER — METFORMIN HCL 1000 MG PO TABS: ORAL_TABLET | ORAL | 1 refills | Status: DC

## 2018-05-02 ENCOUNTER — Ambulatory Visit (INDEPENDENT_AMBULATORY_CARE_PROVIDER_SITE_OTHER): Payer: Medicare Other | Admitting: Sports Medicine

## 2018-05-02 ENCOUNTER — Encounter: Payer: Self-pay | Admitting: Sports Medicine

## 2018-05-02 VITALS — BP 153/77 | HR 92 | Ht 67.0 in | Wt 182.0 lb

## 2018-05-02 DIAGNOSIS — D649 Anemia, unspecified: Secondary | ICD-10-CM | POA: Diagnosis not present

## 2018-05-02 DIAGNOSIS — R42 Dizziness and giddiness: Secondary | ICD-10-CM

## 2018-05-02 DIAGNOSIS — E538 Deficiency of other specified B group vitamins: Secondary | ICD-10-CM

## 2018-05-02 NOTE — Assessment & Plan Note (Signed)
No acoustic symptoms, no cardiac symptoms. Getting some labs, and adding vestibular rehabilitation. We discussed the pathophysiology of benign positional vertigo. Return to see me in a month, if persistent symptoms we will get a brain MRI.

## 2018-05-02 NOTE — Patient Instructions (Signed)

## 2018-05-02 NOTE — Progress Notes (Addendum)
Subjective:    CC: Feeling dizzy  HPI: This is a pleasant 67 year old male, he has been working very hard, on a few occasions he has had episodes where he feels as though the room is spinning horizontally, no headaches before, during, after, no visual changes, no dysarthria, weakness on either side of the body, no trauma, denies any ringing in the ears or sensation of fullness.  These symptoms resolve after a few minutes.  No chest pain, shortness of breath.  I reviewed the past medical history, family history, social history, surgical history, and allergies today and no changes were needed.  Please see the problem list section below in epic for further details.  Past Medical History: Past Medical History:  Diagnosis Date  . CAD (coronary artery disease)   . Diabetes mellitus   . Hiatal hernia    Past Surgical History: Past Surgical History:  Procedure Laterality Date  . APPENDECTOMY    . GASTRIC BYPASS     Social History: Social History   Socioeconomic History  . Marital status: Married    Spouse name: Not on file  . Number of children: 3  . Years of education: Not on file  . Highest education level: Not on file  Occupational History  . Not on file  Social Needs  . Financial resource strain: Not on file  . Food insecurity:    Worry: Not on file    Inability: Not on file  . Transportation needs:    Medical: Not on file    Non-medical: Not on file  Tobacco Use  . Smoking status: Former Research scientist (life sciences)  . Smokeless tobacco: Never Used  Substance and Sexual Activity  . Alcohol use: No  . Drug use: No  . Sexual activity: Yes  Lifestyle  . Physical activity:    Days per week: Not on file    Minutes per session: Not on file  . Stress: Not on file  Relationships  . Social connections:    Talks on phone: Not on file    Gets together: Not on file    Attends religious service: Not on file    Active member of club or organization: Not on file    Attends meetings of clubs or  organizations: Not on file    Relationship status: Not on file  Other Topics Concern  . Not on file  Social History Narrative  . Not on file   Family History: Family History  Problem Relation Age of Onset  . Cancer Mother   . Stroke Father   . Heart disease Father    Allergies: Allergies  Allergen Reactions  . Penicillins Rash   Medications: See med rec.  Review of Systems: No fevers, chills, night sweats, weight loss, chest pain, or shortness of breath.   Objective:    General: Well Developed, well nourished, and in no acute distress.  Neuro: Alert and oriented x3, extra-ocular muscles intact, sensation grossly intact.  Cranial nerves II through XII are intact, motor, sensory, coordinative functions are all intact.  We did not do the Dix-Hallpike test today. HEENT: Normocephalic, atraumatic, pupils equal round reactive to light, neck supple, no masses, no lymphadenopathy, thyroid nonpalpable.  Skin: Warm and dry, no rashes. Cardiac: Regular rate and rhythm, no murmurs rubs or gallops, no lower extremity edema.  Respiratory: Clear to auscultation bilaterally. Not using accessory muscles, speaking in full sentences.  Impression and Recommendations:    Vertigo No acoustic symptoms, no cardiac symptoms. Getting some labs, and adding  vestibular rehabilitation. We discussed the pathophysiology of benign positional vertigo. Return to see me in a month, if persistent symptoms we will get a brain MRI.  Normocytic anemia Labs show normocytic anemia,I am going to check an anemia panel, and we need to determine why he is not producing enough red blood cells.  This could be responsible for his symptoms.  Ferritin levels are low, I would like a gastroenterology evaluation, he also needs to start iron sulfate 325, 3 times daily, I will call this in.  I spent 25 minutes with this patient, greater than 50% was face-to-face time counseling regarding the above diagnoses, specifically the  anatomy and pathophysiology behind benign positional vertigo. ___________________________________________ [name]. Dianah Field, M.D., ABFM., CAQSM. Primary Care and [place] Instructor of [place] [facility] of Medicine

## 2018-05-03 DIAGNOSIS — R42 Dizziness and giddiness: Secondary | ICD-10-CM | POA: Diagnosis not present

## 2018-05-03 DIAGNOSIS — R7989 Other specified abnormal findings of blood chemistry: Secondary | ICD-10-CM | POA: Diagnosis not present

## 2018-05-03 DIAGNOSIS — E119 Type 2 diabetes mellitus without complications: Secondary | ICD-10-CM | POA: Diagnosis not present

## 2018-05-03 DIAGNOSIS — Z1321 Encounter for screening for nutritional disorder: Secondary | ICD-10-CM | POA: Diagnosis not present

## 2018-05-03 DIAGNOSIS — Z9884 Bariatric surgery status: Secondary | ICD-10-CM | POA: Diagnosis not present

## 2018-05-04 DIAGNOSIS — D649 Anemia, unspecified: Secondary | ICD-10-CM | POA: Insufficient documentation

## 2018-05-04 NOTE — Addendum Note (Signed)
Addended by: Silverio Decamp on: 05/04/2018 01:31 PM   Modules accepted: Orders

## 2018-05-04 NOTE — Assessment & Plan Note (Addendum)
Labs show normocytic anemia,I am going to check an anemia panel, and we need to determine why he is not producing enough red blood cells.  This could be responsible for his symptoms.  Ferritin levels are low, I would like a gastroenterology evaluation, he also needs to start iron sulfate 325, 3 times daily, I will call this in.

## 2018-05-07 LAB — TEST AUTHORIZATION

## 2018-05-07 LAB — COMPREHENSIVE METABOLIC PANEL
ALT: 11 U/L (ref 9–46)
AST: 12 U/L (ref 10–35)
Alkaline phosphatase (APISO): 65 U/L (ref 40–115)
CO2: 28 mmol/L (ref 20–32)
Calcium: 9.6 mg/dL (ref 8.6–10.3)
Glucose, Bld: 102 mg/dL — ABNORMAL HIGH (ref 65–99)
Total Bilirubin: 0.7 mg/dL (ref 0.2–1.2)

## 2018-05-07 LAB — COMPREHENSIVE METABOLIC PANEL WITH GFR
AG Ratio: 1.8 (calc) (ref 1.0–2.5)
Albumin: 4.2 g/dL (ref 3.6–5.1)
BUN/Creatinine Ratio: 16 (calc) (ref 6–22)
BUN: 21 mg/dL (ref 7–25)
Chloride: 104 mmol/L (ref 98–110)
Creat: 1.3 mg/dL — ABNORMAL HIGH (ref 0.70–1.25)
Globulin: 2.4 g/dL (ref 1.9–3.7)
Potassium: 4.7 mmol/L (ref 3.5–5.3)
Sodium: 141 mmol/L (ref 135–146)
Total Protein: 6.6 g/dL (ref 6.1–8.1)

## 2018-05-07 LAB — CBC
HCT: 37.2 % — ABNORMAL LOW (ref 38.5–50.0)
Hemoglobin: 12 g/dL — ABNORMAL LOW (ref 13.2–17.1)
MCH: 28.6 pg (ref 27.0–33.0)
MCHC: 32.3 g/dL (ref 32.0–36.0)
MCV: 88.8 fL (ref 80.0–100.0)
MPV: 10 fL (ref 7.5–12.5)
Platelets: 243 10*3/uL (ref 140–400)
RBC: 4.19 Million/uL — ABNORMAL LOW (ref 4.20–5.80)
RDW: 12.9 % (ref 11.0–15.0)
WBC: 5.4 Thousand/uL (ref 3.8–10.8)

## 2018-05-07 LAB — IRON, TOTAL/TOTAL IRON BINDING CAP
%SAT: 31 % (calc) (ref 20–48)
Iron: 106 ug/dL (ref 50–180)
TIBC: 341 mcg/dL (calc) (ref 250–425)

## 2018-05-07 LAB — B12 AND FOLATE PANEL
Folate: 6.7 ng/mL
Vitamin B-12: 1961 pg/mL — ABNORMAL HIGH (ref 200–1100)

## 2018-05-07 LAB — FERRITIN: Ferritin: 21 ng/mL — ABNORMAL LOW (ref 24–380)

## 2018-05-07 MED ORDER — FERROUS SULFATE 325 (65 FE) MG PO TBEC: 325.0000 mg | DELAYED_RELEASE_TABLET | Freq: Three times a day (TID) | ORAL | 11 refills | Status: DC

## 2018-05-07 NOTE — Addendum Note (Signed)
Addended by: Silverio Decamp on: 05/07/2018 09:19 AM   Modules accepted: Orders

## 2018-05-08 ENCOUNTER — Ambulatory Visit (INDEPENDENT_AMBULATORY_CARE_PROVIDER_SITE_OTHER): Payer: Medicare Other | Admitting: Physical Therapy

## 2018-05-08 ENCOUNTER — Encounter: Payer: Self-pay | Admitting: Physical Therapy

## 2018-05-08 DIAGNOSIS — R42 Dizziness and giddiness: Secondary | ICD-10-CM | POA: Diagnosis not present

## 2018-05-08 NOTE — Therapy (Signed)
[facility] [place], [place], [address] Phone: [phone]   Fax:  [phone]  Physical Therapy Evaluation  Patient Details  Name: James Frye MRN: [id number] Date of Birth: [birth year] Referring Provider: Silverio Decamp, MD   Encounter Date: 05/08/2018  PT End of Session - 05/08/18 1141    Visit Number  1    Number of Visits  6    Date for PT Re-Evaluation  06/19/18    Authorization Type  Medicare/Cigna    PT Start Time  1032    PT Stop Time  1108    PT Time Calculation (min)  36 min    Activity Tolerance  Patient tolerated treatment well    Behavior During Therapy  [facility] for tasks assessed/performed       Past Medical History:  Diagnosis Date  . CAD (coronary artery disease)   . Diabetes mellitus   . Hiatal hernia     Past Surgical History:  Procedure Laterality Date  . APPENDECTOMY    . GASTRIC BYPASS      There were no vitals filed for this visit.   Subjective Assessment - 05/08/18 1034    Subjective  Pt is a 67 y/o male who presents to OPPT for vertigo, c/o spinning sensation when walking.  Pt reports lab work revealed low iron, and plans to pick up prescription today.  Pt unsure if vertigo related to low RBCs or true vestibular dysfunction.    Pertinent History  gastric bypass, DM, CAD    Patient Stated Goals  see if there's anything PT can do for dizziness    Currently in Pain?  No/denies         [facility] PT Assessment - 05/08/18 1039      Assessment   Medical Diagnosis  vertigo    Referring Provider  Silverio Decamp, MD    Onset Date/Surgical Date  --   3 weeks   Next MD Visit  06/06/18    Prior Therapy  n/a      Precautions   Precautions  None      Restrictions   Weight Bearing Restrictions  No      Balance Screen   Has the patient fallen in the past 6 months  No    Has the patient had a decrease in activity level because of a fear of falling?   No    Is  the patient reluctant to leave their home because of a fear of falling?   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      Cognition   Overall Cognitive Status  Within Functional Limits for tasks assessed      High Level Balance   High Level Balance Comments  increased sway on compliant surface with EC and feet together; LOB when adding head turns           Vestibular Assessment - 05/08/18 1041      Vestibular Assessment   General Observation  no symptoms x 1 week      Symptom Behavior   Type of Dizziness  Imbalance   spinning (slow   Frequency  of Dizziness  1x/day    Duration of Dizziness  5-10 min    Aggravating Factors  Spontaneous onset;No known aggravating factors    Relieving Factors  Comments   stand still and wait for symptoms to resolve     Occulomotor Exam   Occulomotor Alignment  Normal    Spontaneous  Absent    Gaze-induced  Absent    Smooth Pursuits  Intact    Saccades  Intact      Vestibulo-Occular Reflex   VOR 1 Head Only (x 1 viewing)  WNL    Comment  HIT (-) bil      Positional Testing   Dix-Hallpike  Dix-Hallpike Right;Dix-Hallpike Left    Horizontal Canal Testing  Horizontal Canal Right;Horizontal Canal Left      Dix-Hallpike Right   Dix-Hallpike Right Duration  none    Dix-Hallpike Right Symptoms  No nystagmus      Dix-Hallpike Left   Dix-Hallpike Left Duration  none    Dix-Hallpike Left Symptoms  No nystagmus      Horizontal Canal Right   Horizontal Canal Right Duration  none    Horizontal Canal Right Symptoms  Normal      Horizontal Canal Left   Horizontal Canal Left Duration  none    Horizontal Canal Left Symptoms  Normal           Objective measurements completed on examination: See above findings.           Balance Exercises - 05/08/18 1137      Balance Exercises: Standing   Standing Eyes Closed  Foam/compliant surface;Wide (BOA);Narrow base of support (BOS);Head turns        PT Education - 05/08/18 1141    Education Details  HEP, clinical findings, systems for balance    Person(s) Educated  Patient    Methods  Explanation;Demonstration;Handout    Comprehension  Verbalized understanding;Returned demonstration          PT Long Term Goals - 05/08/18 1145      PT LONG TERM GOAL #1   Title  to be determined if pt returns             Plan - 05/08/18 1141    Clinical Impression Statement  Pt is a 67 y/o male who presents to OPPT for vertigo.  Pt reports initial symptoms about 3 weeks ago, but no episodes for about 1 week.  Unable to provoke symptoms in clinic today, but pt demonstrated decreased vestibular input with LOB on compliant surface with eyes closed.  Pt given HEP to address balance and vestibular input.  At this time, feel PT is not indicated as unable to provoke symptoms and he's been asymptomatic x 1 week.  Will hold chart open x 1 month and if symptoms return in that time advised pt to return to clinic.  Feel symptoms likley multifactorial including mild decreased vestibular input as well as abnormal lab values contributing to symptoms.      History and Personal Factors relevant to plan of care:  gastric bypass, DM, CAD    Clinical Presentation  Stable    Clinical Decision Making  Low    Rehab Potential  Good    PT Frequency  1x / week   PRN up to 1x/wk x 6 weeks   PT Duration  6 weeks    PT Treatment/Interventions  ADLs/Self Care Home Management;Canalith Repostioning;Cryotherapy;Moist Heat;Balance training;Therapeutic exercise;Therapeutic activities;Functional mobility training;Neuromuscular re-education;Gait training;Stair training;Patient/family education;Vestibular     PT Next Visit  Plan  review HEP, reassess PRN and write goals PRN       Patient will benefit from skilled therapeutic intervention in order to improve the following deficits and impairments:  Dizziness  Visit Diagnosis: Dizziness and giddiness - Plan: PT plan of care cert/re-cert     Problem List Patient Active Problem List   Diagnosis Date Noted  . Normocytic anemia 05/04/2018  . Vertigo 05/02/2018  . Dry skin 08/22/2017  . Right cervical radiculopathy 06/27/2016  . Ocular migraine 02/23/2016  . Benign prostate hyperplasia 12/29/2015  . Marital dysfunction 01/01/2015  . CAD (coronary artery disease) 07/22/2014  . Annual physical exam 11/07/2012  . Insomnia 11/07/2012  . Perennial allergic rhinitis 11/07/2012  . H/O gastric bypass 10/09/2012  . Erectile dysfunction associated with type 2 diabetes mellitus ([place]) 11/14/2011  . S/P stroke due to cerebrovascular disease 11/14/2011  . Diabetes mellitus type 2, controlled ([place]) 11/14/2011      Laureen Abrahams, PT, DPT 05/08/18 11:50 AM     [facility] [place], [place], [address] Phone: [phone]   Fax:  [phone]  Name: James Frye MRN: [id number] Date of Birth: [birth year]

## 2018-05-08 NOTE — Patient Instructions (Signed)
Access Code: [email or web address]  URL: [email or web address]  Date: 05/08/2018  Prepared by: Faustino Congress   Exercises  Romberg Stance with Head Nods on Foam Pad - 10 reps - 1 sets - 2x daily - 7x weekly

## 2018-06-06 ENCOUNTER — Ambulatory Visit (INDEPENDENT_AMBULATORY_CARE_PROVIDER_SITE_OTHER): Payer: Medicare HMO | Admitting: Sports Medicine

## 2018-06-06 ENCOUNTER — Encounter: Payer: Self-pay | Admitting: Sports Medicine

## 2018-06-06 VITALS — BP 119/76 | HR 83 | Ht 67.0 in | Wt 185.0 lb

## 2018-06-06 DIAGNOSIS — E119 Type 2 diabetes mellitus without complications: Secondary | ICD-10-CM | POA: Diagnosis not present

## 2018-06-06 DIAGNOSIS — D649 Anemia, unspecified: Secondary | ICD-10-CM

## 2018-06-06 DIAGNOSIS — Z0000 Encounter for general adult medical examination without abnormal findings: Secondary | ICD-10-CM

## 2018-06-06 DIAGNOSIS — Z23 Encounter for immunization: Secondary | ICD-10-CM

## 2018-06-06 DIAGNOSIS — R42 Dizziness and giddiness: Secondary | ICD-10-CM | POA: Diagnosis not present

## 2018-06-06 MED ORDER — DOCUSATE SODIUM 100 MG PO CAPS: 100.0000 mg | ORAL_CAPSULE | Freq: Three times a day (TID) | ORAL | 3 refills | Status: DC

## 2018-06-06 NOTE — Assessment & Plan Note (Signed)
Due for hemoglobin A1c.

## 2018-06-06 NOTE — Progress Notes (Signed)
Subjective:    CC: Follow-up  HPI: James Frye returns, we treated him with vestibular rehabilitation for BPPV, this has resolved.  In addition we found him to be anemic.  He has done moderately well with iron supplementation with expected constipation.  He has gotten several calls from digestive health specialists but has not yet returned the call to schedule an appointment for colonoscopy.  I reviewed the past medical history, family history, social history, surgical history, and allergies today and no changes were needed.  Please see the problem list section below in epic for further details.  Past Medical History: Past Medical History:  Diagnosis Date  . CAD (coronary artery disease)   . Diabetes mellitus   . Hiatal hernia    Past Surgical History: Past Surgical History:  Procedure Laterality Date  . APPENDECTOMY    . GASTRIC BYPASS     Social History: Social History   Socioeconomic History  . Marital status: Married    Spouse name: Not on file  . Number of children: 3  . Years of education: Not on file  . Highest education level: Not on file  Occupational History  . Not on file  Social Needs  . Financial resource strain: Not on file  . Food insecurity:    Worry: Not on file    Inability: Not on file  . Transportation needs:    Medical: Not on file    Non-medical: Not on file  Tobacco Use  . Smoking status: Former Research scientist (life sciences)  . Smokeless tobacco: Never Used  Substance and Sexual Activity  . Alcohol use: No  . Drug use: No  . Sexual activity: Yes  Lifestyle  . Physical activity:    Days per week: Not on file    Minutes per session: Not on file  . Stress: Not on file  Relationships  . Social connections:    Talks on phone: Not on file    Gets together: Not on file    Attends religious service: Not on file    Active member of club or organization: Not on file    Attends meetings of clubs or organizations: Not on file    Relationship status: Not on file  Other  Topics Concern  . Not on file  Social History Narrative  . Not on file   Family History: Family History  Problem Relation Age of Onset  . Cancer Mother   . Stroke Father   . Heart disease Father    Allergies: Allergies  Allergen Reactions  . Penicillins Rash   Medications: See med rec.  Review of Systems: No fevers, chills, night sweats, weight loss, chest pain, or shortness of breath.   Objective:    General: Well Developed, well nourished, and in no acute distress.  Neuro: Alert and oriented x3, extra-ocular muscles intact, sensation grossly intact.  HEENT: Normocephalic, atraumatic, pupils equal round reactive to light, neck supple, no masses, no lymphadenopathy, thyroid nonpalpable.  Skin: Warm and dry, no rashes. Cardiac: Regular rate and rhythm, no murmurs rubs or gallops, no lower extremity edema.  Respiratory: Clear to auscultation bilaterally. Not using accessory muscles, speaking in full sentences.  Impression and Recommendations:    Normocytic anemia Noted low ferritin suggesting iron deficiency anemia. Has been doing iron sulfate 3 times daily with the expected constipation so adding Colace. Rechecking iron indices. He has not yet called gastroenterology back, I again emphasized the importance of a GI evaluation and the concern for colon cancer.  Vertigo Resolved with  vestibular rehabilitation  Diabetes mellitus type 2, controlled Due for hemoglobin A1c.  Annual physical exam Influenza and pneumococcal 23 vaccines today  I spent 25 minutes with this patient, greater than 50% was face-to-face time counseling regarding the above diagnoses, specifically the more ominous differentials for iron deficiency anemia in a 67 year old male. ___________________________________________ [name]. Dianah Field, M.D., ABFM., CAQSM. Primary Care and Sports Medicine [facility]  Adjunct Professor of [place] of [facility] of Medicine

## 2018-06-06 NOTE — Assessment & Plan Note (Signed)
Noted low ferritin suggesting iron deficiency anemia. Has been doing iron sulfate 3 times daily with the expected constipation so adding Colace. Rechecking iron indices. He has not yet called gastroenterology back, I again emphasized the importance of a GI evaluation and the concern for colon cancer.

## 2018-06-06 NOTE — Assessment & Plan Note (Signed)
Influenza and pneumococcal 23 vaccines today

## 2018-06-06 NOTE — Assessment & Plan Note (Signed)
Resolved with vestibular rehabilitation

## 2018-06-07 LAB — HEMOGLOBIN A1C
Hgb A1c MFr Bld: 6 %{Hb} — ABNORMAL HIGH (ref <5.7)
Mean Plasma Glucose: 126 (calc)
eAG (mmol/L): 7 (calc)

## 2018-06-07 LAB — ANEMIA PROFILE B
%SAT: 27 % (ref 20–48)
ABS Retic: 58500 {cells}/uL (ref 25000–9000)
Basophils Absolute: 40 cells/uL (ref 0–200)
Basophils Relative: 0.8 %
Eosinophils Absolute: 180 cells/uL (ref 15–500)
Eosinophils Relative: 3.6 %
Ferritin: 18 ng/mL — ABNORMAL LOW (ref 24–380)
Folate: 11.9 ng/mL
HCT: 39.7 % (ref 38.5–50.0)
Hemoglobin: 13.2 g/dL (ref 13.2–17.1)
Iron: 105 ug/dL (ref 50–180)
Lymphs Abs: 1170 cells/uL (ref 850–3900)
MCH: 29.3 pg (ref 27.0–33.0)
MCHC: 33.2 g/dL (ref 32.0–36.0)
MCV: 88.2 fL (ref 80.0–100.0)
MPV: 10.4 fL (ref 7.5–12.5)
Monocytes Relative: 8.3 %
Neutro Abs: 3195 cells/uL (ref 1500–7800)
Neutrophils Relative %: 63.9 %
Platelets: 234 Thousand/uL (ref 140–400)
RBC: 4.5 Million/uL (ref 4.20–5.80)
RDW: 13.1 % (ref 11.0–15.0)
Retic Ct Pct: 1.3 %
TIBC: 384 mcg/dL (calc) (ref 250–425)
Total Lymphocyte: 23.4 %
Vitamin B-12: 1254 pg/mL — ABNORMAL HIGH (ref 200–1100)
WBC mixed population: 415 cells/uL (ref 200–950)
WBC: 5 10*3/uL (ref 3.8–10.8)

## 2018-06-08 ENCOUNTER — Encounter: Payer: Self-pay | Admitting: Sports Medicine

## 2018-07-20 ENCOUNTER — Other Ambulatory Visit: Payer: Self-pay | Admitting: Sports Medicine

## 2018-08-21 LAB — HM COLONOSCOPY

## 2018-09-06 ENCOUNTER — Other Ambulatory Visit: Payer: Self-pay | Admitting: Sports Medicine

## 2018-09-06 DIAGNOSIS — G4700 Insomnia, unspecified: Secondary | ICD-10-CM

## 2018-10-06 ENCOUNTER — Other Ambulatory Visit: Payer: Self-pay | Admitting: Sports Medicine

## 2018-10-06 DIAGNOSIS — G4700 Insomnia, unspecified: Secondary | ICD-10-CM

## 2018-10-22 ENCOUNTER — Ambulatory Visit (INDEPENDENT_AMBULATORY_CARE_PROVIDER_SITE_OTHER): Payer: Medicare HMO | Admitting: Sports Medicine

## 2018-10-22 ENCOUNTER — Encounter: Payer: Self-pay | Admitting: Sports Medicine

## 2018-10-22 DIAGNOSIS — M720 Palmar fascial fibromatosis [Dupuytren]: Secondary | ICD-10-CM

## 2018-10-22 DIAGNOSIS — R42 Dizziness and giddiness: Secondary | ICD-10-CM

## 2018-10-22 NOTE — Progress Notes (Signed)
Subjective:    CC: Vertigo  HPI: This is a pleasant 68 year old male, recently he had an episode of feeling the room spinning, this occurred sitting at his desk, he looked up.  No headaches, no obvious visual changes, no tinnitus, no upper respiratory symptoms.  No trauma.  No focal neurologic symptoms.  Symptoms resolved after about 30 to 40 minutes.  He has had an episode of this before about 5 months ago that resolved vestibular rehabilitation.  Symptoms are mild, improved.  He also has noted a nodularity on his palm.  No change in function.  I reviewed the past medical history, family history, social history, surgical history, and allergies today and no changes were needed.  Please see the problem list section below in epic for further details.  Past Medical History: Past Medical History:  Diagnosis Date  . CAD (coronary artery disease)   . Diabetes mellitus   . Hiatal hernia    Past Surgical History: Past Surgical History:  Procedure Laterality Date  . APPENDECTOMY    . GASTRIC BYPASS     Social History: Social History   Socioeconomic History  . Marital status: Married    Spouse name: Not on file  . Number of children: 3  . Years of education: Not on file  . Highest education level: Not on file  Occupational History  . Not on file  Social Needs  . Financial resource strain: Not on file  . Food insecurity:    Worry: Not on file    Inability: Not on file  . Transportation needs:    Medical: Not on file    Non-medical: Not on file  Tobacco Use  . Smoking status: Former Research scientist (life sciences)  . Smokeless tobacco: Never Used  Substance and Sexual Activity  . Alcohol use: No  . Drug use: No  . Sexual activity: Yes  Lifestyle  . Physical activity:    Days per week: Not on file    Minutes per session: Not on file  . Stress: Not on file  Relationships  . Social connections:    Talks on phone: Not on file    Gets together: Not on file    Attends religious service: Not on file     Active member of club or organization: Not on file    Attends meetings of clubs or organizations: Not on file    Relationship status: Not on file  Other Topics Concern  . Not on file  Social History Narrative  . Not on file   Family History: Family History  Problem Relation Age of Onset  . Cancer Mother   . Stroke Father   . Heart disease Father    Allergies: Allergies  Allergen Reactions  . Penicillins Rash   Medications: See med rec.  Review of Systems: No fevers, chills, night sweats, weight loss, chest pain, or shortness of breath.   Objective:    General: Well Developed, well nourished, and in no acute distress.  Neuro: Alert and oriented x3, extra-ocular muscles intact, sensation grossly intact.  HEENT: Normocephalic, atraumatic, pupils equal round reactive to light, neck supple, no masses, no lymphadenopathy, thyroid nonpalpable.  Negative Dix-Hallpike sign. Skin: Warm and dry, no rashes. Cardiac: Regular rate and rhythm, no murmurs rubs or gallops, no lower extremity edema.  Respiratory: Clear to auscultation bilaterally. Not using accessory muscles, speaking in full sentences. Hand: Palpable subcutaneous nodules consistent with Dupuytren's contracture.  Impression and Recommendations:    Vertigo Single recurrence of symptoms, I would  like to refer him back to vestibular rehabilitation.  Dupuytrens contracture Asymptomatic. Able to put his hand flat on the table.   ___________________________________________ [name]. Dianah Field, M.D., ABFM., CAQSM. Primary Care and Sports Medicine [facility]  Adjunct Professor of [place] of [facility] [employer]

## 2018-10-22 NOTE — Assessment & Plan Note (Signed)
Asymptomatic. Able to put his hand flat on the table.

## 2018-10-22 NOTE — Patient Instructions (Signed)
Dupuytren's Contracture  Dupuytren's contracture is a condition in which tissue under the skin of the palm becomes thick. This causes one or more of the fingers to curl inward (contract) toward the palm. After a while, the fingers may not be able to straighten out. This condition affects some or all of the fingers and the palm of the hand. This condition may affect one or both hands.  Dupuytren's contracture is a long-term (chronic) condition that develops (progresses) slowly over time. There is no cure, but symptoms can be managed and progression can be slowed with treatment. This condition is usually not dangerous or painful, but it can interfere with everyday tasks.  What are the causes?    This condition is caused by tissue (fascia) in the palm that gets thicker and tighter. When the fascia thickens, it pulls on the cords of tissue (tendons) that control finger movement. This causes the fingers to contract.  The cause of fascia thickening is not known. However, the condition is often passed along from parent to child (inherited).  What increases the risk?  The following factors may make you more likely to develop this condition:  Being 66 years of age or older.  Being male.  Having a family history of this condition.  Using tobacco products, including cigarettes, chewing tobacco, and e-cigarettes.  Drinking alcohol excessively.  Having diabetes.  Having a seizure disorder.  What are the signs or symptoms?    Early symptoms of this condition may include:  Thick, puckered skin on the hand.  One or more lumps (nodules) on the palm. Nodules may be tender when they first appear, but they are generally painless.  Later symptoms of this condition may include:  Thick cords of tissue in the palm.  Fingers curled up toward the palm.  Inability to straighten the fingers into their normal position.  Though this condition is usually painless, you may have discomfort when holding or grabbing objects.  How is this  diagnosed?  This condition is diagnosed with a physical exam, which may include:  Looking at your hands and feeling your palms. This is to check for thickened fascia and nodules.  Measuring finger motion.  Doing the [place] tabletop test. You may be asked to try to put your hand on a surface, with your palm down and your fingers straight out.  How is this treated?  There is no cure for this condition, but treatment can relieve discomfort and make symptoms more manageable. Treatment options may include:  Physical therapy. This can strengthen your hand and increase flexibility.  Occupational therapy. This can help you with everyday tasks that may be more difficult because of your condition.  Shots (injections). Substances may be injected into your hand, such as:  Medicines that help to decrease swelling (corticosteroids).  Proteins (collagenase) to weaken thick tissue. After a collagenase injection, your health care provider may stretch your fingers.  Needle aponeurotomy. A needle is pushed through the skin and into the fascia. Moving the needle against the fascia can weaken or break up the thick tissue.  Surgery. This may be needed if your condition causes discomfort or interferes with everyday activities. Physical therapy is usually needed after surgery.  No treatment is guaranteed to cure this condition. Recurrence of symptoms is common.  Follow these instructions at home:  Hand care  Take these actions to help protect your hand from possible injury:  Use tools that have padded grips.  Wear protective gloves while you work with your   hands.  Avoid repetitive hand movements.  General instructions  Take over-the-counter and prescription medicines only as told by your health care provider.  Manage any other conditions that you have, such as diabetes.  If physical therapy was prescribed, do exercises as told by your health care provider.  Do not use any products that contain nicotine or tobacco, such as cigarettes,  e-cigarettes, and chewing tobacco. If you need help quitting, ask your health care provider.  If you drink alcohol:  Limit how much you use to:  0-1 drink a day for women.  0-2 drinks a day for men.  Be aware of how much alcohol is in your drink. In the [place] one drink equals one 12 oz bottle of beer (355 mL), one 5 oz glass of wine (148 mL), or one 1 oz glass of hard liquor (44 mL).  Keep all follow-up visits as told by your health care provider. This is important.  Contact a health care provider if:  You develop new symptoms, or your symptoms get worse.  You have pain that gets worse or does not get better with medicine.  You have difficulty or discomfort with everyday tasks.  You develop numbness or tingling.  Get help right away if:  You have severe pain.  Your fingers change color or become unusually cold.  Summary  Dupuytren's contracture is a condition in which tissue under the skin of the palm becomes thick.  This condition is caused by tissue (fascia) that thickens. When it thickens, it pulls on the cords of tissue (tendons) that control finger movement and makes the fingers to contract.  You are more likely to develop this condition if you are a man, are over 66 years of age, have a family history of the condition, and drink a lot of alcohol.  This condition can be treated with physical and occupational therapy, injections, and surgery.  Follow instructions about how to care for your hand. Get help right away if you have severe pain or your fingers change color or become cold.  This information is not intended to replace advice given to you by your health care provider. Make sure you discuss any questions you have with your health care provider.  Document Released: 05/28/2009 Document Revised: 02/19/2018 Document Reviewed: 02/19/2018  Elsevier Interactive Patient Education  2019 Elsevier Inc.

## 2018-10-22 NOTE — Assessment & Plan Note (Signed)
Single recurrence of symptoms, I would like to refer him back to vestibular rehabilitation.

## 2018-10-23 ENCOUNTER — Ambulatory Visit: Payer: Medicare HMO | Admitting: Physical Therapy

## 2018-10-23 ENCOUNTER — Other Ambulatory Visit: Payer: Self-pay

## 2018-10-23 ENCOUNTER — Encounter: Payer: Self-pay | Admitting: Physical Therapy

## 2018-10-23 VITALS — BP 134/80

## 2018-10-23 DIAGNOSIS — R42 Dizziness and giddiness: Secondary | ICD-10-CM | POA: Diagnosis not present

## 2018-10-23 NOTE — Patient Instructions (Signed)
Access Code: [email or web address]  URL: [email or web address]  Date: 10/23/2018  Prepared by: Faustino Congress   Exercises  Standing Gaze Stabilization with Head Rotation - 1 reps - 1 sets - 30 Seconds - 2x daily - 7x weekly  Standing Gaze Stabilization with Head Nod - 1 reps - 3 sets - 30 seconds - 2x daily - 7x weekly  Patient Education  Gaze Stabilization

## 2018-10-24 NOTE — Therapy (Signed)
[place], [place], [address] Phone: [phone]   Fax:  [phone]  Physical Therapy Evaluation  Patient Details  Name: James Frye MRN: [id number] Date of Birth: 1951/02/12 Referring Provider (PT): Silverio Decamp, MD   Encounter Date: 10/23/2018  PT End of Session - 10/24/18 1617    Visit Number  1    Number of Visits  6    Date for PT Re-Evaluation  12/05/18    Authorization Type  Medicare/Cigna    PT Start Time  1517    PT Stop Time  1601    PT Time Calculation (min)  44 min    Activity Tolerance  Patient tolerated treatment well    Behavior During Therapy  [facility] for tasks assessed/performed       Past Medical History:  Diagnosis Date  . CAD (coronary artery disease)   . Diabetes mellitus   . Hiatal hernia     Past Surgical History:  Procedure Laterality Date  . APPENDECTOMY    . GASTRIC BYPASS      Vitals:   10/23/18 1546  BP: 134/80     Subjective Assessment - 10/23/18 1521    Subjective  Pt is a 68 y/o male who returns to OPPT for return of vertigo just 2 days prior.  Pt reports he was sitting at computer and developed sudden onset of vertigo (described as spinning) then fell upon trying to get up from chair.  Pt states symptoms lasted 20-30 min with improvment in symptoms.  Pt then tried to go to work, but unable to complete shift due to symptoms.  Presents today with no symptoms.    Pertinent History  gastric bypass, DM, CAD    Patient Stated Goals  treat vertigo    Currently in Pain?  No/denies         [facility] PT Assessment - 10/23/18 1524      Assessment   Medical Diagnosis  vertigo    Referring Provider (PT)  Silverio Decamp, MD    Onset Date/Surgical Date  10/21/18    Next MD Visit  11/19/2018    Prior Therapy  eval only in Sept for vertigo      Precautions   Precautions  None      Restrictions   Weight Bearing Restrictions  No      Balance  Screen   Has the patient fallen in the past 6 months  Yes    How many times?  1    Has the patient had a decrease in activity level because of a fear of falling?   No    Is the patient reluctant to leave their home because of a fear of falling?   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; drives forklift with [employer]    Leisure  working; no regular exercise      Cognition   Overall Cognitive Status  Within Functional Limits for tasks assessed           Vestibular Assessment - 10/23/18 1526      Vestibular Assessment   General Observation  no symptoms at rest  Symptom Behavior   Type of Dizziness   Spinning;Imbalance    Frequency of Dizziness  isolated episode on Monday    Duration of Dizziness  20-30 min    Symptom Nature  Spontaneous    Aggravating Factors  Spontaneous onset;No known aggravating factors    Relieving Factors  No known relieving factors    Progression of Symptoms  Better    History of similar episodes  similar episode in Sept 2019      Oculomotor Exam   Oculomotor Alignment  Normal    Spontaneous  Absent    Gaze-induced  Absent    Smooth Pursuits  Intact    Saccades  Intact      Oculomotor Exam-Fixation Suppressed    Left Head Impulse  negative    Right Head Impulse  negative      Vestibulo-Ocular Reflex   VOR 1 Head Only (x 1 viewing)  WNL      Other Tests   Comments  seated cervical rotation test negative for cervicogenic dizziness      Positional Testing   Dix-Hallpike  Dix-Hallpike Right;Dix-Hallpike Left    Sidelying Test  Sidelying Right;Sidelying Left    Horizontal Canal Testing  Horizontal Canal Right;Horizontal Canal Left       Dix-Hallpike Right   Dix-Hallpike Right Duration  none    Dix-Hallpike Right Symptoms  No nystagmus      Dix-Hallpike Left   Dix-Hallpike Left Duration  none    Dix-Hallpike Left Symptoms  No nystagmus      Sidelying Right   Sidelying Right Duration  none    Sidelying Right Symptoms  No nystagmus      Sidelying Left   Sidelying Left Duration  none    Sidelying Left Symptoms  No nystagmus      Horizontal Canal Right   Horizontal Canal Right Duration  none    Horizontal Canal Right Symptoms  Normal      Horizontal Canal Left   Horizontal Canal Left Duration  none    Horizontal Canal Left Symptoms  Normal          Objective measurements completed on examination: See above findings.       Vestibular Treatment/Exercise - 10/24/18 0001      Vestibular Treatment/Exercise   Vestibular Treatment Provided  Gaze    Gaze Exercises  X1 Viewing Horizontal;X1 Viewing Vertical      X1 Viewing Horizontal   Foot Position  feet apart    Time  --   30 sec   Reps  1      X1 Viewing Vertical   Foot Position  feet apart    Time  --   30 sec   Reps  1            PT Education - 10/24/18 1616    Education Details  HEP, clinical findings    Person(s) Educated  Patient    Methods  Explanation;Demonstration;Handout    Comprehension  Verbalized understanding;Returned demonstration;Need further instruction          PT Long Term Goals - 10/24/18 1621      PT LONG TERM GOAL #1   Title  to be determined if pt returns             Plan - 10/24/18 1617    Clinical Impression Statement  Pt is a 68 y/o male who presents to OPPT for sudden onset of vertigo 3 days prior, which has since resolved.  Pt  with remote hx of vertigo ~5 months ago with similar reports of spontaneous onset and resolve.  Unable to provoke symptoms in clinic today and pt asymptomatic today.  Pt's subjective is not consistent for BPPV, and concern for TIA given prior medical history.  Pt isn't  consistently monitoring glucose or BP and advised to begin checking to see if he notices any elevated trends.  Other differentials include Meniere's however pt denies tinnitus or hearing loss and cervicogenic dizziness ruled out today.  Pt provided with gaze stabilization to help with vestibular input for balance, and recommended he call if symptoms return.    Personal Factors and Comorbidities  Comorbidity 3+;Past/Current Experience    Comorbidities  gastric bypass, HTN, DM, hx CVA    Examination-Activity Limitations  Locomotion Level    Examination-Participation Restrictions  Driving;Other   work   Merchant navy officer  Evolving/Moderate complexity    Clinical Decision Making  Moderate    Rehab Potential  Good    PT Frequency  1x / week   PRN up to 1x/wk x 6 weeks   PT Duration  6 weeks    PT Treatment/Interventions  ADLs/Self Care Home Management;Canalith Repostioning;Cryotherapy;Moist Heat;Balance training;Therapeutic exercise;Therapeutic activities;Functional mobility training;Neuromuscular re-education;Gait training;Stair training;Patient/family education;Vestibular    PT Next Visit Plan  review HEP, reassess PRN and write goals PRN    PT Home Exercise Plan  Access Code: [email or web address]    Consulted and Agree with Plan of Care  Patient       Patient will benefit from skilled therapeutic intervention in order to improve the following deficits and impairments:  Dizziness  Visit Diagnosis: Dizziness and giddiness - Plan: PT plan of care cert/re-cert     Problem List Patient Active Problem List   Diagnosis Date Noted  . Dupuytrens contracture 10/22/2018  . Normocytic anemia 05/04/2018  . Vertigo 05/02/2018  . Dry skin 08/22/2017  . Right cervical radiculopathy 06/27/2016  . Ocular migraine 02/23/2016  . Benign prostate hyperplasia 12/29/2015  . Marital dysfunction 01/01/2015  . CAD (coronary artery disease) 07/22/2014  . Annual physical exam 11/07/2012  . Insomnia  11/07/2012  . Perennial allergic rhinitis 11/07/2012  . H/O gastric bypass 10/09/2012  . Erectile dysfunction associated with type 2 diabetes mellitus ([place]) 11/14/2011  . S/P stroke due to cerebrovascular disease 11/14/2011  . Diabetes mellitus type 2, controlled ([place]) 11/14/2011      Laureen Abrahams, PT, DPT 10/24/18 4:23 PM     [facility] [place], [place], [address] Phone: [phone]   Fax:  [phone]  Name: FAHIM KATS MRN: [id number] Date of Birth: [birth year]

## 2018-11-09 ENCOUNTER — Other Ambulatory Visit: Payer: Self-pay | Admitting: Sports Medicine

## 2018-11-09 DIAGNOSIS — G4700 Insomnia, unspecified: Secondary | ICD-10-CM

## 2018-11-19 ENCOUNTER — Encounter: Payer: Self-pay | Admitting: Sports Medicine

## 2018-11-19 ENCOUNTER — Telehealth (INDEPENDENT_AMBULATORY_CARE_PROVIDER_SITE_OTHER): Payer: Medicare HMO | Admitting: Sports Medicine

## 2018-11-19 ENCOUNTER — Other Ambulatory Visit: Payer: Self-pay

## 2018-11-19 DIAGNOSIS — R42 Dizziness and giddiness: Secondary | ICD-10-CM

## 2018-11-19 NOTE — Progress Notes (Signed)
Virtual Visit via WebEx/MyChart   I connected with  James Frye  on 11/19/18 via WebEx/MyChart and verified that I am speaking with the correct person using two identifiers.   I discussed the limitations, risks, security and privacy concerns of performing an evaluation and management service by WebEx/MyChart , including the higher likelihood of inaccurate diagnosis and treatment, and the availability of in person appointments.  We also discussed the likely need of an additional face to face encounter for complete and high quality delivery of care.  I also discussed with the patient that there may be a patient responsible charge related to this service. The patient expressed understanding and wishes to proceed.  Subjective:    CC: Follow-up  HPI: BPPV: Symptoms resolved with vestibular rehabilitation.  I reviewed the past medical history, family history, social history, surgical history, and allergies today and no changes were needed.  Please see the problem list section below in epic for further details.  Past Medical History: Past Medical History:  Diagnosis Date  . CAD (coronary artery disease)   . Diabetes mellitus   . Hiatal hernia    Past Surgical History: Past Surgical History:  Procedure Laterality Date  . APPENDECTOMY    . GASTRIC BYPASS     Social History: Social History   Socioeconomic History  . Marital status: Married    Spouse name: Not on file  . Number of children: 3  . Years of education: Not on file  . Highest education level: Not on file  Occupational History  . Not on file  Social Needs  . Financial resource strain: Not on file  . Food insecurity:    Worry: Not on file    Inability: Not on file  . Transportation needs:    Medical: Not on file    Non-medical: Not on file  Tobacco Use  . Smoking status: Former Research scientist (life sciences)  . Smokeless tobacco: Never Used  Substance and Sexual Activity  . Alcohol use: No  . Drug use: No  . Sexual activity: Yes   Lifestyle  . Physical activity:    Days per week: Not on file    Minutes per session: Not on file  . Stress: Not on file  Relationships  . Social connections:    Talks on phone: Not on file    Gets together: Not on file    Attends religious service: Not on file    Active member of club or organization: Not on file    Attends meetings of clubs or organizations: Not on file    Relationship status: Not on file  Other Topics Concern  . Not on file  Social History Narrative  . Not on file   Family History: Family History  Problem Relation Age of Onset  . Cancer Mother   . Stroke Father   . Heart disease Father    Allergies: Allergies  Allergen Reactions  . Penicillins Rash   Medications: See med rec.  Review of Systems: No fevers, chills, night sweats, weight loss, chest pain, or shortness of breath.   Objective:    General: Speaking full sentences, no audible heavy breathing.  Sounds alert and appropriately interactive.  Appears well.  Face symmetric.  Extraocular movements intact.  Pupils equal and round.  No nasal flaring or accessory muscle use visualized.  No other physical exam performed due to the non-physical nature of this visit.  Impression and Recommendations:    Vertigo Benign paroxysmal positional vertigo, symptoms now resolved  with vestibular rehab and continuously doing the Epley maneuver at home. He is happy with how things are going, he can do the Epley maneuver in the future should symptoms come back, and I am happy to send in prednisone, meclizine, Valium should he have intractable symptoms.  I discussed the above assessment and treatment plan with the patient. The patient was provided an opportunity to ask questions and all were answered. The patient agreed with the plan and demonstrated an understanding of the instructions.   The patient was advised to call back or seek an in-person evaluation if the symptoms worsen or if the condition fails to improve  as anticipated.   I provided 21 minutes of electronic video evaluation time during this encounter, less than 50% was time needed to gather information, review chart and records, explain the treatment plan to the patient, and complete documentation.   ___________________________________________ [name]. Dianah Field, M.D., ABFM., CAQSM. Primary Care and Sports Medicine [facility]  Adjunct Professor of [place] [employer] [facility]

## 2018-11-19 NOTE — Assessment & Plan Note (Signed)
Benign paroxysmal positional vertigo, symptoms now resolved with vestibular rehab and continuously doing the Epley maneuver at home. He is happy with how things are going, he can do the Epley maneuver in the future should symptoms come back, and I am happy to send in prednisone, meclizine, Valium should he have intractable symptoms.

## 2018-12-11 ENCOUNTER — Other Ambulatory Visit: Payer: Self-pay | Admitting: Sports Medicine

## 2018-12-11 DIAGNOSIS — G4700 Insomnia, unspecified: Secondary | ICD-10-CM

## 2018-12-29 ENCOUNTER — Other Ambulatory Visit: Payer: Self-pay | Admitting: Sports Medicine

## 2019-01-08 ENCOUNTER — Other Ambulatory Visit: Payer: Self-pay | Admitting: Sports Medicine

## 2019-01-08 DIAGNOSIS — G4700 Insomnia, unspecified: Secondary | ICD-10-CM

## 2019-01-30 ENCOUNTER — Encounter: Payer: Self-pay | Admitting: Sports Medicine

## 2019-01-30 ENCOUNTER — Other Ambulatory Visit: Payer: Self-pay

## 2019-01-30 ENCOUNTER — Ambulatory Visit (INDEPENDENT_AMBULATORY_CARE_PROVIDER_SITE_OTHER): Payer: Medicare HMO | Admitting: Sports Medicine

## 2019-01-30 VITALS — BP 118/78 | HR 78 | Ht 67.0 in | Wt 190.0 lb

## 2019-01-30 DIAGNOSIS — E1169 Type 2 diabetes mellitus with other specified complication: Secondary | ICD-10-CM

## 2019-01-30 DIAGNOSIS — N521 Erectile dysfunction due to diseases classified elsewhere: Secondary | ICD-10-CM

## 2019-01-30 DIAGNOSIS — Z0000 Encounter for general adult medical examination without abnormal findings: Secondary | ICD-10-CM

## 2019-01-30 DIAGNOSIS — E119 Type 2 diabetes mellitus without complications: Secondary | ICD-10-CM | POA: Diagnosis not present

## 2019-01-30 DIAGNOSIS — I2510 Atherosclerotic heart disease of native coronary artery without angina pectoris: Secondary | ICD-10-CM | POA: Diagnosis not present

## 2019-01-30 DIAGNOSIS — N401 Enlarged prostate with lower urinary tract symptoms: Secondary | ICD-10-CM | POA: Diagnosis not present

## 2019-01-30 DIAGNOSIS — I2583 Coronary atherosclerosis due to lipid rich plaque: Secondary | ICD-10-CM

## 2019-01-30 DIAGNOSIS — F5101 Primary insomnia: Secondary | ICD-10-CM

## 2019-01-30 MED ORDER — HYDROXYZINE HCL 25 MG PO TABS: 25.0000 mg | ORAL_TABLET | Freq: Every day | ORAL | 3 refills | Status: DC

## 2019-01-30 MED ORDER — SHINGRIX 50 MCG/0.5ML IM SUSR: 0.5000 mL | Freq: Once | INTRAMUSCULAR | 0 refills | Status: AC

## 2019-01-30 NOTE — Assessment & Plan Note (Addendum)
Medicare physical as above, checking routine labs. Prescription sent to pharmacy for Shingrix vaccine

## 2019-01-30 NOTE — Assessment & Plan Note (Signed)
Scheduled for eye exam. Foot exam was done today.

## 2019-01-30 NOTE — Assessment & Plan Note (Signed)
Refilling hydroxyzine. Seems to be working well.

## 2019-01-30 NOTE — Progress Notes (Signed)
Subjective:   James Frye is a 68 y.o. male who presents for Medicare Annual/Subsequent preventive examination.  Review of Systems:  Comprehensive review of systems was negative    Objective:    Vitals: BP 118/78   Pulse 78   Ht 5\' 7"  (1.702 m)   Wt 190 lb (86.2 kg)   BMI 29.76 kg/m   Body mass index is 29.76 kg/m.  Advanced Directives 10/23/2018 05/08/2018 05/17/2016 12/24/2013  Does Patient Have a Medical Advance Directive? No No No Patient does not have advance directive;Patient would like information  Would patient like information on creating a medical advance directive? No - Patient declined No - Patient declined No - patient declined information -    Tobacco Social History   Tobacco Use  Smoking Status Former Smoker  Smokeless Tobacco Never Used     Counseling given: Not Answered   Past Medical History:  Diagnosis Date  . CAD (coronary artery disease)   . Diabetes mellitus   . Hiatal hernia    Past Surgical History:  Procedure Laterality Date  . APPENDECTOMY    . GASTRIC BYPASS     Family History  Problem Relation Age of Onset  . Cancer Mother   . Stroke Father   . Heart disease Father    Social History   Socioeconomic History  . Marital status: Married    Spouse name: Not on file  . Number of children: 3  . Years of education: Not on file  . Highest education level: Not on file  Occupational History  . Not on file  Social Needs  . Financial resource strain: Not on file  . Food insecurity    Worry: Not on file    Inability: Not on file  . Transportation needs    Medical: Not on file    Non-medical: Not on file  Tobacco Use  . Smoking status: Former Research scientist (life sciences)  . Smokeless tobacco: Never Used  Substance and Sexual Activity  . Alcohol use: No  . Drug use: No  . Sexual activity: Yes  Lifestyle  . Physical activity    Days per week: Not on file    Minutes per session: Not on file  . Stress: Not on file  Relationships  . Social  Herbalist on phone: Not on file    Gets together: Not on file    Attends religious service: Not on file    Active member of club or organization: Not on file    Attends meetings of clubs or organizations: Not on file    Relationship status: Not on file  Other Topics Concern  . Not on file  Social History Narrative  . Not on file    Outpatient Encounter Medications as of 01/30/2019  Medication Sig  . AMBULATORY NON FORMULARY MEDICATION Single glucometer with lancets, test strips  . docusate sodium (COLACE) 100 MG capsule Take 1 capsule (100 mg total) by mouth 3 (three) times daily. With ferrous sulfate  . ferrous sulfate 325 (65 FE) MG EC tablet Take 1 tablet (325 mg total) by mouth 3 (three) times daily with meals. (Patient not taking: Reported on 01/30/2019)  . fluticasone (FLONASE) 50 MCG/ACT nasal spray USE 1 SPRAY(S) IN EACH NOSTRIL TWICE DAILY. USE LEFT HAND FOR RIGHT NOSTRIL, AND RIGHT HAND FOR LEFT NOSTRIL  . hydrOXYzine (ATARAX/VISTARIL) 25 MG tablet Take 1 tablet (25 mg total) by mouth at bedtime.  . metFORMIN (GLUCOPHAGE) 1000 MG tablet TAKE  1 TABLET BY MOUTH ONCE DAILY WITH BREAKFAST  . tadalafil (CIALIS) 20 MG tablet Take 1 tablet (20 mg total) daily as needed by mouth for erectile dysfunction. (Patient not taking: Reported on 10/23/2018)  . Zoster Vaccine Adjuvanted [facility]) injection Inject 0.5 mLs into the muscle once for 1 dose. Administer at 0 and 2-6 months for 2 total doses.  To be administered by pharmacy.  . [DISCONTINUED] hydrOXYzine (ATARAX/VISTARIL) 25 MG tablet TAKE 1 TABLET BY MOUTH AT BEDTIME   No facility-administered encounter medications on file as of 01/30/2019.     Activities of Daily Living No flowsheet data found.  Patient Care Team: Silverio Decamp, MD as PCP - General (Family Medicine)   Assessment:   This is a routine wellness examination for James Frye.     Goals   None     Fall Risk Fall Risk  06/18/2017  Falls in the  past year? No   Is the patient's home free of loose throw rugs in walkways, pet beds, electrical cords, etc?   yes      Grab bars in the bathroom? no      Handrails on the stairs?   yes      Adequate lighting?   yes  Timed Get Up and Go Performed: Not needed  Depression Screen PHQ 2/9 Scores 06/18/2017  PHQ - 2 Score 0    Cognitive Function        Immunization History  Administered Date(s) Administered  . Influenza Split 06/20/2012  . Influenza, High Dose Seasonal PF 06/06/2018  . Influenza,inj,Quad PF,6+ Mos 07/03/2013, 06/18/2017  . Pneumococcal Polysaccharide-23 11/07/2012, 06/06/2018  . Tdap 08/14/2010, 02/23/2014  . Zoster 11/07/2012    Qualifies for Shingles Vaccine? Yes  Screening Tests Health Maintenance  Topic Date Due  . OPHTHALMOLOGY EXAM  04/14/2018  . FOOT EXAM  06/18/2018  . URINE MICROALBUMIN  08/22/2018  . HEMOGLOBIN A1C  12/06/2018  . INFLUENZA VACCINE  03/15/2019  . PNA vac Low Risk Adult (2 of 2 - PCV13) 06/07/2019  . COLONOSCOPY  10/13/2019  . TETANUS/TDAP  02/24/2024  . Hepatitis C Screening  Completed   Cancer Screenings: Lung: Low Dose CT Chest recommended if Age 68-80 years, 30 pack-year currently smoking OR have quit w/in 15years. Patient does not qualify. Colorectal: Done  Additional Screenings: None Hepatitis C Screening:      Plan:   Annual physical exam Medicare physical as above, checking routine labs. Prescription sent to pharmacy for Shingrix vaccine  Diabetes mellitus type 2, controlled Scheduled for eye exam. Foot exam was done today.   Insomnia Refilling hydroxyzine. Seems to be working well.  Erectile dysfunction associated with type 2 diabetes mellitus Failed all of the 5 phosphodiesterase inhibitors, intracavernosal prostaglandin injections, compressive rings and pumps, at this point he is considering implantable prosthesis.  I have personally reviewed and noted the following in the patient's chart:   .  Medical and social history . Use of alcohol, tobacco or illicit drugs  . Current medications and supplements . Functional ability and status . Nutritional status . Physical activity . Advanced directives . List of other physicians . Hospitalizations, surgeries, and ER visits in previous 12 months . Vitals . Screenings to include cognitive, depression, and falls . Referrals and appointments  In addition, I have reviewed and discussed with patient certain preventive protocols, quality metrics, and best practice recommendations. A written personalized care plan for preventive services as well as general preventive health recommendations were provided to patient.  [name], MD  01/30/2019

## 2019-01-30 NOTE — Assessment & Plan Note (Signed)
Failed all of the 5 phosphodiesterase inhibitors, intracavernosal prostaglandin injections, compressive rings and pumps, at this point he is considering implantable prosthesis.

## 2019-02-03 LAB — COMPLETE METABOLIC PANEL WITH GFR
AG Ratio: 1.5 (calc) (ref 1.0–2.5)
ALT: 10 U/L (ref 9–46)
AST: 17 U/L (ref 10–35)
Albumin: 4.4 g/dL (ref 3.6–5.1)
Alkaline phosphatase (APISO): 60 U/L (ref 35–144)
BUN/Creatinine Ratio: 13 (calc) (ref 6–22)
BUN: 19 mg/dL (ref 7–25)
CO2: 30 mmol/L (ref 20–32)
Calcium: 9.7 mg/dL (ref 8.6–10.3)
Chloride: 100 mmol/L (ref 98–110)
Creat: 1.49 mg/dL — ABNORMAL HIGH (ref 0.70–1.25)
GFR, Est African American: 56 mL/min/{1.73_m2} — ABNORMAL LOW (ref >=60)
GFR, Est Non African American: 48 mL/min/{1.73_m2} — ABNORMAL LOW (ref >=60)
Globulin: 3 g/dL (calc) (ref 1.9–3.7)
Glucose, Bld: 99 mg/dL (ref 65–99)
Potassium: 5 mmol/L (ref 3.5–5.3)
Sodium: 138 mmol/L (ref 135–146)
Total Bilirubin: 0.7 mg/dL (ref 0.2–1.2)
Total Protein: 7.4 g/dL (ref 6.1–8.1)

## 2019-02-03 LAB — HEMOGLOBIN A1C
Hgb A1c MFr Bld: 5.9 % of total Hgb — ABNORMAL HIGH (ref <5.7)
Mean Plasma Glucose: 123 (calc)
eAG (mmol/L): 6.8 (calc)

## 2019-02-03 LAB — CBC
HCT: 39.6 % (ref 38.5–50.0)
Hemoglobin: 13 g/dL — ABNORMAL LOW (ref 13.2–17.1)
MCH: 29.8 pg (ref 27.0–33.0)
MCHC: 32.8 g/dL (ref 32.0–36.0)
MCV: 90.8 fL (ref 80.0–100.0)
MPV: 10.3 fL (ref 7.5–12.5)
Platelets: 237 10*3/uL (ref 140–400)
RBC: 4.36 10*6/uL (ref 4.20–5.80)
RDW: 13.4 % (ref 11.0–15.0)
WBC: 5 10*3/uL (ref 3.8–10.8)

## 2019-02-03 LAB — LIPID PANEL W/REFLEX DIRECT LDL
Cholesterol: 202 mg/dL — ABNORMAL HIGH (ref <200)
HDL: 94 mg/dL (ref >=40)
LDL Cholesterol (Calc): 85 mg/dL (calc)
Non-HDL Cholesterol (Calc): 108 mg/dL (calc) (ref <130)
Total CHOL/HDL Ratio: 2.1 (calc) (ref <5.0)
Triglycerides: 126 mg/dL (ref <150)

## 2019-02-03 LAB — MICROALBUMIN / CREATININE URINE RATIO
Creatinine, Urine: 170 mg/dL (ref 20–320)
Microalb Creat Ratio: 12 mcg/mg creat (ref <30)
Microalb, Ur: 2.1 mg/dL

## 2019-02-03 LAB — TESTOSTERONE, FREE & TOTAL
Free Testosterone: 63.7 pg/mL (ref 35.0–155.0)
Testosterone, Total, LC-MS-MS: 607 ng/dL (ref 250–1100)

## 2019-02-03 LAB — TSH: TSH: 1.19 mIU/L (ref 0.40–4.50)

## 2019-02-03 LAB — PSA, TOTAL AND FREE
PSA, % Free: 33 % (calc) (ref >25)
PSA, Free: 0.4 ng/mL
PSA, Total: 1.2 ng/mL (ref <=4.0)

## 2019-02-07 ENCOUNTER — Other Ambulatory Visit: Payer: Self-pay | Admitting: Sports Medicine

## 2019-02-08 ENCOUNTER — Encounter: Payer: Self-pay | Admitting: Sports Medicine

## 2019-03-17 ENCOUNTER — Encounter: Payer: Self-pay | Admitting: Sports Medicine

## 2019-04-14 LAB — HM DIABETES EYE EXAM

## 2019-04-18 ENCOUNTER — Encounter: Payer: Self-pay | Admitting: Sports Medicine

## 2019-04-25 ENCOUNTER — Other Ambulatory Visit: Payer: Self-pay

## 2019-04-25 ENCOUNTER — Ambulatory Visit (INDEPENDENT_AMBULATORY_CARE_PROVIDER_SITE_OTHER): Payer: Medicare HMO | Admitting: Sports Medicine

## 2019-04-25 VITALS — BP 124/77 | HR 78 | Temp 98.0°F | Wt 201.0 lb

## 2019-04-25 DIAGNOSIS — L821 Other seborrheic keratosis: Secondary | ICD-10-CM | POA: Diagnosis not present

## 2019-04-25 DIAGNOSIS — Z23 Encounter for immunization: Secondary | ICD-10-CM

## 2019-04-25 NOTE — Assessment & Plan Note (Signed)
Cryotherapy of 7 seborrheic keratoses on the face and neck. Return in 2 weeks.

## 2019-04-25 NOTE — Progress Notes (Signed)
Subjective:    CC: Skin lesions  HPI: This is a very pleasant 68 year old male, he has noted a large hyperpigmented lesion on his face, under the left eyelid.  He is here for further evaluation.  He gets minimal discomfort, burning sensations, mild, persistent, localized without radiation.  I reviewed the past medical history, family history, social history, surgical history, and allergies today and no changes were needed.  Please see the problem list section below in epic for further details.  Past Medical History: Past Medical History:  Diagnosis Date  . CAD (coronary artery disease)   . Diabetes mellitus   . Hiatal hernia    Past Surgical History: Past Surgical History:  Procedure Laterality Date  . APPENDECTOMY    . GASTRIC BYPASS     Social History: Social History   Socioeconomic History  . Marital status: Married    Spouse name: Not on file  . Number of children: 3  . Years of education: Not on file  . Highest education level: Not on file  Occupational History  . Not on file  Social Needs  . Financial resource strain: Not on file  . Food insecurity    Worry: Not on file    Inability: Not on file  . Transportation needs    Medical: Not on file    Non-medical: Not on file  Tobacco Use  . Smoking status: Former Research scientist (life sciences)  . Smokeless tobacco: Never Used  Substance and Sexual Activity  . Alcohol use: No  . Drug use: No  . Sexual activity: Yes  Lifestyle  . Physical activity    Days per week: Not on file    Minutes per session: Not on file  . Stress: Not on file  Relationships  . Social Herbalist on phone: Not on file    Gets together: Not on file    Attends religious service: Not on file    Active member of club or organization: Not on file    Attends meetings of clubs or organizations: Not on file    Relationship status: Not on file  Other Topics Concern  . Not on file  Social History Narrative  . Not on file   Family History: Family  History  Problem Relation Age of Onset  . Cancer Mother   . Stroke Father   . Heart disease Father    Allergies: Allergies  Allergen Reactions  . Penicillins Rash   Medications: See med rec.  Review of Systems: No fevers, chills, night sweats, weight loss, chest pain, or shortness of breath.   Objective:    General: Well Developed, well nourished, and in no acute distress.  Neuro: Alert and oriented x3, extra-ocular muscles intact, sensation grossly intact.  HEENT: Normocephalic, atraumatic, pupils equal round reactive to light, neck supple, no masses, no lymphadenopathy, thyroid nonpalpable.  Skin: Warm and dry, no rashes.  There are several seborrheic keratoses on his face and neck, the largest of which is almost 2 cm across, exophytic and under his left eyelid. Cardiac: Regular rate and rhythm, no murmurs rubs or gallops, no lower extremity edema.  Respiratory: Clear to auscultation bilaterally. Not using accessory muscles, speaking in full sentences.    Procedure:  Cryodestruction of #7 seborrheic keratoses on the face and neck Consent obtained and verified. Time-out conducted. Noted no overlying erythema, induration, or other signs of local infection. Completed without difficulty using Cryo-Gun. Advised to call if fevers/chills, erythema, induration, drainage, or persistent bleeding.  Impression  and Recommendations:    Seborrheic keratosis Cryotherapy of 7 seborrheic keratoses on the face and neck. Return in 2 weeks.   ___________________________________________ [name]. Dianah Field, M.D., ABFM., CAQSM. Primary Care and Sports Medicine [facility]  Adjunct Professor of [place] [employer] [facility] of Medicine

## 2019-04-25 NOTE — Patient Instructions (Signed)
Seborrheic Keratosis A seborrheic keratosis is a common, noncancerous (benign) skin growth. These growths are velvety, waxy, rough, tan, brown, or black spots that appear on the skin. These skin growths can be flat or raised, and scaly. What are the causes? The cause of this condition is not known. What increases the risk? You are more likely to develop this condition if you:  Have a family history of seborrheic keratosis.  Are 50 or older.  Are pregnant.  Have had estrogen replacement therapy. What are the signs or symptoms? Symptoms of this condition include growths on the face, chest, shoulders, back, or other areas. These growths:  Are usually painless, but may become irritated and itchy.  Can be yellow, brown, black, or other colors.  Are slightly raised or have a flat surface.  Are sometimes rough or wart-like in texture.  Are often velvety or waxy on the surface.  Are round or oval-shaped.  Often occur in groups, but may occur as a single growth. How is this diagnosed? This condition is diagnosed with a medical history and physical exam.  A sample of the growth may be tested (skin biopsy).  You may need to see a skin specialist (dermatologist). How is this treated? Treatment is not usually needed for this condition, unless the growths are irritated or bleed often.  You may also choose to have the growths removed if you do not like their appearance. ? Most commonly, these growths are treated with a procedure in which liquid nitrogen is applied to "freeze" off the growth (cryosurgery). ? They may also be burned off with electricity (electrocautery) or removed by scraping (curettage). Follow these instructions at home:  Watch your growth for any changes.  Keep all follow-up visits as told by your health care provider. This is important.  Do not scratch or pick at the growth or growths. This can cause them to become irritated or infected. Contact a health care  provider if:  You suddenly have many new growths.  Your growth bleeds, itches, or hurts.  Your growth suddenly becomes larger or changes color. Summary  A seborrheic keratosis is a common, noncancerous (benign) skin growth.  Treatment is not usually needed for this condition, unless the growths are irritated or bleed often.  Watch your growth for any changes.  Contact a health care provider if you suddenly have many new growths or your growth suddenly becomes larger or changes color.  Keep all follow-up visits as told by your health care provider. This is important. This information is not intended to replace advice given to you by your health care provider. Make sure you discuss any questions you have with your health care provider. Document Released: 09/02/2010 Document Revised: 12/13/2017 Document Reviewed: 12/13/2017 Elsevier Patient Education  2020 Elsevier Inc.  

## 2019-05-08 ENCOUNTER — Other Ambulatory Visit: Payer: Self-pay | Admitting: Sports Medicine

## 2019-05-09 ENCOUNTER — Other Ambulatory Visit: Payer: Self-pay

## 2019-05-09 ENCOUNTER — Ambulatory Visit (INDEPENDENT_AMBULATORY_CARE_PROVIDER_SITE_OTHER): Payer: Medicare HMO | Admitting: Sports Medicine

## 2019-05-09 ENCOUNTER — Encounter: Payer: Self-pay | Admitting: Sports Medicine

## 2019-05-09 DIAGNOSIS — L821 Other seborrheic keratosis: Secondary | ICD-10-CM | POA: Diagnosis not present

## 2019-05-09 DIAGNOSIS — Z9884 Bariatric surgery status: Secondary | ICD-10-CM | POA: Diagnosis not present

## 2019-05-09 NOTE — Assessment & Plan Note (Signed)
Multiple seborrheic keratoses were treated with cryotherapy today. The one under his left eye is persistent and was also treated. I am going to err on the side of multiple subtherapeutic treatments rather than treating him to aggressively. Return to see me in 2 weeks for this.

## 2019-05-09 NOTE — Progress Notes (Signed)
Subjective:    CC: Skin tags, seborrheic keratoses  HPI: James Frye returns, we treated a large seborrheic keratosis under his left eyelid.  He also had a few other lesions that were treated at the last visit, some of which are still minimally present.  I reviewed the past medical history, family history, social history, surgical history, and allergies today and no changes were needed.  Please see the problem list section below in epic for further details.  Past Medical History: Past Medical History:  Diagnosis Date  . CAD (coronary artery disease)   . Diabetes mellitus   . Hiatal hernia    Past Surgical History: Past Surgical History:  Procedure Laterality Date  . APPENDECTOMY    . GASTRIC BYPASS     Social History: Social History   Socioeconomic History  . Marital status: Married    Spouse name: Not on file  . Number of children: 3  . Years of education: Not on file  . Highest education level: Not on file  Occupational History  . Not on file  Social Needs  . Financial resource strain: Not on file  . Food insecurity    Worry: Not on file    Inability: Not on file  . Transportation needs    Medical: Not on file    Non-medical: Not on file  Tobacco Use  . Smoking status: Former Research scientist (life sciences)  . Smokeless tobacco: Never Used  Substance and Sexual Activity  . Alcohol use: No  . Drug use: No  . Sexual activity: Yes  Lifestyle  . Physical activity    Days per week: Not on file    Minutes per session: Not on file  . Stress: Not on file  Relationships  . Social Herbalist on phone: Not on file    Gets together: Not on file    Attends religious service: Not on file    Active member of club or organization: Not on file    Attends meetings of clubs or organizations: Not on file    Relationship status: Not on file  Other Topics Concern  . Not on file  Social History Narrative  . Not on file   Family History: Family History  Problem Relation Age of Onset  .  Cancer Mother   . Stroke Father   . Heart disease Father    Allergies: Allergies  Allergen Reactions  . Penicillins Rash   Medications: See med rec.  Review of Systems: No fevers, chills, night sweats, weight loss, chest pain, or shortness of breath.   Objective:    General: Well Developed, well nourished, and in no acute distress.  Neuro: Alert and oriented x3, extra-ocular muscles intact, sensation grossly intact.  HEENT: Normocephalic, atraumatic, pupils equal round reactive to light, neck supple, no masses, no lymphadenopathy, thyroid nonpalpable.  Skin: Warm and dry, no rashes.  Large seborrheic keratosis under the left eyelid, still present but better.  There are a few other scattered seborrheic keratoses. Cardiac: Regular rate and rhythm, no murmurs rubs or gallops, no lower extremity edema.  Respiratory: Clear to auscultation bilaterally. Not using accessory muscles, speaking in full sentences.  Procedure:  Cryodestruction of several facial seborrheic keratoses Consent obtained and verified. Time-out conducted. Noted no overlying erythema, induration, or other signs of local infection. Completed without difficulty using Cryo-Gun. Advised to call if fevers/chills, erythema, induration, drainage, or persistent bleeding.  Impression and Recommendations:    Seborrheic keratosis Multiple seborrheic keratoses were treated with cryotherapy today.  The one under his left eye is persistent and was also treated. I am going to err on the side of multiple subtherapeutic treatments rather than treating him to aggressively. Return to see me in 2 weeks for this.  H/O gastric bypass Did extremely well post gastric bypass, now with some COVID weight gain. He will try intermittent fasting and low carbohydrate diet, we can reevaluate over the next month, starting with 16 hours followed by 18.   ___________________________________________ [name]. Dianah Field, M.D., ABFM., CAQSM.  Primary Care and Sports Medicine [facility]  Adjunct Professor of [place] [employer] [facility] of Medicine

## 2019-05-09 NOTE — Assessment & Plan Note (Signed)
Did extremely well post gastric bypass, now with some COVID weight gain. He will try intermittent fasting and low carbohydrate diet, we can reevaluate over the next month, starting with 16 hours followed by 18.

## 2019-05-23 ENCOUNTER — Other Ambulatory Visit: Payer: Self-pay

## 2019-05-23 ENCOUNTER — Ambulatory Visit (INDEPENDENT_AMBULATORY_CARE_PROVIDER_SITE_OTHER): Payer: Medicare HMO | Admitting: Sports Medicine

## 2019-05-23 DIAGNOSIS — Z0000 Encounter for general adult medical examination without abnormal findings: Secondary | ICD-10-CM | POA: Diagnosis not present

## 2019-05-23 DIAGNOSIS — L821 Other seborrheic keratosis: Secondary | ICD-10-CM | POA: Diagnosis not present

## 2019-05-23 MED ORDER — SHINGRIX 50 MCG/0.5ML IM SUSR: 0.5000 mL | Freq: Once | INTRAMUSCULAR | 0 refills | Status: AC

## 2019-05-23 NOTE — Progress Notes (Signed)
Subjective:    CC: Follow-up  HPI: James Frye returns, we have been treating a large seborrheic keratosis on his left cheek, it continues to improve considerably.  I reviewed the past medical history, family history, social history, surgical history, and allergies today and no changes were needed.  Please see the problem list section below in epic for further details.  Past Medical History: Past Medical History:  Diagnosis Date  . CAD (coronary artery disease)   . Diabetes mellitus   . Hiatal hernia    Past Surgical History: Past Surgical History:  Procedure Laterality Date  . APPENDECTOMY    . GASTRIC BYPASS     Social History: Social History   Socioeconomic History  . Marital status: Married    Spouse name: Not on file  . Number of children: 3  . Years of education: Not on file  . Highest education level: Not on file  Occupational History  . Not on file  Social Needs  . Financial resource strain: Not on file  . Food insecurity    Worry: Not on file    Inability: Not on file  . Transportation needs    Medical: Not on file    Non-medical: Not on file  Tobacco Use  . Smoking status: Former Research scientist (life sciences)  . Smokeless tobacco: Never Used  Substance and Sexual Activity  . Alcohol use: No  . Drug use: No  . Sexual activity: Yes  Lifestyle  . Physical activity    Days per week: Not on file    Minutes per session: Not on file  . Stress: Not on file  Relationships  . Social Herbalist on phone: Not on file    Gets together: Not on file    Attends religious service: Not on file    Active member of club or organization: Not on file    Attends meetings of clubs or organizations: Not on file    Relationship status: Not on file  Other Topics Concern  . Not on file  Social History Narrative  . Not on file   Family History: Family History  Problem Relation Age of Onset  . Cancer Mother   . Stroke Father   . Heart disease Father    Allergies: Allergies   Allergen Reactions  . Penicillins Rash   Medications: See med rec.  Review of Systems: No fevers, chills, night sweats, weight loss, chest pain, or shortness of breath.   Objective:    General: Well Developed, well nourished, and in no acute distress.  Neuro: Alert and oriented x3, extra-ocular muscles intact, sensation grossly intact.  HEENT: Normocephalic, atraumatic, pupils equal round reactive to light, neck supple, no masses, no lymphadenopathy, thyroid nonpalpable.  Skin: Warm and dry, no rashes. Cardiac: Regular rate and rhythm, no murmurs rubs or gallops, no lower extremity edema.  Respiratory: Clear to auscultation bilaterally. Not using accessory muscles, speaking in full sentences.    04/25/2019      05/23/2019   Procedure:  Cryodestruction of facial seborrheic keratoses Consent obtained and verified. Time-out conducted. Noted no overlying erythema, induration, or other signs of local infection. Completed without difficulty using Cryo-Gun. Advised to call if fevers/chills, erythema, induration, drainage, or persistent bleeding.  Impression and Recommendations:    Seborrheic keratosis Repeat cryotherapy of the left facial seborrheic keratosis. It is doing really really well.   ___________________________________________ [name]. Dianah Field, M.D., ABFM., CAQSM. Primary Care and Sports Medicine [facility]  Adjunct Professor of [facility]  Medicine  [employer] [facility] [employer] [facility] of Medicine

## 2019-05-23 NOTE — Addendum Note (Signed)
Addended by: Silverio Decamp on: 05/23/2019 02:44 PM   Modules accepted: Orders

## 2019-05-23 NOTE — Assessment & Plan Note (Signed)
Repeat cryotherapy of the left facial seborrheic keratosis. It is doing really really well.

## 2019-05-27 ENCOUNTER — Encounter: Payer: Self-pay | Admitting: Sports Medicine

## 2019-08-01 ENCOUNTER — Other Ambulatory Visit: Payer: Self-pay | Admitting: Sports Medicine

## 2019-09-25 ENCOUNTER — Encounter: Payer: Self-pay | Admitting: Sports Medicine

## 2019-09-25 ENCOUNTER — Ambulatory Visit (INDEPENDENT_AMBULATORY_CARE_PROVIDER_SITE_OTHER): Payer: Medicare Other | Admitting: Sports Medicine

## 2019-09-25 ENCOUNTER — Other Ambulatory Visit: Payer: Self-pay

## 2019-09-25 DIAGNOSIS — E1169 Type 2 diabetes mellitus with other specified complication: Secondary | ICD-10-CM

## 2019-09-25 DIAGNOSIS — Z0000 Encounter for general adult medical examination without abnormal findings: Secondary | ICD-10-CM

## 2019-09-25 DIAGNOSIS — E119 Type 2 diabetes mellitus without complications: Secondary | ICD-10-CM

## 2019-09-25 DIAGNOSIS — N521 Erectile dysfunction due to diseases classified elsewhere: Secondary | ICD-10-CM

## 2019-09-25 MED ORDER — SHINGRIX 50 MCG/0.5ML IM SUSR: 0.5000 mL | Freq: Once | INTRAMUSCULAR | 0 refills | Status: AC

## 2019-09-25 NOTE — Assessment & Plan Note (Signed)
Historically well controlled, rechecking hemoglobin A1c. He did have some renal insufficiency on the last set of labs, we increased his hydration, rechecking CMP.

## 2019-09-25 NOTE — Progress Notes (Signed)
Subjective:    CC: Annual Physical Exam  HPI:  This patient is here for their annual physical  I reviewed the past medical history, family history, social history, surgical history, and allergies today and no changes were needed.  Please see the problem list section below in epic for further details.  Past Medical History: Past Medical History:  Diagnosis Date  . CAD (coronary artery disease)   . Diabetes mellitus   . Hiatal hernia    Past Surgical History: Past Surgical History:  Procedure Laterality Date  . APPENDECTOMY    . GASTRIC BYPASS     Social History: Social History   Socioeconomic History  . Marital status: Married    Spouse name: Not on file  . Number of children: 3  . Years of education: Not on file  . Highest education level: Not on file  Occupational History  . Not on file  Tobacco Use  . Smoking status: Former Research scientist (life sciences)  . Smokeless tobacco: Never Used  Substance and Sexual Activity  . Alcohol use: No  . Drug use: No  . Sexual activity: Yes  Other Topics Concern  . Not on file  Social History Narrative  . Not on file   Social Determinants of Health   Financial Resource Strain:   . Difficulty of Paying Living Expenses: Not on file  Food Insecurity:   . Worried About Charity fundraiser in the Last Year: Not on file  . Ran Out of Food in the Last Year: Not on file  Transportation Needs:   . Lack of Transportation (Medical): Not on file  . Lack of Transportation (Non-Medical): Not on file  Physical Activity:   . Days of Exercise per Week: Not on file  . Minutes of Exercise per Session: Not on file  Stress:   . Feeling of Stress : Not on file  Social Connections:   . Frequency of Communication with Friends and Family: Not on file  . Frequency of Social Gatherings with Friends and Family: Not on file  . Attends Religious Services: Not on file  . Active Member of Clubs or Organizations: Not on file  . Attends Archivist Meetings: Not  on file  . Marital Status: Not on file   Family History: Family History  Problem Relation Age of Onset  . Cancer Mother   . Stroke Father   . Heart disease Father    Allergies: Allergies  Allergen Reactions  . Penicillins Rash   Medications: See med rec.  Review of Systems: No headache, visual changes, nausea, vomiting, diarrhea, constipation, dizziness, abdominal pain, skin rash, fevers, chills, night sweats, swollen lymph nodes, weight loss, chest pain, body aches, joint swelling, muscle aches, shortness of breath, mood changes, visual or auditory hallucinations.  Objective:    General: Well Developed, well nourished, and in no acute distress.  Neuro: Alert and oriented x3, extra-ocular muscles intact, sensation grossly intact. Cranial nerves II through XII are intact, motor, sensory, and coordinative functions are all intact. HEENT: Normocephalic, atraumatic, pupils equal round reactive to light, neck supple, no masses, no lymphadenopathy, thyroid nonpalpable. Oropharynx, nasopharynx, external ear canals are unremarkable. Skin: Warm and dry, no rashes noted.  Cardiac: Regular rate and rhythm, no murmurs rubs or gallops.  Respiratory: Clear to auscultation bilaterally. Not using accessory muscles, speaking in full sentences.  Abdominal: Soft, nontender, nondistended, positive bowel sounds, no masses, no organomegaly.  Musculoskeletal: Shoulder, elbow, wrist, hip, knee, ankle stable, and with full range of motion.  Impression and Recommendations:    The patient was counselled, risk factors were discussed, anticipatory guidance given.  Annual physical exam CPE as above. Ordering Shingrix.   Diabetes mellitus type 2, controlled Historically well controlled, rechecking hemoglobin A1c. He did have some renal insufficiency on the last set of labs, we increased his hydration, rechecking CMP.  Erectile dysfunction associated with type 2 diabetes mellitus We have tried multiple  treatment modalities including 5 phosphodiesterase inhibitors, intracavernosal prostaglandin injections. At this point he is going to be looking into the implantable penile prosthesis with urology.   ___________________________________________ [name]. Dianah Field, M.D., ABFM., CAQSM. Primary Care and Sports Medicine [facility]  Adjunct Professor of [place] of [facility] [employer]

## 2019-09-25 NOTE — Assessment & Plan Note (Addendum)
CPE as above. Ordering Shingrix.

## 2019-09-25 NOTE — Assessment & Plan Note (Signed)
We have tried multiple treatment modalities including 5 phosphodiesterase inhibitors, intracavernosal prostaglandin injections. At this point he is going to be looking into the implantable penile prosthesis with urology.

## 2019-09-26 LAB — COMPLETE METABOLIC PANEL WITH GFR
AG Ratio: 1.5 (calc) (ref 1.0–2.5)
ALT: 11 U/L (ref 9–46)
AST: 11 U/L (ref 10–35)
Albumin: 4.4 g/dL (ref 3.6–5.1)
Alkaline phosphatase (APISO): 65 U/L (ref 35–144)
BUN/Creatinine Ratio: 15 (calc) (ref 6–22)
BUN: 21 mg/dL (ref 7–25)
CO2: 27 mmol/L (ref 20–32)
Calcium: 9.5 mg/dL (ref 8.6–10.3)
Chloride: 104 mmol/L (ref 98–110)
Creat: 1.38 mg/dL — ABNORMAL HIGH (ref 0.70–1.25)
GFR, Est African American: 61 mL/min/{1.73_m2} (ref >=60)
GFR, Est Non African American: 53 mL/min/{1.73_m2} — ABNORMAL LOW (ref >=60)
Globulin: 2.9 g/dL (calc) (ref 1.9–3.7)
Glucose, Bld: 108 mg/dL — ABNORMAL HIGH (ref 65–99)
Potassium: 4.6 mmol/L (ref 3.5–5.3)
Sodium: 139 mmol/L (ref 135–146)
Total Bilirubin: 0.5 mg/dL (ref 0.2–1.2)
Total Protein: 7.3 g/dL (ref 6.1–8.1)

## 2019-09-26 LAB — HEMOGLOBIN A1C
Hgb A1c MFr Bld: 6.3 % of total Hgb — ABNORMAL HIGH (ref <5.7)
Mean Plasma Glucose: 134 (calc)
eAG (mmol/L): 7.4 (calc)

## 2019-09-30 MED ORDER — CONTINUOUS BLOOD GLUC SENSOR MISC: 1.0000 | 11 refills | Status: DC

## 2019-10-02 MED ORDER — ZOSTER VAC RECOMB ADJUVANTED 50 MCG/0.5ML IM SUSR: 0.5000 mL | Freq: Once | INTRAMUSCULAR | 1 refills | Status: AC

## 2019-10-02 NOTE — Addendum Note (Signed)
Addended by: Towana Badger on: 10/02/2019 01:58 PM   Modules accepted: Orders

## 2019-10-03 ENCOUNTER — Other Ambulatory Visit: Payer: Self-pay | Admitting: Sports Medicine

## 2019-10-03 MED ORDER — CONTINUOUS BLOOD GLUC SENSOR MISC: 1.0000 | 11 refills | Status: DC

## 2019-10-05 ENCOUNTER — Ambulatory Visit: Payer: Medicare Other | Attending: Internal Medicine

## 2019-10-05 DIAGNOSIS — Z23 Encounter for immunization: Secondary | ICD-10-CM | POA: Insufficient documentation

## 2019-10-05 NOTE — Progress Notes (Signed)
   Covid-19 Vaccination Clinic  Name:  James Frye    MRN: [id number] DOB: [birth year]  10/05/2019  [name] was observed post Covid-19 immunization for 15 minutes without incidence. He was provided with Vaccine Information Sheet and instruction to access the V-Safe system.   [name] was instructed to call 911 with any severe reactions post vaccine: Marland Kitchen Difficulty breathing  . Swelling of your face and throat  . A fast heartbeat  . A bad rash all over your body  . Dizziness and weakness    Immunizations Administered    Name Date Dose VIS Date Route   Pfizer COVID-19 Vaccine 10/05/2019  9:03 AM 0.3 mL 07/25/2019 Intramuscular   Manufacturer: [place]   Lot: [id number]   [place]: [id number]

## 2019-10-28 ENCOUNTER — Ambulatory Visit: Payer: Medicare Other | Attending: Internal Medicine

## 2019-10-28 DIAGNOSIS — Z23 Encounter for immunization: Secondary | ICD-10-CM

## 2019-10-28 NOTE — Progress Notes (Signed)
   Covid-19 Vaccination Clinic  Name:  James Frye    MRN: [id number] DOB: [birth year]  10/28/2019  James Frye was observed post Covid-19 immunization for 15 minutes without incident. He was provided with Vaccine Information Sheet and instruction to access the V-Safe system.   James Frye was instructed to call 911 with any severe reactions post vaccine: Marland Kitchen Difficulty breathing  . Swelling of face and throat  . A fast heartbeat  . A bad rash all over body  . Dizziness and weakness   Immunizations Administered    Name Date Dose VIS Date Route   Pfizer COVID-19 Vaccine 10/28/2019  2:43 PM 0.3 mL 07/25/2019 Intramuscular   Manufacturer: [place]   Lot: [id number]   [place]: [id number]

## 2019-10-30 MED ORDER — METFORMIN HCL 1000 MG PO TABS: ORAL_TABLET | ORAL | 0 refills | Status: DC

## 2019-11-12 NOTE — Telephone Encounter (Signed)
Yes, I think this is a PA, that brand was the one he told us was preferred by his insurance.  Let me know if I need to do anything else here.

## 2019-11-13 NOTE — Telephone Encounter (Signed)
[id number]?

## 2019-12-30 NOTE — Telephone Encounter (Signed)
Cindy?  Any news on the DexCom meter auth?

## 2020-01-26 ENCOUNTER — Other Ambulatory Visit: Payer: Self-pay | Admitting: Sports Medicine

## 2020-01-26 MED ORDER — METFORMIN HCL 1000 MG PO TABS: ORAL_TABLET | ORAL | 3 refills | Status: DC

## 2020-03-18 ENCOUNTER — Ambulatory Visit (INDEPENDENT_AMBULATORY_CARE_PROVIDER_SITE_OTHER): Payer: Medicare Other | Admitting: Family Medicine

## 2020-03-18 ENCOUNTER — Other Ambulatory Visit: Payer: Self-pay

## 2020-03-18 ENCOUNTER — Encounter: Payer: Self-pay | Admitting: Family Medicine

## 2020-03-18 DIAGNOSIS — R05 Cough: Secondary | ICD-10-CM | POA: Diagnosis not present

## 2020-03-18 DIAGNOSIS — R059 Cough, unspecified: Secondary | ICD-10-CM

## 2020-03-18 MED ORDER — HYDROCODONE-HOMATROPINE 5-1.5 MG/5ML PO SYRP: 5.0000 mL | ORAL_SOLUTION | Freq: Three times a day (TID) | ORAL | 0 refills | Status: DC | PRN

## 2020-03-18 MED ORDER — FLUTICASONE PROPIONATE 50 MCG/ACT NA SUSP: NASAL | 0 refills | Status: DC

## 2020-03-18 NOTE — Progress Notes (Signed)
James Frye - 69 y.o. male MRN [id number]  Date of birth: 27-May-1951  Subjective Chief Complaint  Patient presents with  . Cough    HPI James Frye is a 69 y.o. male here today with complaint of cough x4 weeks.  He has history of allergic rhinitis.  He does not have history of COPD or asthma.  Cough is worse during the evenings.  He does have post nasal drainage that he feels may be triggering cough.  He is not using flonase regularly.  He denies symptoms of reflux, illness/cold preceding onset of cough, fever, chills,  chest pain or shortness of breath.    ROS:  A comprehensive ROS was completed and negative except as noted per HPI  Allergies  Allergen Reactions  . Penicillins Rash    Past Medical History:  Diagnosis Date  . CAD (coronary artery disease)   . Diabetes mellitus   . Hiatal hernia     Past Surgical History:  Procedure Laterality Date  . APPENDECTOMY    . GASTRIC BYPASS      Social History   Socioeconomic History  . Marital status: Married    Spouse name: Not on file  . Number of children: 3  . Years of education: Not on file  . Highest education level: Not on file  Occupational History  . Not on file  Tobacco Use  . Smoking status: Former Research scientist (life sciences)  . Smokeless tobacco: Never Used  Substance and Sexual Activity  . Alcohol use: No  . Drug use: No  . Sexual activity: Yes  Other Topics Concern  . Not on file  Social History Narrative  . Not on file   Social Determinants of Health   Financial Resource Strain:   . Difficulty of Paying Living Expenses:   Food Insecurity:   . Worried About Charity fundraiser in the Last Year:   . Arboriculturist in the Last Year:   Transportation Needs:   . Film/video editor (Medical):   Marland Kitchen Lack of Transportation (Non-Medical):   Physical Activity:   . Days of Exercise per Week:   . Minutes of Exercise per Session:   Stress:   . Feeling of Stress :   Social Connections:   . Frequency of  Communication with Friends and Family:   . Frequency of Social Gatherings with Friends and Family:   . Attends Religious Services:   . Active Member of Clubs or Organizations:   . Attends Archivist Meetings:   Marland Kitchen Marital Status:     Family History  Problem Relation Age of Onset  . Cancer Mother   . Stroke Father   . Heart disease Father     Health Maintenance  Topic Date Due  . FOOT EXAM  06/18/2018  . COLONOSCOPY  10/13/2019  . URINE MICROALBUMIN  01/30/2020  . INFLUENZA VACCINE  03/14/2020  . HEMOGLOBIN A1C  03/24/2020  . OPHTHALMOLOGY EXAM  04/13/2020  . TETANUS/TDAP  02/24/2024  . COVID-19 Vaccine  Completed  . Hepatitis C Screening  Completed  . PNA vac Low Risk Adult  Discontinued     ----------------------------------------------------------------------------------------------------------------------------------------------------------------------------------------------------------------- Physical Exam BP (!) 141/84 (BP Location: Left Arm, Patient Position: Sitting, Cuff Size: Normal)   Pulse 83   Temp 98.2 F (36.8 C) (Temporal)   Ht 5' 6.93" (1.7 m)   Wt 199 lb 3.2 oz (90.4 kg)   SpO2 100%   BMI 31.27 kg/m   Physical Exam Constitutional:  Appearance: Normal appearance.  HENT:     Head: Normocephalic and atraumatic.     Nose:     Comments: Nasal turbinates edematous     Mouth/Throat:     Mouth: Mucous membranes are moist.  Eyes:     General: No scleral icterus. Cardiovascular:     Rate and Rhythm: Normal rate.  Pulmonary:     Effort: Pulmonary effort is normal.     Breath sounds: Normal breath sounds.  Skin:    General: Skin is warm and dry.  Neurological:     Mental Status: He is alert.  Psychiatric:        Mood and Affect: Mood normal.        Behavior: Behavior normal.      ------------------------------------------------------------------------------------------------------------------------------------------------------------------------------------------------------------------- Assessment and Plan  Cough Cough seems to be caused by post nasal drainage Recommend using flonase consistently.  Short term hycodan in the evenings Instructed to follow up if this is not improving over the next couple of weeks.    Meds ordered this encounter  Medications  . fluticasone (FLONASE) 50 MCG/ACT nasal spray    Sig: USE 1 SPRAY(S) IN EACH NOSTRIL TWICE DAILY. USE LEFT HAND FOR RIGHT NOSTRIL, AND RIGHT HAND FOR LEFT NOSTRIL    Dispense:  16 g    Refill:  0  . HYDROcodone-homatropine (HYCODAN) 5-1.5 MG/5ML syrup    Sig: Take 5 mLs by mouth every 8 (eight) hours as needed for cough.    Dispense:  120 mL    Refill:  0    No follow-ups on file.    This visit occurred during the SARS-CoV-2 public health emergency.  Safety protocols were in place, including screening questions prior to the visit, additional usage of staff PPE, and extensive cleaning of exam room while observing appropriate contact time as indicated for disinfecting solutions.

## 2020-03-18 NOTE — Assessment & Plan Note (Signed)
Cough seems to be caused by post nasal drainage Recommend using flonase consistently.  Short term hycodan in the evenings Instructed to follow up if this is not improving over the next couple of weeks.

## 2020-03-18 NOTE — Patient Instructions (Signed)

## 2020-03-21 ENCOUNTER — Other Ambulatory Visit: Payer: Self-pay | Admitting: Family Medicine

## 2020-04-07 ENCOUNTER — Encounter: Payer: Self-pay | Admitting: Sports Medicine

## 2020-04-07 ENCOUNTER — Ambulatory Visit (INDEPENDENT_AMBULATORY_CARE_PROVIDER_SITE_OTHER): Payer: Medicare Other

## 2020-04-07 ENCOUNTER — Other Ambulatory Visit: Payer: Self-pay

## 2020-04-07 ENCOUNTER — Ambulatory Visit (INDEPENDENT_AMBULATORY_CARE_PROVIDER_SITE_OTHER): Payer: Medicare Other | Admitting: Sports Medicine

## 2020-04-07 DIAGNOSIS — R05 Cough: Secondary | ICD-10-CM

## 2020-04-07 DIAGNOSIS — R053 Chronic cough: Secondary | ICD-10-CM

## 2020-04-07 MED ORDER — AZELASTINE HCL 0.1 % NA SOLN: 2.0000 | Freq: Two times a day (BID) | NASAL | 1 refills | Status: DC

## 2020-04-07 MED ORDER — CHLORPHENIRAMINE-DM 2-15 MG/5ML PO LIQD: 10.0000 mL | Freq: Every day | ORAL | 0 refills | Status: DC

## 2020-04-07 NOTE — Progress Notes (Signed)
    Procedures performed today:    None.  Independent interpretation of notes and tests performed by another provider:   None.  Brief History, Exam, Impression, and Recommendations:    Chronic cough James Frye is a pleasant 69 year old male, he has had 3 months of a cough, particularly nocturnal. The shortness of breath, leg swelling, orthopnea, PND. He does have a long history of postnasal drip and perennial allergic rhinitis. Flonase has been used for decades. Discontinue Flonase, switching to azelastine nasal spray. I am also discontinue his narcotic cough suppressant and switch to chlorpheniramine/dextromethorphan to use at night. Adding a chest x-ray. He is a non-smoker and has not smoked in 50 years. If insufficient improvement over the next month we will consider pre and postbronchodilator lung function testing and advanced cross-sectional imaging of his chest. We can certainly consider laboratory testing at the follow-up visit as well for things like tuberculosis, Goodpasture syndrome, alpha-1 antitrypsin deficiency, vasculitides, sarcoidosis.    ___________________________________________ [name]. Dianah Field, M.D., ABFM., CAQSM. Primary Care and [place] Instructor of [place] of [facility] of Medicine

## 2020-04-07 NOTE — Assessment & Plan Note (Signed)
James Frye is a pleasant 69 year old male, he has had 3 months of a cough, particularly nocturnal. The shortness of breath, leg swelling, orthopnea, PND. He does have a long history of postnasal drip and perennial allergic rhinitis. Flonase has been used for decades. Discontinue Flonase, switching to azelastine nasal spray. I am also discontinue his narcotic cough suppressant and switch to chlorpheniramine/dextromethorphan to use at night. Adding a chest x-ray. He is a non-smoker and has not smoked in 50 years. If insufficient improvement over the next month we will consider pre and postbronchodilator lung function testing and advanced cross-sectional imaging of his chest. We can certainly consider laboratory testing at the follow-up visit as well for things like tuberculosis, Goodpasture syndrome, alpha-1 antitrypsin deficiency, vasculitides, sarcoidosis.

## 2020-04-09 MED ORDER — CLOBETASOL PROPIONATE 0.05 % EX OINT
1.0000 | TOPICAL_OINTMENT | Freq: Two times a day (BID) | CUTANEOUS | 0 refills | Status: DC
Start: 2020-04-09 — End: 2022-02-09

## 2020-05-05 ENCOUNTER — Other Ambulatory Visit: Payer: Self-pay

## 2020-05-05 ENCOUNTER — Ambulatory Visit (INDEPENDENT_AMBULATORY_CARE_PROVIDER_SITE_OTHER): Payer: Medicare Other | Admitting: Sports Medicine

## 2020-05-05 ENCOUNTER — Encounter: Payer: Self-pay | Admitting: Sports Medicine

## 2020-05-05 VITALS — BP 137/83 | HR 83 | Ht 67.0 in | Wt 199.0 lb

## 2020-05-05 DIAGNOSIS — Z23 Encounter for immunization: Secondary | ICD-10-CM

## 2020-05-05 DIAGNOSIS — F5101 Primary insomnia: Secondary | ICD-10-CM

## 2020-05-05 DIAGNOSIS — J849 Interstitial pulmonary disease, unspecified: Secondary | ICD-10-CM

## 2020-05-05 DIAGNOSIS — R05 Cough: Secondary | ICD-10-CM

## 2020-05-05 DIAGNOSIS — R0602 Shortness of breath: Secondary | ICD-10-CM | POA: Diagnosis not present

## 2020-05-05 DIAGNOSIS — R053 Chronic cough: Secondary | ICD-10-CM

## 2020-05-05 MED ORDER — HYDROXYZINE HCL 50 MG PO TABS: 50.0000 mg | ORAL_TABLET | Freq: Every day | ORAL | 3 refills | Status: DC

## 2020-05-05 NOTE — Assessment & Plan Note (Addendum)
Abbe [place] returns, he is a pleasant 69 year old male with 4 months of chronic cough, predominantly nocturnal, he does have a long history of postnasal drip, perennial allergic rhinitis, Flonase has been used for decades, he was switched to azelastine, no improvement. There has been no shortness of breath, leg swelling, orthopnea, or PND. We switched from his narcotic cough suppressant to chlorpheniramine/dextromethorphan without much improvement. Chest x-ray has been unrevealing. He is a non-smoker and has not smoked in 50 years. At this point we will proceed with pre and postbronchodilator spirometry, CT high-resolution of his chest, as well as testing for tuberculosis, Goodpasture syndrome, alpha-1 antitrypsin deficiency, vasculitides, and sarcoidosis. I would also like a second opinion from pulmonology.  Labs and CT are unremarkable, keep follow-up with specialist.  Potential pulmonary arterial hypertension, this would indicate potential need for a sleep study, I would like him to discuss this with pulmonology.

## 2020-05-05 NOTE — Assessment & Plan Note (Signed)
Restarting hydroxyzine.

## 2020-05-05 NOTE — Progress Notes (Addendum)
    Procedures performed today:    None.  Independent interpretation of notes and tests performed by another provider:   None.  Brief History, Exam, Impression, and Recommendations:    Chronic cough James Frye returns, he is a pleasant 69 year old male with 4 months of chronic cough, predominantly nocturnal, he does have a long history of postnasal drip, perennial allergic rhinitis, Flonase has been used for decades, he was switched to azelastine, no improvement. There has been no shortness of breath, leg swelling, orthopnea, or PND. We switched from his narcotic cough suppressant to chlorpheniramine/dextromethorphan without much improvement. Chest x-ray has been unrevealing. He is a non-smoker and has not smoked in 50 years. At this point we will proceed with pre and postbronchodilator spirometry, CT high-resolution of his chest, as well as testing for tuberculosis, Goodpasture syndrome, alpha-1 antitrypsin deficiency, vasculitides, and sarcoidosis. I would also like a second opinion from pulmonology.  Labs and CT are unremarkable, keep follow-up with specialist.  Potential pulmonary arterial hypertension, this would indicate potential need for a sleep study, I would like him to discuss this with pulmonology.  Insomnia Restarting hydroxyzine.    ___________________________________________ [name]. Dianah Field, M.D., ABFM., CAQSM. Primary Care and [place] Instructor of [place] of [facility]

## 2020-05-07 ENCOUNTER — Other Ambulatory Visit: Payer: Self-pay

## 2020-05-07 ENCOUNTER — Ambulatory Visit (INDEPENDENT_AMBULATORY_CARE_PROVIDER_SITE_OTHER): Payer: Medicare Other

## 2020-05-07 DIAGNOSIS — J849 Interstitial pulmonary disease, unspecified: Secondary | ICD-10-CM

## 2020-05-07 DIAGNOSIS — R05 Cough: Secondary | ICD-10-CM | POA: Diagnosis not present

## 2020-05-07 DIAGNOSIS — R0602 Shortness of breath: Secondary | ICD-10-CM | POA: Diagnosis not present

## 2020-05-07 DIAGNOSIS — R918 Other nonspecific abnormal finding of lung field: Secondary | ICD-10-CM | POA: Diagnosis not present

## 2020-05-07 DIAGNOSIS — R053 Chronic cough: Secondary | ICD-10-CM

## 2020-05-07 LAB — GLOMERULAR BASEMENT MEMBRANE ANTIBODIES: GBM Ab: <1 AI

## 2020-05-07 LAB — ANGIOTENSIN CONVERTING ENZYME: Angiotensin-Converting Enzyme: 30 U/L (ref 9–67)

## 2020-05-07 LAB — QUANTIFERON-TB GOLD PLUS
Mitogen-NIL: >10 IU/mL
NIL: 0.02 IU/mL
QuantiFERON-TB Gold Plus: NEGATIVE
TB1-NIL: 0 IU/mL
TB2-NIL: 0.01 IU/mL

## 2020-05-07 LAB — ALPHA-1-ANTITRYPSIN: A-1 Antitrypsin, Ser: 129 mg/dL (ref 83–199)

## 2020-05-07 LAB — ANCA SCREEN W REFLEX TITER: ANCA Screen: NEGATIVE

## 2020-05-14 ENCOUNTER — Encounter: Payer: Self-pay | Admitting: Pulmonary Disease

## 2020-05-14 ENCOUNTER — Ambulatory Visit (INDEPENDENT_AMBULATORY_CARE_PROVIDER_SITE_OTHER): Payer: Medicare Other | Admitting: Pulmonary Disease

## 2020-05-14 ENCOUNTER — Other Ambulatory Visit: Payer: Self-pay

## 2020-05-14 VITALS — BP 126/64 | HR 72 | Temp 97.4°F | Ht 68.0 in | Wt 201.0 lb

## 2020-05-14 DIAGNOSIS — R0602 Shortness of breath: Secondary | ICD-10-CM | POA: Diagnosis not present

## 2020-05-14 DIAGNOSIS — G4733 Obstructive sleep apnea (adult) (pediatric): Secondary | ICD-10-CM

## 2020-05-14 DIAGNOSIS — R059 Cough, unspecified: Secondary | ICD-10-CM | POA: Diagnosis not present

## 2020-05-14 DIAGNOSIS — I272 Pulmonary hypertension, unspecified: Secondary | ICD-10-CM

## 2020-05-14 MED ORDER — OMEPRAZOLE 40 MG PO CPDR: 40.0000 mg | DELAYED_RELEASE_CAPSULE | Freq: Every day | ORAL | 5 refills | Status: DC

## 2020-05-14 NOTE — Patient Instructions (Signed)
Continue Astelin spray We'll start you on chlorpheniramine 8 mg 3 times a day.  This medication is available over-the-counter We'll start Prilosec 40 mg a day for silent reflux  Schedule pulmonary function tests, echocardiogram for evaluation of pulmonary hypertension Schedule home sleep study for evaluation of sleep apnea  Follow-up in 1 to 2 months.

## 2020-05-14 NOTE — Progress Notes (Signed)
James Frye    [id number]    01-15-1951  Primary Care Physician:[name], [name], MD  Referring Physician: Silverio Decamp, [address] [place],  [place] [address]  Chief complaint: Consult for chronic cough  HPI: 69 year old with history of diabetes, postnasal drip, allergic rhinitis  Complains of chronic cough 2 to 3 months, cough is nonproductive in nature, occurs more at night and on lying down Initially started on Flonase and narcotic cough suppressant with little improvement Therapy changed to chlorpheniramine DM cough syrup, Astelin in August by his by Dr. Dianah Field, primary care without any improvement and hence referred to pulmonary.  He had a high-res CT in late September 2021 with no evidence of interstitial lung disease, possible pulmonary hypertension.  Lab work including alpha-1 antitrypsin, ACE, GBM antibodies, QuantiFERON are negative.  Patient has history of GERD and sleep apnea which improved after he got a gastric bypass in 2010.  He took himself off CPAP Has about 20 pound weight gain over the past few years during the Covid pandemic and his wife tells him that he is snoring again though not as bad as before  Pets: No pets Occupation: Used to work in [employer].  Currently retired Exposures: No known exposures.  No mold, hot tub, Jacuzzi.  No feather pillows or comforters Smoking history: Used to smoke as a teenager Travel history: No significant travel history Relevant family history: Mother had amyloidosis affecting the lung  Outpatient Encounter Medications as of 05/14/2020  Medication Sig  . azelastine (ASTELIN) 0.1 % nasal spray Place 2 sprays into both nostrils 2 (two) times daily. Use in each nostril as directed  . Chlorpheniramine-DM 2-15 MG/5ML LIQD Take 10-15 mLs by mouth at bedtime.  . clobetasol ointment (TEMOVATE) 1.88 % Apply 1 application topically 2 (two) times daily.  . hydrOXYzine (ATARAX/VISTARIL) 50  MG tablet Take 1 tablet (50 mg total) by mouth at bedtime.  . metFORMIN (GLUCOPHAGE) 1000 MG tablet Take 1 tablet by mouth once daily with breakfast   No facility-administered encounter medications on file as of 05/14/2020.    Allergies as of 05/14/2020 - Review Complete 05/14/2020  Allergen Reaction Noted  . Penicillins Rash 07/04/2012    Past Medical History:  Diagnosis Date  . CAD (coronary artery disease)   . Diabetes mellitus   . Hiatal hernia     Past Surgical History:  Procedure Laterality Date  . APPENDECTOMY    . GASTRIC BYPASS      Family History  Problem Relation Age of Onset  . Cancer Mother   . Stroke Father   . Heart disease Father     Social History   Socioeconomic History  . Marital status: Married    Spouse name: Not on file  . Number of children: 3  . Years of education: Not on file  . Highest education level: Not on file  Occupational History  . Not on file  Tobacco Use  . Smoking status: Former Research scientist (life sciences)  . Smokeless tobacco: Never Used  Substance and Sexual Activity  . Alcohol use: No  . Drug use: No  . Sexual activity: Yes  Other Topics Concern  . Not on file  Social History Narrative  . Not on file   Social Determinants of Health   Financial Resource Strain:   . Difficulty of Paying Living Expenses: Not on file  Food Insecurity:   . Worried About Charity fundraiser in the  Last Year: Not on file  . Ran Out of Food in the Last Year: Not on file  Transportation Needs:   . Lack of Transportation (Medical): Not on file  . Lack of Transportation (Non-Medical): Not on file  Physical Activity:   . Days of Exercise per Week: Not on file  . Minutes of Exercise per Session: Not on file  Stress:   . Feeling of Stress : Not on file  Social Connections:   . Frequency of Communication with Friends and Family: Not on file  . Frequency of Social Gatherings with Friends and Family: Not on file  . Attends Religious Services: Not on file  .  Active Member of Clubs or Organizations: Not on file  . Attends Archivist Meetings: Not on file  . Marital Status: Not on file  Intimate Partner Violence:   . Fear of Current or Ex-Partner: Not on file  . Emotionally Abused: Not on file  . Physically Abused: Not on file  . Sexually Abused: Not on file    Review of systems: Review of Systems  Constitutional: Negative for fever and chills.  HENT: Negative.   Eyes: Negative for blurred vision.  Respiratory: as per HPI  Cardiovascular: Negative for chest pain and palpitations.  Gastrointestinal: Negative for vomiting, diarrhea, blood per rectum. Genitourinary: Negative for dysuria, urgency, frequency and hematuria.  Musculoskeletal: Negative for myalgias, back pain and joint pain.  Skin: Negative for itching and rash.  Neurological: Negative for dizziness, tremors, focal weakness, seizures and loss of consciousness.  Endo/Heme/Allergies: Negative for environmental allergies.  Psychiatric/Behavioral: Negative for depression, suicidal ideas and hallucinations.  All other systems reviewed and are negative.  Physical Exam: Blood pressure 126/64, pulse 72, temperature (!) 97.4 F (36.3 C), temperature source Skin, height 5\' 8"  (1.727 m), weight 201 lb (91.2 kg), SpO2 98 %. Gen:      No acute distress HEENT:  EOMI, sclera anicteric Neck:     No masses; no thyromegaly Lungs:    Clear to auscultation bilaterally; normal respiratory effort CV:         Regular rate and rhythm; no murmurs Abd:      + bowel sounds; soft, non-tender; no palpable masses, no distension Ext:    No edema; adequate peripheral perfusion Skin:      Warm and dry; no rash Neuro: alert and oriented x 3 Psych: normal mood and affect  Data Reviewed: Imaging: High-res CT 05/07/2020-no ILD, mild air trapping, coronary artery calcification, enlarged pulmonary artery I have reviewed the images personally.  PFTs:  Labs: CBC 06/06/2018-WBC 5, eos 3.6%,  absolute eosinophil count 180 Alpha-1 antitrypsin 05/05/2020-129 Angiotensin-converting enzyme 05/05/2020-30 GBM antibody 05/05/2020-less than 1  Assessment:  Chronic cough May be related to upper airway cough syndrome, silent GERD CT reviewed with no interstitial lung disease.  He does have enlarged pulmonary arteries and mild air trapping and will need evaluation for pulmonary hypertension and reactive airways  Check PFTs, echocardiogram In the meantime we'll treat postnasal drip aggressively with chlorphentermine over-the-counter 8 mg 3 times daily, is continue Astelin nasal spray Start Prilosec 40 mg a day  Sleep apnea Untreated sleep apnea can present with cough.   Sleep apnea may be contributing to pulmonary hypertension as well He has not been on CPAP since 2010 after losing weight  Since he has gained back weight recently and is snoring again will reevaluate with home sleep study  Health maintenance Up-to-date with Covid and flu vaccination  Plan/Recommendations: PFTs, echocardiogram Home  sleep study Chlorphentermine 8 mg 3 times daily Prilosec 40 mg a day.  [name] MD [place] [facility] 05/14/2020, 9:40 AM  CC: Silverio Decamp,*

## 2020-05-17 ENCOUNTER — Other Ambulatory Visit: Payer: Medicare Other

## 2020-05-26 DIAGNOSIS — E119 Type 2 diabetes mellitus without complications: Secondary | ICD-10-CM

## 2020-05-28 ENCOUNTER — Encounter: Payer: Self-pay | Admitting: Sports Medicine

## 2020-05-28 ENCOUNTER — Other Ambulatory Visit: Payer: Self-pay

## 2020-05-28 ENCOUNTER — Ambulatory Visit (INDEPENDENT_AMBULATORY_CARE_PROVIDER_SITE_OTHER): Payer: Medicare Other | Admitting: Sports Medicine

## 2020-05-28 VITALS — BP 142/93 | HR 86 | Ht 68.0 in | Wt 195.0 lb

## 2020-05-28 DIAGNOSIS — R42 Dizziness and giddiness: Secondary | ICD-10-CM

## 2020-05-28 DIAGNOSIS — I2510 Atherosclerotic heart disease of native coronary artery without angina pectoris: Secondary | ICD-10-CM | POA: Diagnosis not present

## 2020-05-28 DIAGNOSIS — R0789 Other chest pain: Secondary | ICD-10-CM | POA: Diagnosis not present

## 2020-05-28 DIAGNOSIS — I2583 Coronary atherosclerosis due to lipid rich plaque: Secondary | ICD-10-CM

## 2020-05-28 DIAGNOSIS — R7989 Other specified abnormal findings of blood chemistry: Secondary | ICD-10-CM | POA: Diagnosis not present

## 2020-05-28 DIAGNOSIS — R079 Chest pain, unspecified: Secondary | ICD-10-CM

## 2020-05-28 DIAGNOSIS — R55 Syncope and collapse: Secondary | ICD-10-CM

## 2020-05-28 LAB — CBC
HCT: 38.7 % (ref 38.5–50.0)
Hemoglobin: 12.8 g/dL — ABNORMAL LOW (ref 13.2–17.1)
MCH: 30 pg (ref 27.0–33.0)
MCHC: 33.1 g/dL (ref 32.0–36.0)
MCV: 90.8 fL (ref 80.0–100.0)
MPV: 10.3 fL (ref 7.5–12.5)
Platelets: 238 10*3/uL (ref 140–400)
RBC: 4.26 10*6/uL (ref 4.20–5.80)
RDW: 13.3 % (ref 11.0–15.0)
WBC: 5 10*3/uL (ref 3.8–10.8)

## 2020-05-28 LAB — COMPREHENSIVE METABOLIC PANEL
AG Ratio: 1.4 (calc) (ref 1.0–2.5)
ALT: 11 U/L (ref 9–46)
AST: 14 U/L (ref 10–35)
Albumin: 4.5 g/dL (ref 3.6–5.1)
Alkaline phosphatase (APISO): 65 U/L (ref 35–144)
BUN/Creatinine Ratio: 13 (calc) (ref 6–22)
BUN: 19 mg/dL (ref 7–25)
CO2: 30 mmol/L (ref 20–32)
Calcium: 9.7 mg/dL (ref 8.6–10.3)
Chloride: 102 mmol/L (ref 98–110)
Creat: 1.46 mg/dL — ABNORMAL HIGH (ref 0.70–1.25)
Globulin: 3.2 g/dL (calc) (ref 1.9–3.7)
Glucose, Bld: 118 mg/dL — ABNORMAL HIGH (ref 65–99)
Potassium: 5.1 mmol/L (ref 3.5–5.3)
Sodium: 139 mmol/L (ref 135–146)
Total Bilirubin: 0.7 mg/dL (ref 0.2–1.2)
Total Protein: 7.7 g/dL (ref 6.1–8.1)

## 2020-05-28 LAB — CK TOTAL AND CKMB (NOT AT ARMC)
CK, MB: 0.9 ng/mL (ref 0–5.0)
Relative Index: 0.5 (ref 0–4.0)
Total CK: 181 U/L (ref 44–196)

## 2020-05-28 LAB — D-DIMER, QUANTITATIVE: D-Dimer, Quant: 0.95 mcg/mL FEU — ABNORMAL HIGH (ref <0.50)

## 2020-05-28 LAB — TROPONIN I: Troponin I: 5 ng/L (ref <=47)

## 2020-05-28 LAB — TSH: TSH: 0.87 mIU/L (ref 0.40–4.50)

## 2020-05-28 MED ORDER — ROSUVASTATIN CALCIUM 20 MG PO TABS: 20.0000 mg | ORAL_TABLET | Freq: Every day | ORAL | 3 refills | Status: DC

## 2020-05-28 NOTE — Assessment & Plan Note (Signed)
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 He was in the building working in a cool environment with long sleeves, he then went outside, it was a very hot day and started sweating significantly.  Over time he started to feel dizzy as if he was going to pass out, nauseated, and then developed a little bit of chest pain as above. He left work. Ultimately felt better. Symptoms then resolved, he does wear compression hose at work. I think he simply became orthostatic from prolonged standing, dehydrated. He will continue to stay hydrated with salt-containing beverages such as Gatorade and he is requesting a note to be out of work as he does desire to quit the job altogether and retire. I think this is acceptable.

## 2020-05-28 NOTE — Progress Notes (Addendum)
    Procedures performed today:    ECG performed and interpreted, normal sinus rhythm with a rate of 83 bpm, normal axis, normal PR interval, no ST abnormalities or QRS abnormalities.  Independent interpretation of notes and tests performed by another provider:   None.  Brief History, Exam, Impression, and Recommendations:    CAD (coronary artery disease) James Frye is a pleasant 69 year old male with history of coronary artery disease, he has not had a stress test in some time. 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 I do think this is predominantly due to orthostasis, see below, but we are going to go and get some labs and add a nuclear stress test considering his history. ECG was normal today. The symptoms do represent a potentially life-threatening process, we have made the decision not to hospitalize.  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 He was in the building working in a cool environment with long sleeves, he then went outside, it was a very hot day and started sweating significantly.  Over time he started to feel dizzy as if he was going to pass out, nauseated, and then developed a little bit of chest pain as above. He left work. Ultimately felt better. Symptoms then resolved, he does wear compression hose at work. I think he simply became orthostatic from prolonged standing, dehydrated. He will continue to stay hydrated with salt-containing beverages such as Gatorade and he is requesting a note to be out of work as he does desire to quit the job altogether and retire. I think this is acceptable.  Positive D dimer James Frye did have some episodes of chest pain, presyncope as noted below, ECG was normal, D-dimer did come back positive so we agreed to proceed with a stat CT angiogram of the  pulmonary arteries at [facility].    ___________________________________________ [name]. Dianah Field, M.D., ABFM., CAQSM. Primary Care and [place] Instructor of [place] [facility]

## 2020-05-28 NOTE — Assessment & Plan Note (Signed)
James Frye did have some episodes of chest pain, presyncope as noted below, ECG was normal, D-dimer did come back positive so we agreed to proceed with a stat CT angiogram of the pulmonary arteries at [facility].

## 2020-05-28 NOTE — Addendum Note (Signed)
Addended by: Silverio Decamp on: 05/28/2020 04:24 PM   Modules accepted: Orders

## 2020-05-28 NOTE — Assessment & Plan Note (Addendum)
James Frye is a pleasant 69 year old male with history of coronary artery disease, he has not had a stress test in some time. 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 I do think this is predominantly due to orthostasis, see below, but we are going to go and get some labs and add a nuclear stress test considering his history. ECG was normal today. The symptoms do represent a potentially life-threatening process, we have made the decision not to hospitalize.

## 2020-05-31 ENCOUNTER — Ambulatory Visit (HOSPITAL_BASED_OUTPATIENT_CLINIC_OR_DEPARTMENT_OTHER)
Admission: RE | Admit: 2020-05-31 | Discharge: 2020-05-31 | Disposition: A | Discharge status: Home / Self Care | Payer: Medicare Other | Source: Ambulatory Visit | Attending: Sports Medicine | Admitting: Sports Medicine

## 2020-05-31 ENCOUNTER — Other Ambulatory Visit: Payer: Self-pay

## 2020-05-31 DIAGNOSIS — I700 Atherosclerosis of aorta: Secondary | ICD-10-CM | POA: Diagnosis not present

## 2020-05-31 DIAGNOSIS — R55 Syncope and collapse: Secondary | ICD-10-CM | POA: Diagnosis not present

## 2020-05-31 DIAGNOSIS — R7989 Other specified abnormal findings of blood chemistry: Secondary | ICD-10-CM | POA: Diagnosis present

## 2020-05-31 DIAGNOSIS — I2510 Atherosclerotic heart disease of native coronary artery without angina pectoris: Secondary | ICD-10-CM | POA: Diagnosis not present

## 2020-05-31 DIAGNOSIS — R079 Chest pain, unspecified: Secondary | ICD-10-CM | POA: Diagnosis present

## 2020-05-31 MED ORDER — IOHEXOL 350 MG/ML SOLN
100.0000 mL | Freq: Once | INTRAVENOUS | Status: AC | PRN
  Administered 2020-05-31: 100 mL via INTRAVENOUS

## 2020-06-09 ENCOUNTER — Other Ambulatory Visit: Payer: Self-pay | Admitting: Sports Medicine

## 2020-06-09 DIAGNOSIS — I2510 Atherosclerotic heart disease of native coronary artery without angina pectoris: Secondary | ICD-10-CM

## 2020-06-11 ENCOUNTER — Other Ambulatory Visit: Payer: Self-pay

## 2020-06-11 ENCOUNTER — Ambulatory Visit (HOSPITAL_COMMUNITY): Payer: Medicare Other | Attending: Cardiology

## 2020-06-11 DIAGNOSIS — I272 Pulmonary hypertension, unspecified: Secondary | ICD-10-CM | POA: Insufficient documentation

## 2020-06-11 LAB — ECHOCARDIOGRAM COMPLETE
Area-P 1/2: 3.31 cm2
S' Lateral: 2.2 cm

## 2020-06-11 MED ORDER — PERFLUTREN LIPID MICROSPHERE
1.0000 mL | INTRAVENOUS | Status: AC | PRN
  Administered 2020-06-11: 2 mL via INTRAVENOUS

## 2020-06-14 ENCOUNTER — Telehealth (HOSPITAL_COMMUNITY): Payer: Self-pay | Admitting: *Deleted

## 2020-06-14 NOTE — Telephone Encounter (Signed)
Patient given detailed instructions per Myocardial Perfusion Study Information Sheet for the test on 06/16/20. Patient notified to arrive 15 minutes early and that it is imperative to arrive on time for appointment to keep from having the test rescheduled.  If you need to cancel or reschedule your appointment, please call the office within 24 hours of your appointment. . Patient verbalized understanding. Kirstie Peri

## 2020-06-16 ENCOUNTER — Ambulatory Visit (HOSPITAL_COMMUNITY): Payer: Medicare Other | Attending: Cardiology

## 2020-06-16 ENCOUNTER — Other Ambulatory Visit: Payer: Self-pay

## 2020-06-16 VITALS — Ht 68.0 in | Wt 195.0 lb

## 2020-06-16 DIAGNOSIS — I2583 Coronary atherosclerosis due to lipid rich plaque: Secondary | ICD-10-CM

## 2020-06-16 DIAGNOSIS — I2510 Atherosclerotic heart disease of native coronary artery without angina pectoris: Secondary | ICD-10-CM | POA: Insufficient documentation

## 2020-06-16 LAB — MYOCARDIAL PERFUSION IMAGING
LV dias vol: 70 mL (ref 62–150)
LV sys vol: 25 mL
Peak HR: 93 {beats}/min
Rest HR: 72 {beats}/min
SDS: 0
SRS: 0
SSS: 0
TID: 1.1

## 2020-06-16 MED ORDER — TECHNETIUM TC 99M TETROFOSMIN IV KIT
32.4000 | PACK | Freq: Once | INTRAVENOUS | Status: AC | PRN
  Administered 2020-06-16: 32.4 via INTRAVENOUS
  Filled 2020-06-16: qty 33

## 2020-06-16 MED ORDER — TECHNETIUM TC 99M TETROFOSMIN IV KIT
11.0000 | PACK | Freq: Once | INTRAVENOUS | Status: AC | PRN
  Administered 2020-06-16: 11 via INTRAVENOUS
  Filled 2020-06-16: qty 11

## 2020-06-16 MED ORDER — REGADENOSON 0.4 MG/5ML IV SOLN
0.4000 mg | Freq: Once | INTRAVENOUS | Status: AC
  Administered 2020-06-16: 0.4 mg via INTRAVENOUS

## 2020-06-17 ENCOUNTER — Ambulatory Visit: Payer: Medicare Other | Admitting: Pulmonary Disease

## 2020-06-17 ENCOUNTER — Encounter: Payer: Self-pay | Admitting: Pulmonary Disease

## 2020-06-17 ENCOUNTER — Ambulatory Visit (INDEPENDENT_AMBULATORY_CARE_PROVIDER_SITE_OTHER): Payer: Medicare Other | Admitting: Pulmonary Disease

## 2020-06-17 VITALS — BP 138/78 | HR 89 | Temp 97.5°F | Ht 68.0 in | Wt 197.8 lb

## 2020-06-17 DIAGNOSIS — R059 Cough, unspecified: Secondary | ICD-10-CM

## 2020-06-17 DIAGNOSIS — G4733 Obstructive sleep apnea (adult) (pediatric): Secondary | ICD-10-CM

## 2020-06-17 LAB — PULMONARY FUNCTION TEST
DL/VA % pred: 108 %
DL/VA: 4.5 ml/min/mmHg/L
DLCO cor % pred: 98 %
DLCO cor: 24.36 ml/min/mmHg
DLCO unc % pred: 92 %
DLCO unc: 23.03 ml/min/mmHg
FEF 25-75 Post: 3.5 L/sec
FEF 25-75 Pre: 3.05 L/sec
FEF2575-%Change-Post: 14 %
FEF2575-%Pred-Post: 143 %
FEF2575-%Pred-Pre: 125 %
FEV1-%Change-Post: 6 %
FEV1-%Pred-Post: 119 %
FEV1-%Pred-Pre: 112 %
FEV1-Post: 3.27 L
FEV1-Pre: 3.08 L
FEV1FVC-%Change-Post: 2 %
FEV1FVC-%Pred-Pre: 105 %
FEV6-%Change-Post: 3 %
FEV6-%Pred-Post: 114 %
FEV6-%Pred-Pre: 110 %
FEV6-Post: 3.91 L
FEV6-Pre: 3.78 L
FEV6FVC-%Change-Post: 0 %
FEV6FVC-%Pred-Post: 104 %
FEV6FVC-%Pred-Pre: 104 %
FVC-%Change-Post: 3 %
FVC-%Pred-Post: 108 %
FVC-%Pred-Pre: 105 %
FVC-Post: 3.91 L
FVC-Pre: 3.79 L
Post FEV1/FVC ratio: 83 %
Post FEV6/FVC ratio: 100 %
Pre FEV1/FVC ratio: 81 %
Pre FEV6/FVC Ratio: 100 %
RV % pred: 117 %
RV: 2.68 L
TLC % pred: 101 %
TLC: 6.72 L

## 2020-06-17 NOTE — Addendum Note (Signed)
Addended by: Gavin Potters R on: 06/17/2020 10:38 AM   Modules accepted: Orders

## 2020-06-17 NOTE — Patient Instructions (Signed)
I have reviewed your PFTs which show normal lung function Your CT scan and echocardiogram looks okay as well which is good news  We will start you on Flonase nasal spray We will order chlorpheniramine 8 mg 3 times daily.  This medication is available over-the-counter as well Increase Prilosec to 40 mg twice daily We will reorder the home sleep study.  Follow-up in 3 months.

## 2020-06-17 NOTE — Progress Notes (Signed)
PFT done today. 

## 2020-06-17 NOTE — Progress Notes (Signed)
James Frye    [id number]    [birth year]  Primary Care Physician:Frye, James Her, MD  Referring Physician: Silverio Frye, [place],  James Frye  Chief complaint: Consult for chronic cough  HPI: 69 year old with history of diabetes, postnasal drip, allergic rhinitis  Complains of chronic cough 2 to 3 months, cough is nonproductive in nature, occurs more at night and on lying down Initially started on Flonase and narcotic cough suppressant with little improvement Therapy changed to chlorpheniramine DM cough syrup, Astelin in August by his by James Frye, primary care without any improvement and hence referred to pulmonary.  He had a high-res CT in late September 2021 with no evidence of interstitial lung disease, possible pulmonary hypertension.  Lab work including alpha-1 antitrypsin, ACE, GBM antibodies, QuantiFERON are negative.  Patient has history of GERD and sleep apnea which improved after he got a gastric bypass in 2010.  He took himself off CPAP Has about 20 pound weight gain over the past few years during the Covid pandemic and his wife tells him that he is snoring again though not as bad as before  Pets: No pets Occupation: Used to work in [employer].  Currently retired Exposures: No known exposures.  No mold, hot tub, Jacuzzi.  No feather pillows or comforters Smoking history: Used to smoke as a teenager Travel history: No significant travel history Relevant family history: Mother had amyloidosis affecting the lung  Outpatient Encounter Medications as of 06/17/2020  Medication Sig  . azelastine (ASTELIN) 0.1 % nasal spray Place 2 sprays into both nostrils 2 (two) times daily. Use in each nostril as directed  . Chlorpheniramine-DM 2-15 MG/5ML LIQD Take 10-15 mLs by mouth at bedtime.  . clobetasol ointment (TEMOVATE) 4.58 % Apply 1 application topically 2 (two) times daily.  . hydrOXYzine (ATARAX/VISTARIL) 50  MG tablet Take 1 tablet (50 mg total) by mouth at bedtime.  . metFORMIN (GLUCOPHAGE) 1000 MG tablet Take 1 tablet by mouth once daily with breakfast  . omeprazole (PRILOSEC) 40 MG capsule Take 1 capsule (40 mg total) by mouth daily.  . rosuvastatin (CRESTOR) 20 MG tablet Take 1 tablet (20 mg total) by mouth daily.   No facility-administered encounter medications on file as of 06/17/2020.    Allergies as of 06/17/2020 - Review Complete 06/17/2020  Allergen Reaction Noted  . Penicillins Rash 07/04/2012    Past Medical History:  Diagnosis Date  . CAD (coronary artery disease)   . Diabetes mellitus   . Hiatal hernia     Past Surgical History:  Procedure Laterality Date  . APPENDECTOMY    . GASTRIC BYPASS      Family History  Problem Relation Age of Onset  . Cancer Mother   . Stroke Father   . Heart disease Father     Social History   Socioeconomic History  . Marital status: Married    Spouse name: Not on file  . Number of children: 3  . Years of education: Not on file  . Highest education level: Not on file  Occupational History  . Not on file  Tobacco Use  . Smoking status: Former Research scientist (life sciences)  . Smokeless tobacco: Never Used  Substance and Sexual Activity  . Alcohol use: No  . Drug use: No  . Sexual activity: Yes  Other Topics Concern  . Not on file  Social History Narrative  . Not on file   Social Determinants  of Health   Financial Resource Strain:   . Difficulty of Paying Living Expenses: Not on file  Food Insecurity:   . Worried About Charity fundraiser in the Last Year: Not on file  . Ran Out of Food in the Last Year: Not on file  Transportation Needs:   . Lack of Transportation (Medical): Not on file  . Lack of Transportation (Non-Medical): Not on file  Physical Activity:   . Days of Exercise per Week: Not on file  . Minutes of Exercise per Session: Not on file  Stress:   . Feeling of Stress : Not on file  Social Connections:   . Frequency of  Communication with Friends and Family: Not on file  . Frequency of Social Gatherings with Friends and Family: Not on file  . Attends Religious Services: Not on file  . Active Member of Clubs or Organizations: Not on file  . Attends Archivist Meetings: Not on file  . Marital Status: Not on file  Intimate Partner Violence:   . Fear of Current or Ex-Partner: Not on file  . Emotionally Abused: Not on file  . Physically Abused: Not on file  . Sexually Abused: Not on file    Review of systems: Review of Systems  Constitutional: Negative for fever and chills.  HENT: Negative.   Eyes: Negative for blurred vision.  Respiratory: as per HPI  Cardiovascular: Negative for chest pain and palpitations.  Gastrointestinal: Negative for vomiting, diarrhea, blood per rectum. Genitourinary: Negative for dysuria, urgency, frequency and hematuria.  Musculoskeletal: Negative for myalgias, back pain and joint pain.  Skin: Negative for itching and rash.  Neurological: Negative for dizziness, tremors, focal weakness, seizures and loss of consciousness.  Endo/Heme/Allergies: Negative for environmental allergies.  Psychiatric/Behavioral: Negative for depression, suicidal ideas and hallucinations.  All other systems reviewed and are negative.  Physical Exam: Blood pressure 126/64, pulse 72, temperature (!) 97.4 F (36.3 C), temperature source Skin, height 5\' 8"  (1.727 m), weight 201 lb (91.2 kg), SpO2 98 %. Gen:      No acute distress HEENT:  EOMI, sclera anicteric Neck:     No masses; no thyromegaly Lungs:    Clear to auscultation bilaterally; normal respiratory effort CV:         Regular rate and rhythm; no murmurs Abd:      + bowel sounds; soft, non-tender; no palpable masses, no distension Ext:    No edema; adequate peripheral perfusion Skin:      Warm and dry; no rash Neuro: alert and oriented x 3 Psych: normal mood and affect  Data Reviewed: Imaging: High-res CT 05/07/2020-no ILD,  mild air trapping, coronary artery calcification, enlarged pulmonary artery I have reviewed the images personally.  PFTs: 06/17/2020 FVC 3.91 [108%], FEV1 3.27 [119%], F/F 83, TLC 6.72 [101%], DLCO 23.03 [92%] Normal test  Labs: CBC 06/06/2018-WBC 5, eos 3.6%, absolute eosinophil count 180 Alpha-1 antitrypsin 05/05/2020-129 Angiotensin-converting enzyme 05/05/2020-30 GBM antibody 05/05/2020-less than 1  Cardiac: Echo 06/11/2020 LVEF 55-60%, mild LVH, grade 1 diastolic dysfunction, normal RV systolic function and size.  Inadequate TR to assess PA pressure  Assessment:  Chronic cough May be related to upper airway cough syndrome, silent GERD CT reviewed with no interstitial lung disease and PFTs are normal.    Reassured the patient We will treat postnasal drip aggressively with chlorphentermine over-the-counter 8 mg 3 times daily, is continue Astelin nasal spray.  Add Flonase nasal spray  GERD Increase Prilosec to 40 mg twice daily  Enlarged PA Noted on CT scan however no pulmonary hypertension noted on echocardiogram.  Sleep apnea Untreated sleep apnea can present with cough.   Sleep apnea may be contributing to pulmonary hypertension as well He has not been on CPAP since 2010 after losing weight  Since he has gained back weight recently and is snoring again will reevaluate with home sleep study  Health maintenance Up-to-date with Covid and flu vaccination  Plan/Recommendations: Home sleep study Chlorphentermine 8 mg 3 times daily Prilosec 40 mg bid  [name] MD [facility] 06/17/2020, 10:13 AM  CC: James Frye,*

## 2020-06-23 ENCOUNTER — Encounter: Payer: Self-pay | Admitting: Pulmonary Disease

## 2020-07-02 ENCOUNTER — Other Ambulatory Visit: Payer: Self-pay

## 2020-07-02 ENCOUNTER — Ambulatory Visit: Payer: Medicare Other

## 2020-07-02 DIAGNOSIS — G4733 Obstructive sleep apnea (adult) (pediatric): Secondary | ICD-10-CM

## 2020-07-14 DIAGNOSIS — G4733 Obstructive sleep apnea (adult) (pediatric): Secondary | ICD-10-CM | POA: Diagnosis not present

## 2020-08-17 ENCOUNTER — Encounter: Payer: Self-pay | Admitting: Pulmonary Disease

## 2020-08-17 NOTE — Progress Notes (Unsigned)
Sleep study 11/21 does confirm sleep apnea Please inform patient and order auto CPAP 5-20 mm  Chilton Greathouse MD [facility] 08/17/2020, 9:22 AM

## 2020-08-18 ENCOUNTER — Telehealth: Payer: Self-pay | Admitting: Pulmonary Disease

## 2020-08-18 NOTE — Telephone Encounter (Signed)
ATC Patient.  LM for Patient to call office for sleep study results.    Sleep study 11/21 does confirm sleep apnea Please inform patient and order auto CPAP 5-20 mm  Chilton Greathouse MD [facility] 08/17/2020, 9:22 AM

## 2020-08-19 NOTE — Telephone Encounter (Signed)
ATC Patient to give sleep results.  LM for Patient to call office for results.  Message left on mobile number.  ATC home number listed x's 2 and received busy signal both times.

## 2020-08-20 ENCOUNTER — Encounter: Payer: Self-pay | Admitting: *Deleted

## 2020-08-20 NOTE — Telephone Encounter (Signed)
ATC Patient to give sleep results.  LM to call back when available. Letter printed and placed in out going mail for Patient to call office to receive results.

## 2020-08-26 ENCOUNTER — Telehealth: Payer: Self-pay | Admitting: Pulmonary Disease

## 2020-08-26 DIAGNOSIS — G4733 Obstructive sleep apnea (adult) (pediatric): Secondary | ICD-10-CM

## 2020-08-26 NOTE — Telephone Encounter (Signed)
Called and spoke with pt letting him know the results of the HST and asked him if he was okay with [place] ordering CPAP now or if wanted to further discuss this when he comes for f/u OV 1/26. Pt stated he was okay with [place] ordering CPAP so order has been placed. Nothing further needed.

## 2020-09-07 ENCOUNTER — Other Ambulatory Visit: Payer: Self-pay | Admitting: Sports Medicine

## 2020-09-07 DIAGNOSIS — F5101 Primary insomnia: Secondary | ICD-10-CM

## 2020-09-07 MED ORDER — HYDROXYZINE HCL 50 MG PO TABS: 50.0000 mg | ORAL_TABLET | Freq: Every day | ORAL | 3 refills | Status: DC

## 2020-09-08 ENCOUNTER — Ambulatory Visit: Payer: Medicare Other | Admitting: Pulmonary Disease

## 2020-09-08 ENCOUNTER — Encounter: Payer: Self-pay | Admitting: Pulmonary Disease

## 2020-09-08 ENCOUNTER — Other Ambulatory Visit: Payer: Self-pay

## 2020-09-08 VITALS — BP 138/64 | HR 73 | Temp 97.6°F | Ht 67.0 in | Wt 195.0 lb

## 2020-09-08 DIAGNOSIS — G4733 Obstructive sleep apnea (adult) (pediatric): Secondary | ICD-10-CM | POA: Diagnosis not present

## 2020-09-08 DIAGNOSIS — R059 Cough, unspecified: Secondary | ICD-10-CM | POA: Diagnosis not present

## 2020-09-08 LAB — CBC WITH DIFFERENTIAL/PLATELET
Basophils Absolute: 0 10*3/uL (ref 0.0–0.1)
Basophils Relative: 0.5 % (ref 0.0–3.0)
Eosinophils Absolute: 0.2 10*3/uL (ref 0.0–0.7)
Eosinophils Relative: 4.3 % (ref 0.0–5.0)
HCT: 34.9 % — ABNORMAL LOW (ref 39.0–52.0)
Hemoglobin: 11.6 g/dL — ABNORMAL LOW (ref 13.0–17.0)
Lymphocytes Relative: 20.5 % (ref 12.0–46.0)
Lymphs Abs: 1.1 10*3/uL (ref 0.7–4.0)
MCHC: 33.2 g/dL (ref 30.0–36.0)
MCV: 89.6 fl (ref 78.0–100.0)
Monocytes Absolute: 0.5 10*3/uL (ref 0.1–1.0)
Monocytes Relative: 8.3 % (ref 3.0–12.0)
Neutro Abs: 3.6 10*3/uL (ref 1.4–7.7)
Neutrophils Relative %: 66.4 % (ref 43.0–77.0)
Platelets: 212 10*3/uL (ref 150.0–400.0)
RBC: 3.89 Mil/uL — ABNORMAL LOW (ref 4.22–5.81)
RDW: 14.4 % (ref 11.5–15.5)
WBC: 5.4 10*3/uL (ref 4.0–10.5)

## 2020-09-08 MED ORDER — PREDNISONE 20 MG PO TABS: ORAL_TABLET | ORAL | 0 refills | Status: DC

## 2020-09-08 NOTE — Patient Instructions (Signed)
We will start you on prednisone 40 mg a day for 7 days to see if that will improve the cough Continue the antiacid medications and antiallergy medications We are awaiting initiation of CPAP for treatment of sleep apnea  Check CBC differential, IgE today  Follow-up in 2 months.

## 2020-09-08 NOTE — Progress Notes (Signed)
James Frye    [id number]    04-19-1951  Primary Care Physician:[name], [name], MD  Referring Physician: Silverio Decamp, [facility] [address] [place],  [place] [address]  Chief complaint: Follow up for chronic cough  HPI: 70 year old with history of diabetes, postnasal drip, allergic rhinitis  Complains of chronic cough 2 to 3 months, cough is nonproductive in nature, occurs more at night and on lying down Initially started on Flonase and narcotic cough suppressant with little improvement Therapy changed to chlorpheniramine DM cough syrup, Astelin in August by his by Dr. Dianah Field, primary care without any improvement and hence referred to pulmonary.  He had a high-res CT in late September 2021 with no evidence of interstitial lung disease, possible pulmonary hypertension.  Lab work including alpha-1 antitrypsin, ACE, GBM antibodies, QuantiFERON are negative.  Patient has history of GERD and sleep apnea which improved after he got a gastric bypass in 2010.  He took himself off CPAP Has about 20 pound weight gain over the past few years during the Covid pandemic and his wife tells him that he is snoring again though not as bad as before  Pets: No pets Occupation: Used to work in [employer].  Currently retired Exposures: No known exposures.  No mold, hot tub, Jacuzzi.  No feather pillows or comforters Smoking history: Used to smoke as a teenager Travel history: No significant travel history Relevant family history: Mother had amyloidosis affecting the lung  Interim history: Continues to have cough with clear white mucus.  Increased on laying down He is on PPI twice daily with no improvement in symptoms.  He is also on Flonase, antihistamine which chlorpheniramine  Home sleep study showed sleep apnea and he is awaiting initiation of CPAP later this month.  Outpatient Encounter Medications as of 09/08/2020  Medication Sig  . azelastine (ASTELIN)  0.1 % nasal spray Place 2 sprays into both nostrils 2 (two) times daily. Use in each nostril as directed  . clobetasol ointment (TEMOVATE) 4.31 % Apply 1 application topically 2 (two) times daily.  . fluticasone (FLONASE) 50 MCG/ACT nasal spray Place 2 sprays into both nostrils daily.  . hydrOXYzine (ATARAX/VISTARIL) 50 MG tablet Take 1 tablet (50 mg total) by mouth at bedtime.  . metFORMIN (GLUCOPHAGE) 1000 MG tablet Take 1 tablet by mouth once daily with breakfast  . omeprazole (PRILOSEC) 40 MG capsule Take 1 capsule (40 mg total) by mouth daily.  . rosuvastatin (CRESTOR) 20 MG tablet Take 1 tablet (20 mg total) by mouth daily.  . [DISCONTINUED] chlorpheniramine (CHLOR-TRIMETON) 4 MG tablet Take 8 mg by mouth in the morning, at noon, and at bedtime.  . [DISCONTINUED] Chlorpheniramine-DM 2-15 MG/5ML LIQD Take 10-15 mLs by mouth at bedtime.  . [DISCONTINUED] hydrOXYzine (ATARAX/VISTARIL) 50 MG tablet Take 1 tablet (50 mg total) by mouth at bedtime.   No facility-administered encounter medications on file as of 09/08/2020.   Physical Exam: Blood pressure 138/64, pulse 73, temperature 97.6 F (36.4 C), temperature source Skin, height 5\' 7"  (1.702 m), weight 195 lb (88.5 kg), SpO2 99 %. Gen:      No acute distress HEENT:  EOMI, sclera anicteric Neck:     No masses; no thyromegaly Lungs:    Clear to auscultation bilaterally; normal respiratory effort CV:         Regular rate and rhythm; no murmurs Abd:      + bowel sounds; soft, non-tender; no palpable masses, no  distension Ext:    No edema; adequate peripheral perfusion Skin:      Warm and dry; no rash Neuro: alert and oriented x 3 Psych: normal mood and affect  Data Reviewed: Imaging: High-res CT 05/07/2020-no ILD, mild air trapping, coronary artery calcification, enlarged pulmonary artery I have reviewed the images personally.  PFTs: 06/17/2020 FVC 3.91 [108%], FEV1 3.27 [119%], F/F 83, TLC 6.72 [101%], DLCO 23.03 [92%] Normal  test  Labs: CBC 06/06/2018-WBC 5, eos 3.6%, absolute eosinophil count 180 Alpha-1 antitrypsin 05/05/2020-129 Angiotensin-converting enzyme 05/05/2020-30 GBM antibody 05/05/2020-less than 1  Cardiac: Echo 06/11/2020 LVEF 55-60%, mild LVH, grade 1 diastolic dysfunction, normal RV systolic function and size.  Inadequate TR to assess PA pressure  Sleep: Sleep study 11/21 Severe complex sleep apnea with central events.  AHI 32.3, desats to 83%.  Assessment:  Chronic cough May be related to upper airway cough syndrome, silent GERD given increased symptoms when laying down and at night CT reviewed with no interstitial lung disease and PFTs are normal.    Reassured the patient Continue Flonase, Astelin, chlorpheniramine for postnasal drip  Trial of prednisone for 1 week to see if it improves cough.  If it improves cough then will start inhalers for diagnosis of reactive airway disease, asthma Check CBC differential, IgE  GERD Continue Prilosec 40 mg twice daily  Enlarged PA Noted on CT scan however no pulmonary hypertension noted on echocardiogram.  Sleep apnea Untreated sleep apnea can present with cough.   Sleep apnea may be contributing to pulmonary hypertension as well He has not been on CPAP since 2010 after losing weight  Reviewed recent sleep study with patient.  AutoSet CPAP has been ordered and is due to be delivered later this month Reassess at return visit.  Health maintenance Up-to-date with Covid and flu vaccination  Plan/Recommendations: CPAP initiation Treatment for postnasal drip, GERD Prednisone 40 mg a day for 7 days CBC with differential, IgE  [name] MD [facility] 09/08/2020, 8:56 AM  CC: Silverio Decamp,*

## 2020-09-08 NOTE — Addendum Note (Signed)
Addended by: Suzzanne Cloud E on: 09/08/2020 09:14 AM   Modules accepted: Orders

## 2020-09-08 NOTE — Addendum Note (Signed)
Addended by: Elton Sin on: 09/08/2020 09:13 AM   Modules accepted: Orders

## 2020-09-09 LAB — IGE: IgE (Immunoglobulin E), Serum: 67 kU/L (ref <=114)

## 2020-09-10 DIAGNOSIS — G4733 Obstructive sleep apnea (adult) (pediatric): Secondary | ICD-10-CM | POA: Diagnosis not present

## 2020-09-10 DIAGNOSIS — R0681 Apnea, not elsewhere classified: Secondary | ICD-10-CM | POA: Diagnosis not present

## 2020-09-15 DIAGNOSIS — R0681 Apnea, not elsewhere classified: Secondary | ICD-10-CM | POA: Diagnosis not present

## 2020-09-15 DIAGNOSIS — G4733 Obstructive sleep apnea (adult) (pediatric): Secondary | ICD-10-CM | POA: Diagnosis not present

## 2020-09-24 ENCOUNTER — Other Ambulatory Visit: Payer: Self-pay

## 2020-09-24 ENCOUNTER — Encounter: Payer: Self-pay | Admitting: Sports Medicine

## 2020-09-24 ENCOUNTER — Ambulatory Visit (INDEPENDENT_AMBULATORY_CARE_PROVIDER_SITE_OTHER): Payer: Medicare Other | Admitting: Sports Medicine

## 2020-09-24 VITALS — BP 128/74 | HR 68 | Ht 67.0 in | Wt 197.0 lb

## 2020-09-24 DIAGNOSIS — N401 Enlarged prostate with lower urinary tract symptoms: Secondary | ICD-10-CM | POA: Diagnosis not present

## 2020-09-24 DIAGNOSIS — R053 Chronic cough: Secondary | ICD-10-CM

## 2020-09-24 DIAGNOSIS — E119 Type 2 diabetes mellitus without complications: Secondary | ICD-10-CM

## 2020-09-24 DIAGNOSIS — Z0000 Encounter for general adult medical examination without abnormal findings: Secondary | ICD-10-CM | POA: Diagnosis not present

## 2020-09-24 LAB — POCT UA - MICROALBUMIN
Creatinine, POC: 200 mg/dL
Microalbumin Ur, POC: 80 mg/L

## 2020-09-24 MED ORDER — IPRATROPIUM BROMIDE 0.06 % NA SOLN
2.0000 | Freq: Three times a day (TID) | NASAL | 12 refills | Status: DC
Start: 2020-09-24 — End: 2021-09-29

## 2020-09-24 MED ORDER — UMECLIDINIUM-VILANTEROL 62.5-25 MCG/INH IN AEPB: 1.0000 | INHALATION_SPRAY | Freq: Every day | RESPIRATORY_TRACT | 11 refills | Status: DC

## 2020-09-24 NOTE — Assessment & Plan Note (Addendum)
Adding routine labs, he did have an elevated microalbumin creatinine ratio. He will improve his diet and hydration and at the follow-up visit in a month we can recheck this.   If it normalizes we will hold off on adding ACE/ARB particularly since he has a chronic dry cough already.

## 2020-09-24 NOTE — Progress Notes (Signed)
Subjective:    CC: Annual Physical Exam  HPI:  This patient is here for their annual physical  I reviewed the past medical history, family history, social history, surgical history, and allergies today and no changes were needed.  Please see the problem list section below in epic for further details.  Past Medical History: Past Medical History:  Diagnosis Date  . CAD (coronary artery disease)   . Diabetes mellitus   . Hiatal hernia    Past Surgical History: Past Surgical History:  Procedure Laterality Date  . APPENDECTOMY    . GASTRIC BYPASS     Social History: Social History   Socioeconomic History  . Marital status: Married    Spouse name: Not on file  . Number of children: 3  . Years of education: Not on file  . Highest education level: Not on file  Occupational History  . Not on file  Tobacco Use  . Smoking status: Former Research scientist (life sciences)  . Smokeless tobacco: Never Used  Substance and Sexual Activity  . Alcohol use: No  . Drug use: No  . Sexual activity: Yes  Other Topics Concern  . Not on file  Social History Narrative  . Not on file   Social Determinants of Health   Financial Resource Strain: Not on file  Food Insecurity: Not on file  Transportation Needs: Not on file  Physical Activity: Not on file  Stress: Not on file  Social Connections: Not on file   Family History: Family History  Problem Relation Age of Onset  . Cancer Mother   . Stroke Father   . Heart disease Father    Allergies: Allergies  Allergen Reactions  . Penicillins Rash   Medications: See med rec.  Review of Systems: No headache, visual changes, nausea, vomiting, diarrhea, constipation, dizziness, abdominal pain, skin rash, fevers, chills, night sweats, swollen lymph nodes, weight loss, chest pain, body aches, joint swelling, muscle aches, shortness of breath, mood changes, visual or auditory hallucinations.  Objective:    General: Well Developed, well nourished, and in no acute  distress.  Neuro: Alert and oriented x3, extra-ocular muscles intact, sensation grossly intact. Cranial nerves II through XII are intact, motor, sensory, and coordinative functions are all intact. HEENT: Normocephalic, atraumatic, pupils equal round reactive to light, neck supple, no masses, no lymphadenopathy, thyroid nonpalpable. Oropharynx, nasopharynx, external ear canals are unremarkable. Skin: Warm and dry, no rashes noted.  Cardiac: Regular rate and rhythm, no murmurs rubs or gallops.  Respiratory: Clear to auscultation bilaterally. Not using accessory muscles, speaking in full sentences.  Abdominal: Soft, nontender, nondistended, positive bowel sounds, no masses, no organomegaly.  Musculoskeletal: Shoulder, elbow, wrist, hip, knee, ankle stable, and with full range of motion.  Impression and Recommendations:    The patient was counselled, risk factors were discussed, anticipatory guidance given.  Annual physical exam Annual physical as above.   Chronic cough Unclear etiology, he has been seeing a pulmonologist, Flonase has not been effective, discontinue this, azelastine not effective. Chlorpheniramine/dextromethorphan not sufficiently effective, pre and postbronchodilator spirometry, unrevealing, high-resolution chest CT, testing for tuberculosis, Goodpasture syndrome, alpha-1 antitrypsin deficiency, vasculitides, sarcoidosis, all unrevealing. No improvement yet with pulmonology visits. He is on CPAP which has not helped significantly. We are going to add nasal Atrovent and start Anoro, he does report that the cough may be worsens when getting out of the shower.  I hope a long-acting bronchodilator and muscarinic antagonist component will be helpful.   Diabetes mellitus type 2, controlled Adding routine  labs, he did have an elevated microalbumin creatinine ratio. He will improve his diet and hydration and at the follow-up visit in a month we can recheck this.   If it normalizes  we will hold off on adding ACE/ARB particularly since he has a chronic dry cough already.  Benign prostate hyperplasia Checking PSA.   ___________________________________________ [name]. Dianah Field, M.D., ABFM., CAQSM. Primary Care and Sports Medicine [facility]  Adjunct Professor of [place] [employer] [facility] [employer] [employer] [facility] [employer] [facility] [employer] [employer]

## 2020-09-24 NOTE — Assessment & Plan Note (Signed)
Unclear etiology, he has been seeing a pulmonologist, Flonase has not been effective, discontinue this, azelastine not effective. Chlorpheniramine/dextromethorphan not sufficiently effective, pre and postbronchodilator spirometry, unrevealing, high-resolution chest CT, testing for tuberculosis, Goodpasture syndrome, alpha-1 antitrypsin deficiency, vasculitides, sarcoidosis, all unrevealing. No improvement yet with pulmonology visits. He is on CPAP which has not helped significantly. We are going to add nasal Atrovent and start Anoro, he does report that the cough may be worsens when getting out of the shower.  I hope a long-acting bronchodilator and muscarinic antagonist component will be helpful.

## 2020-09-24 NOTE — Assessment & Plan Note (Signed)
Annual physical as above.  

## 2020-09-24 NOTE — Assessment & Plan Note (Signed)
Checking PSA 

## 2020-09-25 DIAGNOSIS — R053 Chronic cough: Secondary | ICD-10-CM

## 2020-09-27 LAB — CBC
HCT: 37 % — ABNORMAL LOW (ref 38.5–50.0)
Hemoglobin: 12.1 g/dL — ABNORMAL LOW (ref 13.2–17.1)
MCH: 29.6 pg (ref 27.0–33.0)
MCHC: 32.7 g/dL (ref 32.0–36.0)
MCV: 90.5 fL (ref 80.0–100.0)
MPV: 10.3 fL (ref 7.5–12.5)
Platelets: 268 10*3/uL (ref 140–400)
RBC: 4.09 10*6/uL — ABNORMAL LOW (ref 4.20–5.80)
RDW: 13.3 % (ref 11.0–15.0)
WBC: 10.5 10*3/uL (ref 3.8–10.8)

## 2020-09-27 LAB — PSA, TOTAL AND FREE
PSA, % Free: 33 % (calc) (ref >25)
PSA, Free: 0.9 ng/mL
PSA, Total: 2.7 ng/mL (ref <=4.0)

## 2020-09-27 LAB — HEMOGLOBIN A1C
Hgb A1c MFr Bld: 6.3 % of total Hgb — ABNORMAL HIGH (ref <5.7)
Mean Plasma Glucose: 134 mg/dL
eAG (mmol/L): 7.4 mmol/L

## 2020-09-27 LAB — LIPID PANEL
Cholesterol: 155 mg/dL (ref <200)
HDL: 91 mg/dL (ref >=40)
LDL Cholesterol (Calc): 50 mg/dL (calc)
Non-HDL Cholesterol (Calc): 64 mg/dL (calc) (ref <130)
Total CHOL/HDL Ratio: 1.7 (calc) (ref <5.0)
Triglycerides: 56 mg/dL (ref <150)

## 2020-09-27 LAB — COMPREHENSIVE METABOLIC PANEL
AG Ratio: 1.5 (calc) (ref 1.0–2.5)
ALT: 14 U/L (ref 9–46)
AST: 12 U/L (ref 10–35)
Albumin: 4.1 g/dL (ref 3.6–5.1)
Alkaline phosphatase (APISO): 57 U/L (ref 35–144)
BUN/Creatinine Ratio: 17 (calc) (ref 6–22)
BUN: 23 mg/dL (ref 7–25)
CO2: 31 mmol/L (ref 20–32)
Calcium: 9.2 mg/dL (ref 8.6–10.3)
Chloride: 103 mmol/L (ref 98–110)
Creat: 1.36 mg/dL — ABNORMAL HIGH (ref 0.70–1.25)
Globulin: 2.8 g/dL (calc) (ref 1.9–3.7)
Glucose, Bld: 102 mg/dL — ABNORMAL HIGH (ref 65–99)
Potassium: 3.8 mmol/L (ref 3.5–5.3)
Sodium: 140 mmol/L (ref 135–146)
Total Bilirubin: 0.5 mg/dL (ref 0.2–1.2)
Total Protein: 6.9 g/dL (ref 6.1–8.1)

## 2020-09-29 MED ORDER — TIOTROPIUM BROMIDE MONOHYDRATE 18 MCG IN CAPS: 18.0000 ug | ORAL_CAPSULE | Freq: Every day | RESPIRATORY_TRACT | 3 refills | Status: DC

## 2020-09-29 NOTE — Telephone Encounter (Signed)
Lord have [employer]  Okay I am switching to Spiriva.

## 2020-10-11 DIAGNOSIS — G4733 Obstructive sleep apnea (adult) (pediatric): Secondary | ICD-10-CM | POA: Diagnosis not present

## 2020-10-11 DIAGNOSIS — R0681 Apnea, not elsewhere classified: Secondary | ICD-10-CM | POA: Diagnosis not present

## 2020-10-19 ENCOUNTER — Other Ambulatory Visit: Payer: Self-pay | Admitting: Pulmonary Disease

## 2020-10-19 ENCOUNTER — Other Ambulatory Visit: Payer: Self-pay | Admitting: Sports Medicine

## 2020-10-19 MED ORDER — METFORMIN HCL 1000 MG PO TABS: ORAL_TABLET | ORAL | 3 refills | Status: DC

## 2020-10-22 ENCOUNTER — Ambulatory Visit (INDEPENDENT_AMBULATORY_CARE_PROVIDER_SITE_OTHER): Payer: Medicare HMO | Admitting: Sports Medicine

## 2020-10-22 ENCOUNTER — Encounter: Payer: Self-pay | Admitting: Sports Medicine

## 2020-10-22 ENCOUNTER — Other Ambulatory Visit: Payer: Self-pay

## 2020-10-22 DIAGNOSIS — R053 Chronic cough: Secondary | ICD-10-CM | POA: Diagnosis not present

## 2020-10-22 DIAGNOSIS — F411 Generalized anxiety disorder: Secondary | ICD-10-CM | POA: Diagnosis not present

## 2020-10-22 DIAGNOSIS — G4733 Obstructive sleep apnea (adult) (pediatric): Secondary | ICD-10-CM | POA: Diagnosis not present

## 2020-10-22 NOTE — Assessment & Plan Note (Signed)
James Frye has noted generalized nervousness when at work, he is nontender medication, I am going to set him up with virtual behavioral therapy.

## 2020-10-22 NOTE — Assessment & Plan Note (Signed)
James Frye returns, he has a chronic cough of unclear etiology, he has been in touch with pulmonology, we have tried nasal steroids which are ineffective, nasal antihistamines such as azelastine which were ineffective. Chlorpheniramine/dextromethorphan not sufficiently effective, pre and postbronchodilator spirometry unrevealing, high-resolution chest CT, testing for tuberculosis, Goodpasture syndrome, alpha-1 antitrypsin deficiency, vasculitides, sarcoidosis all unrevealing. At the last visit we had tried multiple LABAs and LAMAs, all were too expensive. We added nasal Atrovent this seems to help his cough significantly, no change in plan for now.

## 2020-10-22 NOTE — Progress Notes (Signed)
    Procedures performed today:    None.  Independent interpretation of notes and tests performed by another provider:   None.  Brief History, Exam, Impression, and Recommendations:    Chronic cough James Frye returns, he has a chronic cough of unclear etiology, he has been in touch with pulmonology, we have tried nasal steroids which are ineffective, nasal antihistamines such as azelastine which were ineffective. Chlorpheniramine/dextromethorphan not sufficiently effective, pre and postbronchodilator spirometry unrevealing, high-resolution chest CT, testing for tuberculosis, Goodpasture syndrome, alpha-1 antitrypsin deficiency, vasculitides, sarcoidosis all unrevealing. At the last visit we had tried multiple LABAs and LAMAs, all were too expensive. We added nasal Atrovent this seems to help his cough significantly, no change in plan for now.   Obstructive sleep apnea Currently on CPAP, compliant with use, has not noted improvements in late afternoon drowsiness, I have asked him to discuss this with his pulmonologist.  Generalized anxiety disorder James Frye has noted generalized nervousness when at work, he is nontender medication, I am going to set him up with virtual behavioral therapy.    ___________________________________________ [name]. Dianah Field, M.D., ABFM., CAQSM. Primary Care and [place] Instructor of [place] [employer] [facility] [employer]

## 2020-10-22 NOTE — Assessment & Plan Note (Signed)
Currently on CPAP, compliant with use, has not noted improvements in late afternoon drowsiness, I have asked him to discuss this with his pulmonologist.

## 2020-10-28 ENCOUNTER — Other Ambulatory Visit: Payer: Self-pay

## 2020-10-28 ENCOUNTER — Ambulatory Visit: Payer: Medicare HMO | Admitting: Pulmonary Disease

## 2020-10-28 ENCOUNTER — Encounter: Payer: Self-pay | Admitting: Pulmonary Disease

## 2020-10-28 VITALS — BP 110/80 | HR 95 | Temp 97.8°F | Ht 67.0 in | Wt 188.8 lb

## 2020-10-28 DIAGNOSIS — G4733 Obstructive sleep apnea (adult) (pediatric): Secondary | ICD-10-CM | POA: Diagnosis not present

## 2020-10-28 DIAGNOSIS — R059 Cough, unspecified: Secondary | ICD-10-CM | POA: Diagnosis not present

## 2020-10-28 NOTE — Progress Notes (Deleted)
James Frye    [id number]    21-Jul-1951  Primary Care Physician:[name], [name], MD  Referring Physician: Silverio Decamp, [place] [facility] [address] [place],  [place] [address]  Chief complaint: Follow up for chronic cough  HPI: 70 year old with history of diabetes, postnasal drip, allergic rhinitis  Complains of chronic cough 2 to 3 months, cough is nonproductive in nature, occurs more at night and on lying down Initially started on Flonase and narcotic cough suppressant with little improvement Therapy changed to chlorpheniramine DM cough syrup, Astelin in August by his by Dr. Dianah Field, primary care without any improvement and hence referred to pulmonary.  He had a high-res CT in late September 2021 with no evidence of interstitial lung disease, possible pulmonary hypertension.  Lab work including alpha-1 antitrypsin, ACE, GBM antibodies, QuantiFERON are negative.  Patient has history of GERD and sleep apnea which improved after he got a gastric bypass in 2010.  He took himself off CPAP Has about 20 pound weight gain over the past few years during the Covid pandemic and his wife tells him that he is snoring again though not as bad as before  Pets: No pets Occupation: Used to work in [employer].  Currently retired Exposures: No known exposures.  No mold, hot tub, Jacuzzi.  No feather pillows or comforters Smoking history: Used to smoke as a teenager Travel history: No significant travel history Relevant family history: Mother had amyloidosis affecting the lung  Interim history: Continues to have cough with clear white mucus.  Increased on laying down He is on PPI twice daily with no improvement in symptoms.  He is also on Flonase, antihistamine which chlorpheniramine  Home sleep study showed sleep apnea and he is awaiting initiation of CPAP later this month.  Outpatient Encounter Medications as of 10/28/2020  Medication Sig  . clobetasol ointment  (TEMOVATE) 4.66 % Apply 1 application topically 2 (two) times daily.  . hydrOXYzine (ATARAX/VISTARIL) 50 MG tablet Take 1 tablet (50 mg total) by mouth at bedtime.  Marland Kitchen ipratropium (ATROVENT) 0.06 % nasal spray Place 2 sprays into both nostrils 3 (three) times daily.  . metFORMIN (GLUCOPHAGE) 1000 MG tablet Take 1 tablet by mouth once daily with breakfast  . omeprazole (PRILOSEC) 40 MG capsule Take 1 capsule (40 mg total) by mouth 2 (two) times daily.  . rosuvastatin (CRESTOR) 20 MG tablet Take 1 tablet (20 mg total) by mouth daily.  Marland Kitchen tiotropium (SPIRIVA) 18 MCG inhalation capsule Place 1 capsule (18 mcg total) into inhaler and inhale daily.   No facility-administered encounter medications on file as of 10/28/2020.   Physical Exam: Blood pressure 138/64, pulse 73, temperature 97.6 F (36.4 C), temperature source Skin, height 5\' 7"  (1.702 m), weight 195 lb (88.5 kg), SpO2 99 %. Gen:      No acute distress HEENT:  EOMI, sclera anicteric Neck:     No masses; no thyromegaly Lungs:    Clear to auscultation bilaterally; normal respiratory effort CV:         Regular rate and rhythm; no murmurs Abd:      + bowel sounds; soft, non-tender; no palpable masses, no distension Ext:    No edema; adequate peripheral perfusion Skin:      Warm and dry; no rash Neuro: alert and oriented x 3 Psych: normal mood and affect  Data Reviewed: Imaging: High-res CT 05/07/2020-no ILD, mild air trapping, coronary artery calcification, enlarged pulmonary artery I have reviewed  the images personally.  PFTs: 06/17/2020 FVC 3.91 [108%], FEV1 3.27 [119%], F/F 83, TLC 6.72 [101%], DLCO 23.03 [92%] Normal test  Labs: CBC 06/06/2018-WBC 5, eos 3.6%, absolute eosinophil count 180 Alpha-1 antitrypsin 05/05/2020-129 Angiotensin-converting enzyme 05/05/2020-30 GBM antibody 05/05/2020-less than 1  Cardiac: Echo 06/11/2020 LVEF 55-60%, mild LVH, grade 1 diastolic dysfunction, normal RV systolic function and size.  Inadequate  TR to assess PA pressure  Sleep: Sleep study 11/21 Severe complex sleep apnea with central events.  AHI 32.3, desats to 83%.  Assessment:  Chronic cough May be related to upper airway cough syndrome, silent GERD given increased symptoms when laying down and at night CT reviewed with no interstitial lung disease and PFTs are normal.    Reassured the patient Continue Flonase, Astelin, chlorpheniramine for postnasal drip  Trial of prednisone for 1 week to see if it improves cough.  If it improves cough then will start inhalers for diagnosis of reactive airway disease, asthma Check CBC differential, IgE  GERD Continue Prilosec 40 mg twice daily  Enlarged PA Noted on CT scan however no pulmonary hypertension noted on echocardiogram.  Sleep apnea Untreated sleep apnea can present with cough.   Sleep apnea may be contributing to pulmonary hypertension as well He has not been on CPAP since 2010 after losing weight  Reviewed recent sleep study with patient.  AutoSet CPAP has been ordered and is due to be delivered later this month Reassess at return visit.  Health maintenance Up-to-date with Covid and flu vaccination  Plan/Recommendations: CPAP initiation Treatment for postnasal drip, GERD Prednisone 40 mg a day for 7 days CBC with differential, IgE  [name] MD [facility] 10/28/2020, 8:49 AM  CC: Silverio Decamp,*

## 2020-10-28 NOTE — Patient Instructions (Signed)
We have started on the CPAP We note that he was still having increased tiredness during the afternoon that the CPAP seems to be working  Recommend getting at least 8 hours of sleep every day If you continue to be sleepy after this change then I will refer you to a sleep specialist to consider additional therapies for daytime sleepiness.  Follow-up in 6 months

## 2020-10-28 NOTE — Progress Notes (Signed)
James Frye    [id number]    08/20/1950  Primary Care Physician:[name], [name], MD  Referring Physician: Silverio Decamp, [place] [facility] [address] [place],  [place] [address]  Chief complaint: Follow up for chronic cough  HPI: 70 year old with history of diabetes, postnasal drip, allergic rhinitis  Complains of chronic cough 2 to 3 months, cough is nonproductive in nature, occurs more at night and on lying down Initially started on Flonase and narcotic cough suppressant with little improvement Therapy changed to chlorpheniramine DM cough syrup, Astelin in August by his by Dr. Dianah Field, primary care without any improvement and hence referred to pulmonary.  He had a high-res CT in late September 2021 with no evidence of interstitial lung disease, possible pulmonary hypertension.  Lab work including alpha-1 antitrypsin, ACE, GBM antibodies, QuantiFERON are negative.  Patient has history of GERD and sleep apnea which improved after he got a gastric bypass in 2010.  He took himself off CPAP Has about 20 pound weight gain over the past few years during the Covid pandemic and his wife tells him that he is snoring again though not as bad as before  Pets: No pets Occupation: Used to work in [employer].  Now works with [employer] Exposures: No known exposures.  No mold, hot tub, Jacuzzi.  No feather pillows or comforters Smoking history: Used to smoke as a teenager Travel history: No significant travel history Relevant family history: Mother had amyloidosis affecting the lung  Interim history: Continues to have intermittent cough. Continues on PPI.  Minimal relief with Flonase and antihistamine  Started on CPAP in January 2022 for diagnosis of sleep apnea He is using it every day without issue but continues to have daytime somnolence  Notes that he gets only 6 hours of sleep at night due to his work  Outpatient Encounter Medications as of 10/28/2020   Medication Sig  . clobetasol ointment (TEMOVATE) 1.32 % Apply 1 application topically 2 (two) times daily.  . hydrOXYzine (ATARAX/VISTARIL) 50 MG tablet Take 1 tablet (50 mg total) by mouth at bedtime.  Marland Kitchen ipratropium (ATROVENT) 0.06 % nasal spray Place 2 sprays into both nostrils 3 (three) times daily.  . metFORMIN (GLUCOPHAGE) 1000 MG tablet Take 1 tablet by mouth once daily with breakfast  . omeprazole (PRILOSEC) 40 MG capsule Take 1 capsule (40 mg total) by mouth 2 (two) times daily.  . rosuvastatin (CRESTOR) 20 MG tablet Take 1 tablet (20 mg total) by mouth daily.  Marland Kitchen tiotropium (SPIRIVA) 18 MCG inhalation capsule Place 1 capsule (18 mcg total) into inhaler and inhale daily.   No facility-administered encounter medications on file as of 10/28/2020.   Physical Exam: Blood pressure 110/80, pulse 95, temperature 97.8 F (36.6 C), temperature source Temporal, height 5\' 7"  (1.702 m), weight 188 lb 12.8 oz (85.6 kg), SpO2 99 %. Gen:      No acute distress HEENT:  EOMI, sclera anicteric Neck:     No masses; no thyromegaly Lungs:    Clear to auscultation bilaterally; normal respiratory effort CV:         Regular rate and rhythm; no murmurs Abd:      + bowel sounds; soft, non-tender; no palpable masses, no distension Ext:    No edema; adequate peripheral perfusion Skin:      Warm and dry; no rash Neuro: alert and oriented x 3 Psych: normal mood and affect  Data Reviewed: Imaging: High-res CT 05/07/2020-no ILD, mild  air trapping, coronary artery calcification, enlarged pulmonary artery I have reviewed the images personally.  PFTs: 06/17/2020 FVC 3.91 [108%], FEV1 3.27 [119%], F/F 83, TLC 6.72 [101%], DLCO 23.03 [92%] Normal test  Labs: CBC 06/06/2018- WBC 5, eos 3.6%, absolute eosinophil count 180 CBC 09/08/2020-WBC 5.4, eos 4.3%, absolute eosinophil count 232  IgE 09/08/2020-67  Alpha-1 antitrypsin 05/05/2020-129 Angiotensin-converting enzyme 05/05/2020-30 GBM antibody 05/05/2020-less  than 1  Cardiac: Echo 06/11/2020 LVEF 55-60%, mild LVH, grade 1 diastolic dysfunction, normal RV systolic function and size.  Inadequate TR to assess PA pressure  Sleep: Sleep study 11/21 Severe complex sleep apnea with central events.  AHI 32.3, desats to 83%.  Assessment:  Chronic cough May be related to upper airway cough syndrome, silent GERD given increased symptoms when laying down and at night CT reviewed with no interstitial lung disease and PFTs are normal.    Reassured the patient Continue Flonase, Astelin, chlorpheniramine for postnasal drip  GERD Continue Prilosec 40 mg twice daily  Enlarged PA Noted on CT scan however no pulmonary hypertension noted on echocardiogram.  Sleep apnea May be contributing to cough and pulmonary hypertension He has been started on CPAP and is compliant with good response but still has daytime somnolence  Discussed sleep hygiene with patient and encouraged him to sleep at least 8 hours every day.  If he continues to have daytime somnolence then we discussed referral to a sleep specialist for reevaluation.  Health maintenance Up-to-date with Covid and flu vaccination  Plan/Recommendations: Continue CPAP, get 8 hours/day of sleep Treatment for postnasal drip, GERD  [name] MD [facility] 10/28/2020, 8:52 AM  CC: Silverio Decamp,*

## 2020-12-03 ENCOUNTER — Ambulatory Visit (INDEPENDENT_AMBULATORY_CARE_PROVIDER_SITE_OTHER): Payer: Medicare HMO | Admitting: Sports Medicine

## 2020-12-03 ENCOUNTER — Encounter: Payer: Self-pay | Admitting: Sports Medicine

## 2020-12-03 ENCOUNTER — Other Ambulatory Visit: Payer: Self-pay

## 2020-12-03 DIAGNOSIS — F411 Generalized anxiety disorder: Secondary | ICD-10-CM

## 2020-12-03 NOTE — Progress Notes (Signed)
    Procedures performed today:    None.  Independent interpretation of notes and tests performed by another provider:   None.  Brief History, Exam, Impression, and Recommendations:    No problem-specific Assessment & Plan notes found for this encounter.    ___________________________________________ Faron Whitelock J. Jozie Wulf, M.D., ABFM., CAQSM. Primary Care and Sports Medicine [facility]  Adjunct Instructor of Family Medicine  [employer] 

## 2020-12-03 NOTE — Assessment & Plan Note (Signed)
James Frye is doing really well, he did not start any behavioral therapy, he is not on any medications and symptoms are overall well controlled, we can hold off on additional or further treatment.

## 2020-12-16 DIAGNOSIS — G4733 Obstructive sleep apnea (adult) (pediatric): Secondary | ICD-10-CM | POA: Diagnosis not present

## 2020-12-20 ENCOUNTER — Ambulatory Visit (INDEPENDENT_AMBULATORY_CARE_PROVIDER_SITE_OTHER): Payer: Medicare HMO | Admitting: Licensed Clinical Social Worker

## 2020-12-20 DIAGNOSIS — F411 Generalized anxiety disorder: Secondary | ICD-10-CM | POA: Diagnosis not present

## 2020-12-20 NOTE — Progress Notes (Signed)
Virtual Visit via Video Note  I connected with James Frye on 12/20/20 at  9:00 AM EDT by a video enabled telemedicine application and verified that I am speaking with the correct person using two identifiers.  Location: Patient: Home Provider: Office   I discussed the limitations of evaluation and management by telemedicine and the availability of in person appointments. The patient expressed understanding and agreed to proceed.    I discussed the assessment and treatment plan with the patient. The patient was provided an opportunity to ask questions and all were answered. The patient agreed with the plan and demonstrated an understanding of the instructions.    Comprehensive Clinical Assessment (CCA) Note  12/20/2020 James Frye [id number]  Chief Complaint:  Chief Complaint  Patient presents with  . 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, loves life, connected spiritually  Preferences: prefers some time to himself at times, prefers spending time with wife, prefers attending church, prefers Immunologist, prefers shopping at times  Abilities: Building services engineer, loves life   Type of Services Patient Feels are Needed: Therapy   Initial Clinical Notes/Concerns: Symptoms started 6 months to a year ago when he changed to a new job, symptoms occur daily, symptoms are mild to moderate   Mental Health Symptoms Depression:  None   Duration of Depressive symptoms: No data recorded  Mania:  None   Anxiety:   Tension; Restlessness    Psychosis:  No data recorded  Duration of Psychotic symptoms: No data recorded  Trauma:  None   Obsessions:  None   Compulsions:  None   Inattention:  None   Hyperactivity/Impulsivity:  N/A   Oppositional/Defiant Behaviors:  None   Emotional Irregularity:  None   Other Mood/Personality Symptoms:  N/A    Mental Status Exam Appearance and self-care  Stature:  Average   Weight:  Average weight   Clothing:  Casual   Grooming:  Normal   Cosmetic use:  None   Posture/gait:  Normal   Motor activity:  Not Remarkable   Sensorium  Attention:  Normal   Concentration:  Normal   Orientation:  X5   Recall/memory:  Normal   Affect and Mood  Affect:  Appropriate   Mood:  Anxious   Relating  Eye contact:  Normal   Facial expression:  Responsive   Attitude toward examiner:  Cooperative   Thought and Language  Speech flow: Normal   Thought content:  Appropriate to Mood and Circumstances   Preoccupation:  None   Hallucinations:  None   Organization:  No data recorded  [employer] of Knowledge:  Good   Intelligence:  Average   Abstraction:  Normal   Judgement:  Normal   Reality Testing:  Adequate   Insight:  Good   Decision Making:  Normal   Social Functioning  Social Maturity:  Responsible   Social Judgement:  Normal   Stress  Stressors:  Transitions   Coping Ability:  Normal   Skill Deficits:  None   Supports:  Family     Religion: Religion/Spirituality Are You A Religious Person?: Yes What is Your  Religious Affiliation?: Christian How Might This Affect Treatment?: Support in treatment  Leisure/Recreation: Leisure / Recreation Do You Have Hobbies?: No  Exercise/Diet: Exercise/Diet Do You Exercise?: No Have You Gained or Lost A Significant Amount of Weight in the Past Six Months?: No Do You Follow a Special Diet?: No Do You Have Any Trouble Sleeping?: Yes Explanation of Sleeping Difficulties: Uses a CPAP  which is uncomfortable to sleep with   CCA Employment/Education Employment/Work Situation: Employment / Work Situation Employment situation: Employed Where is patient currently employed?: [employer] How long has patient been employed?: Since Nov. 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)  Education: Education Is Patient Currently Attending School?: No Last Grade Completed: 12 Name of High School: [employer] Did You Graduate From [employer]?: Yes Did Physicist, medical?:  (Some college) Did Archie?: No Did You Have Any Special Interests In School?: None Did You Have An Individualized Education Program (IIEP): No Did You Have Any Difficulty At School?: No Patient's Education Has Been Impacted by Current Illness: No   CCA Family/Childhood History Family and Relationship History: Family history Marital status: Married Number of Years Married: 38 What types of issues is patient dealing with in the relationship?: None Additional relationship information: None Are you sexually active?: No What is your sexual orientation?: Heterosexual Has your sexual activity been affected by drugs, alcohol, medication, or emotional stress?: Age Does patient have children?: Yes How many children?: 3 (Previous marriage) How is patient's relationship with their children?: 1 daughter, 2 sons: Good  Childhood History:  Childhood History By whom was/is the patient raised?: Both parents Additional childhood history information: Both parents were in the home. Patient describes childhood as "normal for a black family." Description of patient's relationship with caregiver when they were a child: Mother: great,  Father: good but he  worked at lot Patient's description of current relationship with people who raised him/her: Mother:  deceased,     Father: deceased How were you disciplined when you got in trouble as a child/adolescent?: spanked, grounded, things taken away, talked Does patient have siblings?: Yes Number of Siblings: 2 Description of patient's current relationship with siblings: 2 Brothers: one deceased, good relationship with living brother Did patient suffer any verbal/emotional/physical/sexual abuse as a child?: No Did patient suffer from severe childhood neglect?: No Has patient ever been sexually abused/assaulted/raped as an adolescent or adult?: No Was the patient ever a victim of a crime or a disaster?: No Witnessed domestic violence?: No Has patient been affected by domestic violence as an adult?: No  Child/Adolescent Assessment:     CCA Substance Use Alcohol/Drug Use: Alcohol / Drug Use Pain Medications: See patient MAR Prescriptions: See patient MAR Over the Counter: See patient MAR History of alcohol / drug use?: No history of alcohol / drug abuse                         ASAM's:  Six Dimensions of Multidimensional Assessment  Dimension 1:  Acute Intoxication and/or Withdrawal Potential:      Dimension 2:  Biomedical Conditions and Complications:      Dimension 3:  Emotional, Behavioral, or Cognitive Conditions and Complications:     Dimension  4:  Readiness to Change:     Dimension 5:  Relapse, Continued use, or Continued Problem Potential:     Dimension 6:  Recovery/Living Environment:     ASAM Severity Score:    ASAM Recommended Level of Treatment:     Substance use Disorder (SUD)    Recommendations for Services/Supports/Treatments:    DSM5 Diagnoses: Patient Active Problem List   Diagnosis Date Noted  . Obstructive sleep apnea 10/22/2020  . Generalized anxiety disorder 10/22/2020  . Seborrheic keratosis 04/25/2019  . Dupuytrens contracture 10/22/2018  .  Normocytic anemia 05/04/2018  . Vertigo 05/02/2018  . Dry skin 08/22/2017  . Right cervical radiculopathy 06/27/2016  . Ocular migraine 02/23/2016  . Benign prostate hyperplasia 12/29/2015  . Chronic cough 11/23/2015  . CAD (coronary artery disease) 07/22/2014  . Annual physical exam 11/07/2012  . Insomnia 11/07/2012  . Perennial allergic rhinitis 11/07/2012  . H/O gastric bypass 10/09/2012  . Erectile dysfunction associated with type 2 diabetes mellitus ([place]) 11/14/2011  . S/P stroke due to cerebrovascular disease 11/14/2011  . Diabetes mellitus type 2, controlled ([place]) 11/14/2011    Patient Centered Plan: Patient is on the following Treatment Plan(s):  Anxiety   Referrals to Alternative Service(s): Referred to Alternative Service(s):   Place:   Date:   Time:    Referred to Alternative Service(s):   Place:   Date:   Time:    Referred to Alternative Service(s):   Place:   Date:   Time:    Referred to Alternative Service(s):   Place:   Date:   Time:     The patient was advised to call back or seek an in-person evaluation if the symptoms worsen or if the condition fails to improve as anticipated.  I provided 60 minutes of non-face-to-face time during this encounter.    Glori Bickers, LCSW

## 2020-12-28 ENCOUNTER — Encounter: Payer: Self-pay | Admitting: Medical-Surgical

## 2020-12-28 ENCOUNTER — Ambulatory Visit (INDEPENDENT_AMBULATORY_CARE_PROVIDER_SITE_OTHER): Payer: Medicare HMO | Admitting: Medical-Surgical

## 2020-12-28 ENCOUNTER — Other Ambulatory Visit: Payer: Self-pay

## 2020-12-28 VITALS — BP 123/74 | HR 72 | Temp 98.9°F | Ht 67.0 in | Wt 185.0 lb

## 2020-12-28 DIAGNOSIS — R21 Rash and other nonspecific skin eruption: Secondary | ICD-10-CM

## 2020-12-28 LAB — CBC WITH DIFFERENTIAL/PLATELET
Absolute Monocytes: 451 cells/uL (ref 200–950)
Basophils Absolute: 19 cells/uL (ref 0–200)
Basophils Relative: 0.4 %
Eosinophils Absolute: 193 cells/uL (ref 15–500)
Eosinophils Relative: 4.1 %
HCT: 37.1 % — ABNORMAL LOW (ref 38.5–50.0)
Hemoglobin: 12 g/dL — ABNORMAL LOW (ref 13.2–17.1)
Lymphs Abs: 1001 cells/uL (ref 850–3900)
MCH: 28.8 pg (ref 27.0–33.0)
MCHC: 32.3 g/dL (ref 32.0–36.0)
MCV: 89 fL (ref 80.0–100.0)
MPV: 10.3 fL (ref 7.5–12.5)
Monocytes Relative: 9.6 %
Neutro Abs: 3036 cells/uL (ref 1500–7800)
Neutrophils Relative %: 64.6 %
Platelets: 222 10*3/uL (ref 140–400)
RBC: 4.17 10*6/uL — ABNORMAL LOW (ref 4.20–5.80)
RDW: 12.5 % (ref 11.0–15.0)
Total Lymphocyte: 21.3 %
WBC: 4.7 10*3/uL (ref 3.8–10.8)

## 2020-12-28 MED ORDER — PREDNISONE 20 MG PO TABS: ORAL_TABLET | ORAL | 0 refills | Status: AC

## 2020-12-28 NOTE — Progress Notes (Signed)
Subjective:    CC: Rash  HPI: Pleasant 70 year old male presenting today for evaluation of a rash that he discovered yesterday.  Notes that he got out of the shower yesterday and was itching pretty bad on his bilateral inner arms.  On evaluation of his skin, he noted that there was a rash present.  Later in the day he noted that the rash had spread and was present on his back, abdomen, bilateral arms, and bilateral lower extremities.  Has not had any new exposures to chemicals, medications, cosmetics, materials, or environmental agents.  Usually wears long sleeve shirts and pants.  Has not been outside working or doing lawn care.  No pets in the home.  Denies fever, chills, shortness of breath, chest pain, GI symptoms, and recent illnesses.  No new vaccines.  I reviewed the past medical history, family history, social history, surgical history, and allergies today and no changes were needed.  Please see the problem list section below in epic for further details.  Past Medical History: Past Medical History:  Diagnosis Date  . CAD (coronary artery disease)   . Diabetes mellitus   . Hiatal hernia    Past Surgical History: Past Surgical History:  Procedure Laterality Date  . APPENDECTOMY    . GASTRIC BYPASS     Social History: Social History   Socioeconomic History  . Marital status: Married    Spouse name: Not on file  . Number of children: 3  . Years of education: Not on file  . Highest education level: Not on file  Occupational History  . Not on file  Tobacco Use  . Smoking status: Former Research scientist (life sciences)  . Smokeless tobacco: Never Used  Substance and Sexual Activity  . Alcohol use: No  . Drug use: No  . Sexual activity: Yes  Other Topics Concern  . Not on file  Social History Narrative  . Not on file   Social Determinants of Health   Financial Resource Strain: Not on file  Food Insecurity: Not on file  Transportation Needs: Not on file  Physical Activity: Not on file   Stress: Not on file  Social Connections: Not on file   Family History: Family History  Problem Relation Age of Onset  . Cancer Mother   . Stroke Father   . Heart disease Father    Allergies: Allergies  Allergen Reactions  . Penicillins Rash   Medications: See med rec.  Review of Systems: See HPI for pertinent positives and negatives.   Objective:    General: Well Developed, well nourished, and in no acute distress.  Neuro: Alert and oriented x3.  HEENT: Normocephalic, atraumatic.  Skin: Warm and dry.  Widespread erythematous maculopapular rash involving the bilateral inner arms, back, lower torso, and bilateral lower extremities.  No pustules, vesicles, or crusting noted. Cardiac: Regular rate and rhythm, no murmurs rubs or gallops, no lower extremity edema.  Respiratory: Clear to auscultation bilaterally. Not using accessory muscles, speaking in full sentences.  Impression and Recommendations:    1. Rash in adult Unclear etiology.  Checking CBC with differential for possible eosinophilia indicating an allergic reaction.  No herald spot so less likely to be pityriasis.  Possible viral exanthem from exposure to the virus but no recent or current symptoms.  Treating with prednisone taper.  Recommend use of topical Benadryl for the particularly itchy spots.  Already taking hydroxyzine at night so avoid taking Benadryl with that.  Consider adding a daily Zyrtec or Claritin.  Work note  provided with paper copy and also sent to MyChart for his records. - CBC with Differential  Return if symptoms worsen or fail to improve. ___________________________________________ Clearnce Sorrel, DNP, APRN, FNP-BC Primary Care and [place]

## 2021-01-08 DIAGNOSIS — G4733 Obstructive sleep apnea (adult) (pediatric): Secondary | ICD-10-CM | POA: Diagnosis not present

## 2021-01-08 DIAGNOSIS — R0681 Apnea, not elsewhere classified: Secondary | ICD-10-CM | POA: Diagnosis not present

## 2021-01-20 ENCOUNTER — Ambulatory Visit (INDEPENDENT_AMBULATORY_CARE_PROVIDER_SITE_OTHER): Payer: Medicare HMO | Admitting: Sports Medicine

## 2021-01-20 ENCOUNTER — Encounter: Payer: Self-pay | Admitting: Sports Medicine

## 2021-01-20 ENCOUNTER — Other Ambulatory Visit: Payer: Self-pay

## 2021-01-20 DIAGNOSIS — N521 Erectile dysfunction due to diseases classified elsewhere: Secondary | ICD-10-CM

## 2021-01-20 DIAGNOSIS — F411 Generalized anxiety disorder: Secondary | ICD-10-CM

## 2021-01-20 DIAGNOSIS — G250 Essential tremor: Secondary | ICD-10-CM

## 2021-01-20 DIAGNOSIS — E1169 Type 2 diabetes mellitus with other specified complication: Secondary | ICD-10-CM

## 2021-01-20 MED ORDER — PRIMIDONE 50 MG PO TABS
25.0000 mg | ORAL_TABLET | Freq: Every day | ORAL | 3 refills | Status: DC
Start: 2021-01-20 — End: 2021-02-18

## 2021-01-20 NOTE — Assessment & Plan Note (Addendum)
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 I think the underlying issue is his essential tremor which is exacerbating the underlying mild anxiety. We will start with primidone, he understands not to drink alcohol with this drug, we can follow this up in a month and see how the tremor is doing. If insufficient improvement we will consider additional treatment with SSRI.  This is a chronic process with exacerbation and pharmacologic intervention.

## 2021-01-20 NOTE — Assessment & Plan Note (Signed)
James Frye also has refractory erectile dysfunction, we have tried multiple modalities including multiple 5 phosphodiesterase inhibitors, he has tried multiple intracavernosal injections, ultimately the most recent injection seems to help but he is certainly not interested in doing penile injections, he is interested in implantable penile prosthesis with urology however he would like to hold off and think about it a bit longer before considering this referral.  He currently sees [facility] urology [place].

## 2021-01-20 NOTE — Progress Notes (Signed)
    Procedures performed today:    None.  Independent interpretation of notes and tests performed by another provider:   None.  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 I think the underlying issue is his essential tremor which is exacerbating the underlying mild anxiety. We will start with primidone, he understands not to drink alcohol with this drug, we can follow this up in a month and see how the tremor is doing. If insufficient improvement we will consider additional treatment with SSRI.  This is a chronic process with exacerbation and pharmacologic intervention.  Generalized anxiety disorder James Frye does have some underlying anxiety, we would consider SSRI treatment at the follow-up visit if he is not noticing any improvement with primidone or with an up taper.  Erectile dysfunction associated with type 2 diabetes mellitus James Frye also has refractory erectile dysfunction, we have tried multiple modalities including multiple 5 phosphodiesterase inhibitors, he has tried multiple intracavernosal injections, ultimately the most recent injection seems to help but he is certainly not interested in doing penile injections, he is interested in implantable penile prosthesis with urology however he would like to hold off and think about it a bit longer before considering this referral.  He currently sees [place] urology Jule Ser.    ___________________________________________ [name]. Dianah Field, M.D., ABFM., CAQSM. Primary Care and [place] Instructor of [place] of [facility] of Medicine

## 2021-01-20 NOTE — Assessment & Plan Note (Signed)
James Frye does have some underlying anxiety, we would consider SSRI treatment at the follow-up visit if he is not noticing any improvement with primidone or with an up taper.

## 2021-01-20 NOTE — Telephone Encounter (Signed)
Patient has been scheduled with Dr T . AM

## 2021-01-26 DIAGNOSIS — G4733 Obstructive sleep apnea (adult) (pediatric): Secondary | ICD-10-CM | POA: Diagnosis not present

## 2021-01-31 DIAGNOSIS — Z0000 Encounter for general adult medical examination without abnormal findings: Secondary | ICD-10-CM

## 2021-01-31 MED ORDER — SHINGRIX 50 MCG/0.5ML IM SUSR: 0.5000 mL | Freq: Once | INTRAMUSCULAR | 0 refills | Status: AC

## 2021-02-08 DIAGNOSIS — G4733 Obstructive sleep apnea (adult) (pediatric): Secondary | ICD-10-CM | POA: Diagnosis not present

## 2021-02-08 DIAGNOSIS — R0681 Apnea, not elsewhere classified: Secondary | ICD-10-CM | POA: Diagnosis not present

## 2021-02-15 ENCOUNTER — Telehealth: Payer: Self-pay | Admitting: Sports Medicine

## 2021-02-15 NOTE — Chronic Care Management (AMB) (Signed)
  Chronic Care Management   Note  02/15/2021 Name: James Frye MRN: [id number] DOB: April 04, 1951  James Frye is a 70 y.o. year old male who is a primary care patient of Dianah Field, [name], MD. I reached out to Barkley Bruns by phone today in response to a referral sent by Mr. Brenton Joines Gearhart's PCP, Silverio Decamp, MD.   Mr. Triggs was given information about Chronic Care Management services today including:  CCM service includes personalized support from designated clinical staff supervised by his physician, including individualized plan of care and coordination with other care providers 24/7 contact phone numbers for assistance for urgent and routine care needs. Service will only be billed when office clinical staff spend 20 minutes or more in a month to coordinate care. Only one practitioner may furnish and bill the service in a calendar month. The patient may stop CCM services at any time (effective at the end of the month) by phone call to the office staff.   Patient agreed to services and verbal consent obtained.   Follow up plan:   Lauretta Grill [employer] Scheduler

## 2021-02-17 ENCOUNTER — Ambulatory Visit: Payer: Medicare HMO | Admitting: Sports Medicine

## 2021-02-18 ENCOUNTER — Other Ambulatory Visit: Payer: Self-pay

## 2021-02-18 ENCOUNTER — Encounter: Payer: Self-pay | Admitting: Sports Medicine

## 2021-02-18 ENCOUNTER — Ambulatory Visit (INDEPENDENT_AMBULATORY_CARE_PROVIDER_SITE_OTHER): Payer: Medicare HMO | Admitting: Sports Medicine

## 2021-02-18 DIAGNOSIS — G250 Essential tremor: Secondary | ICD-10-CM

## 2021-02-18 MED ORDER — PRIMIDONE 50 MG PO TABS
50.0000 mg | ORAL_TABLET | Freq: Every day | ORAL | 3 refills | Status: DC
Start: 2021-02-18 — End: 2021-06-22

## 2021-02-18 NOTE — Assessment & Plan Note (Signed)
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 James Frye also has significant work-related anxiety which exacerbates the essential tremor, and then the tremor then worsens his work anxiety. We started primidone one half tab at the last visit, James Frye understands not to drink alcohol with this drug. James Frye has noted some improvements in his tremor to the point where James Frye is able to function and scan objects into the computer. We will go up to a full tab nightly. Ultimately James Frye does plan to quit this job.

## 2021-02-18 NOTE — Progress Notes (Signed)
    Procedures performed today:    None.  Independent interpretation of notes and tests performed by another provider:   None.  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 He also has significant work-related anxiety which exacerbates the essential tremor, and then the tremor then worsens his work anxiety. We started primidone one half tab at the last visit, he understands not to drink alcohol with this drug. He has noted some improvements in his tremor to the point where he is able to function and scan objects into the computer. We will go up to a full tab nightly. Ultimately he does plan to quit this job.    ___________________________________________ [name]. Dianah Field, M.D., ABFM., CAQSM. Primary Care and [place] Instructor of [place] of [facility] of Medicine

## 2021-02-22 IMAGING — CT CT ANGIO CHEST
2 of 8 series · 19 of 36 positions shown · IV contrast (omnipaque)
Comparison: 05/07/2020

CLINICAL DATA: Chest pain, presyncope, elevated D-dimer

EXAM:
CT ANGIOGRAPHY CHEST WITH CONTRAST
TECHNIQUE: Multidetector CT imaging of the chest was performed using the
standard protocol during bolus administration of intravenous
contrast. Multiplanar CT image reconstructions and MIPs were
obtained to evaluate the vascular anatomy.
CONTRAST:  100mL OMNIPAQUE IOHEXOL 350 MG/ML SOLN

[Series 6: pe thins · axial · 0.81mm/px · z∈[-334,-50]mm · 18 of 318 slices shown]
[im 17/318  lung]
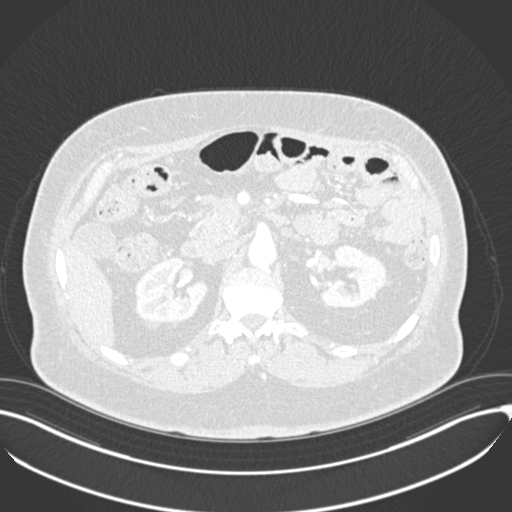
[im 34/318  mediastinal]
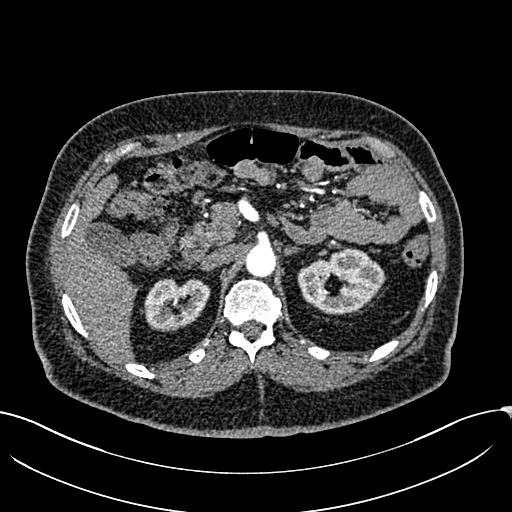
[im 51/318  lung]
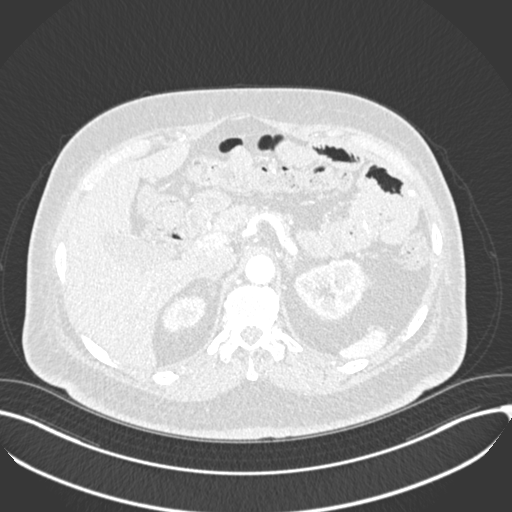
[im 67/318  mediastinal]
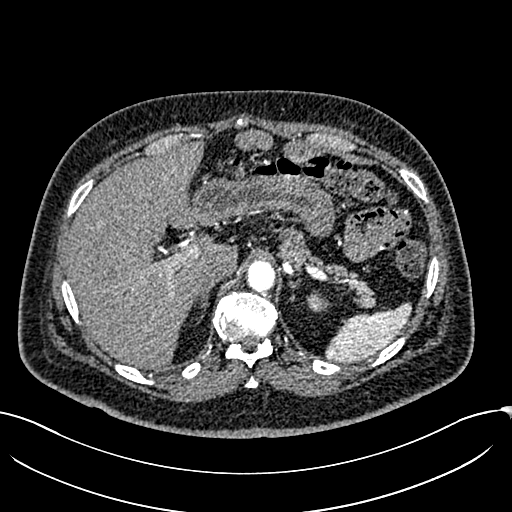
[im 84/318  lung]
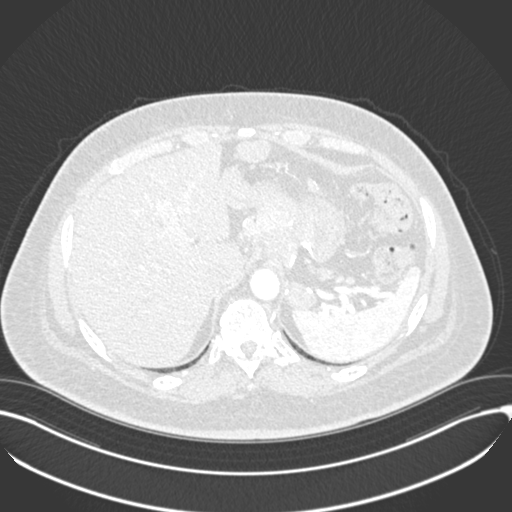
[im 101/318  mediastinal]
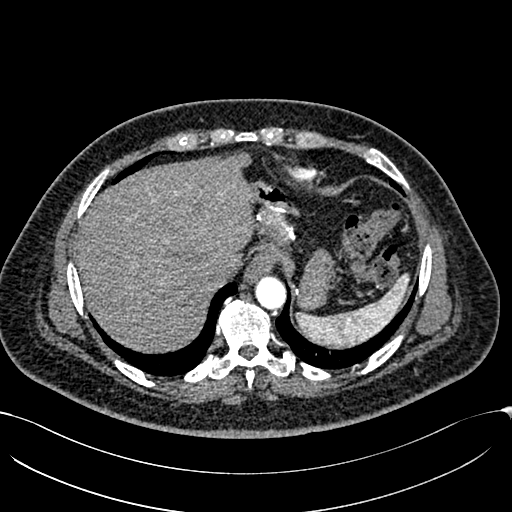
[im 117/318  lung]
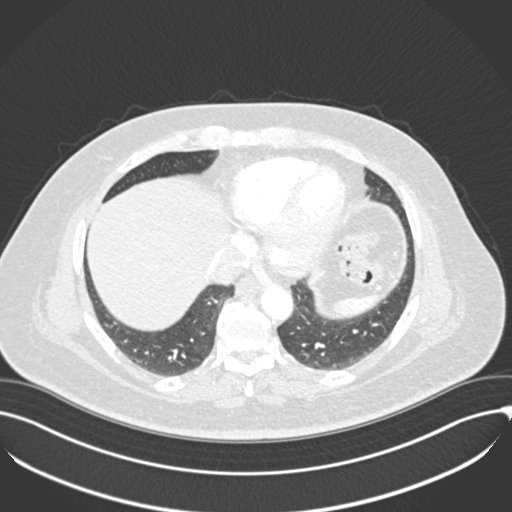
[im 134/318  mediastinal]
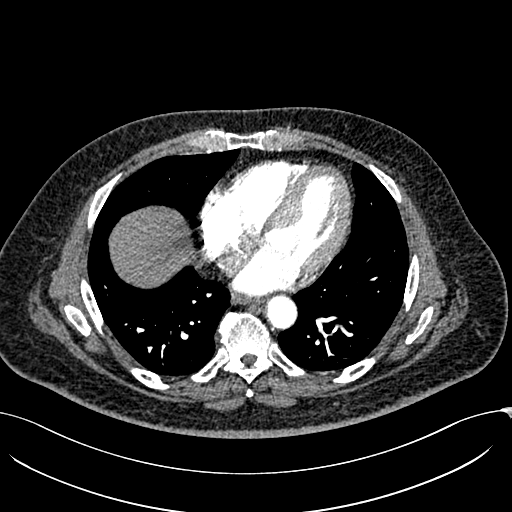
[im 151/318  lung]
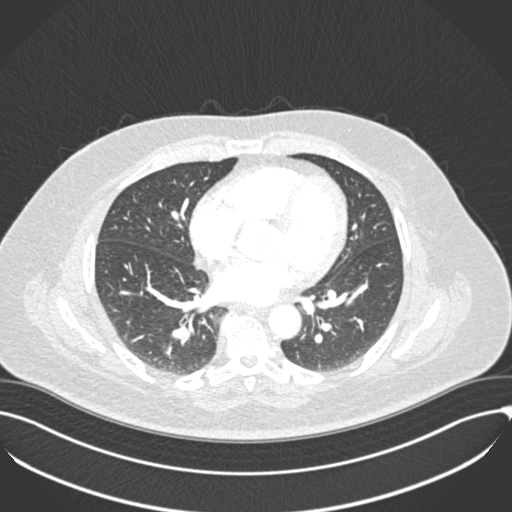
[im 167/318  mediastinal]
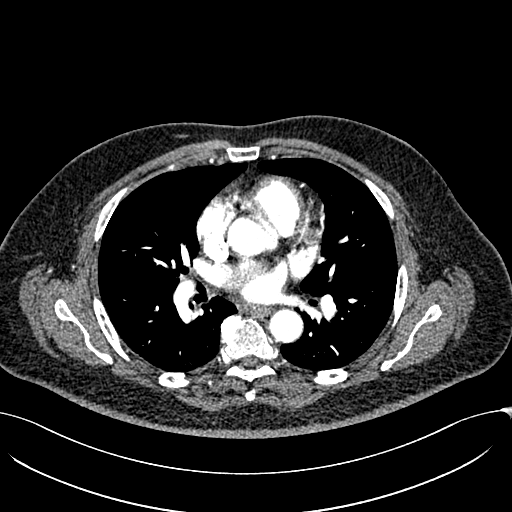
[im 184/318  lung]
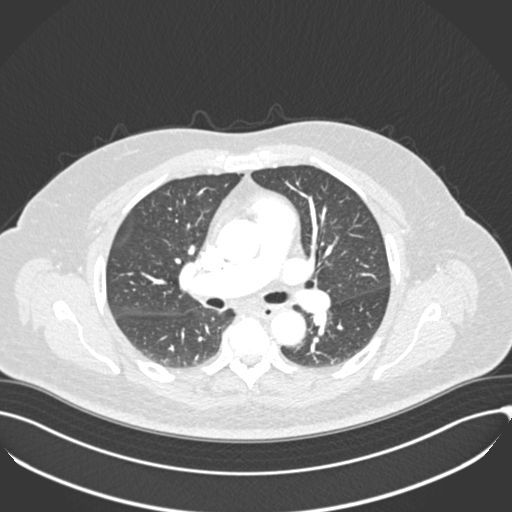
[im 201/318  mediastinal]
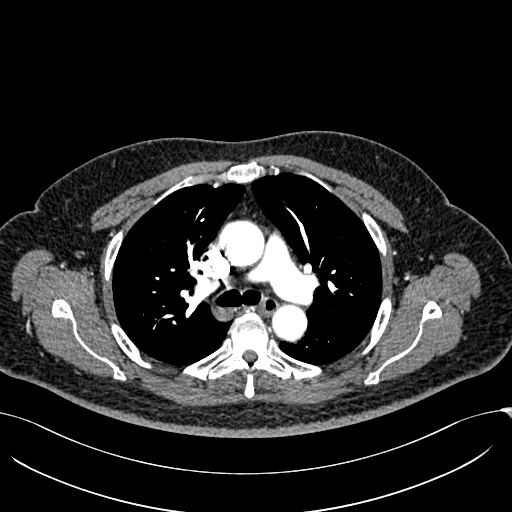
[im 217/318  lung]
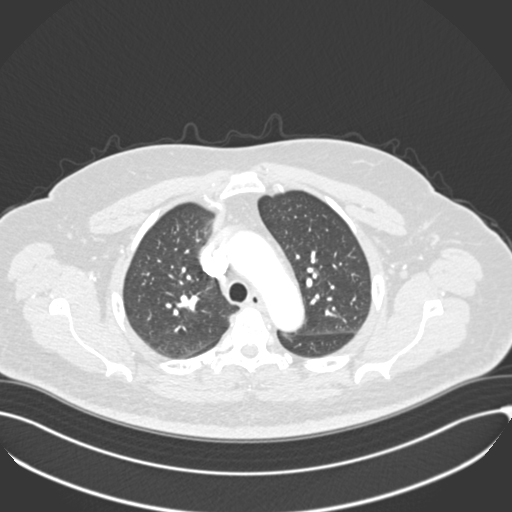
[im 234/318  mediastinal]
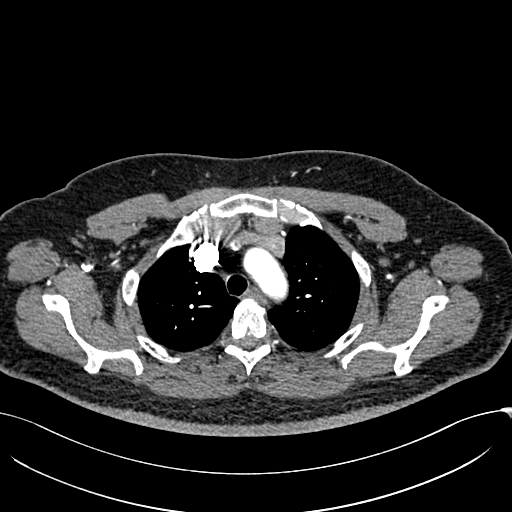
[im 251/318  lung]
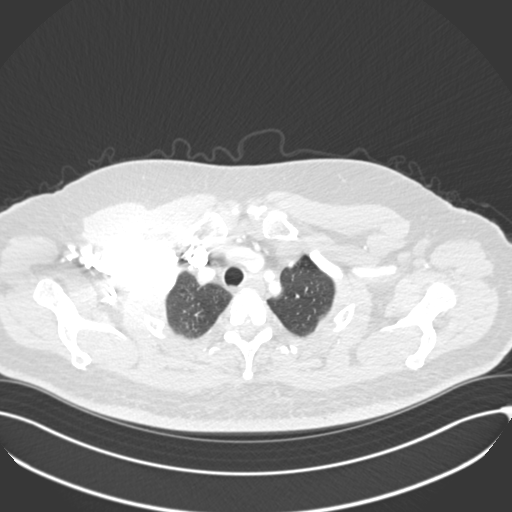
[im 267/318  mediastinal]
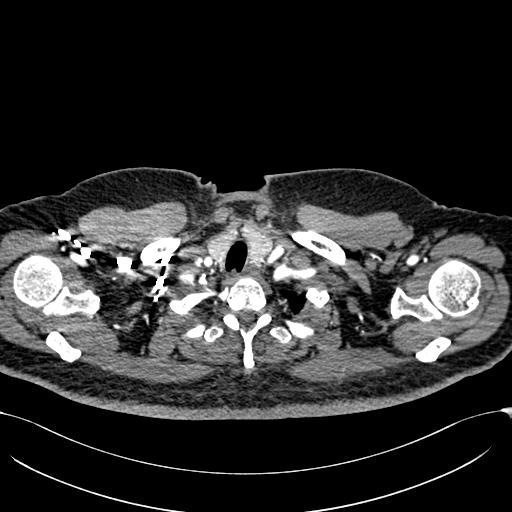
[im 284/318  lung]
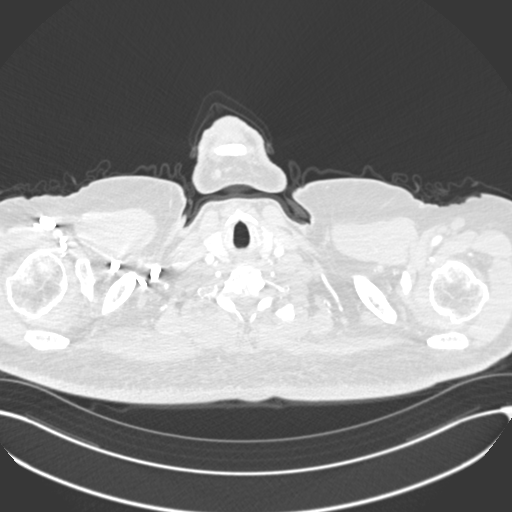
[im 301/318  mediastinal]
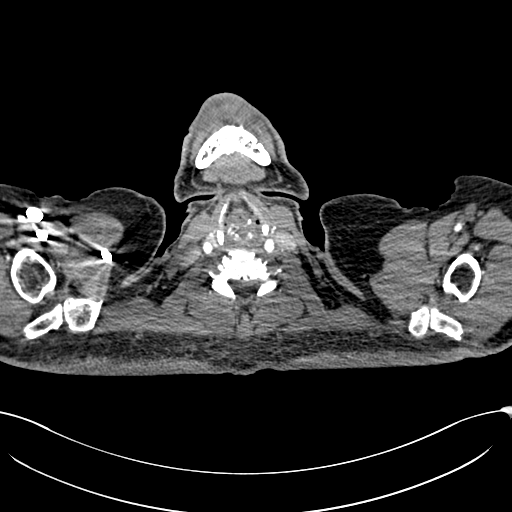

[Series 7: pe coronal mpr · coronal · 0.63mm/px · 1 of 136 slices shown]
[im 68/136  mediastinal]
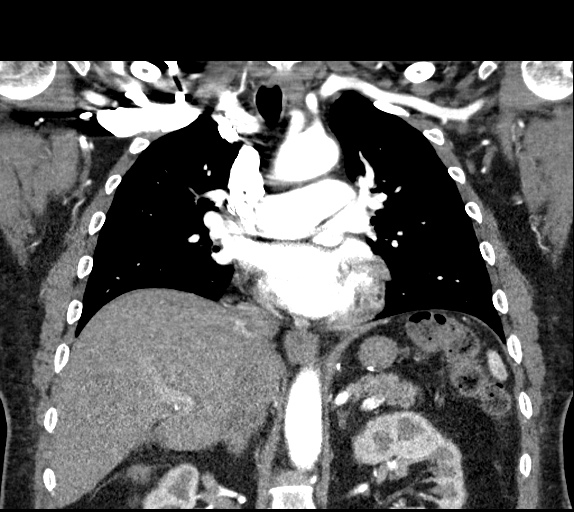

[19 of 36 positions shown; findings below may reference images not displayed]

FINDINGS: Cardiovascular: There is adequate opacification of the pulmonary
arterial tree. No intraluminal filling defect identified to suggest
acute pulmonary embolism. Central pulmonary arteries are of normal
caliber. There is moderate coronary artery calcification
predominantly within the proximal left anterior descending coronary
artery. Cardiac size within normal limits. No pericardial effusion.
Thoracic aorta is of normal caliber. Mild atherosclerotic
calcification is seen within the aortic arch and descending thoracic
aorta.

Mediastinum/Nodes: No enlarged mediastinal, hilar, or axillary lymph
nodes. Thyroid gland, trachea, and esophagus demonstrate no
significant findings.

Lungs/Pleura: Lungs are clear. No pneumothorax or pleural effusion.
Central airways are widely patent.

Upper Abdomen: Surgical changes of Roux-en-Y gastric bypass are
identified. No acute abnormality seen.

Musculoskeletal: No acute bone abnormality.

Review of the MIP images confirms the above findings.
IMPRESSION: No pulmonary embolism.

Moderate left anterior descending coronary artery calcification.

Aortic Atherosclerosis ([id number]).

## 2021-02-23 ENCOUNTER — Ambulatory Visit (INDEPENDENT_AMBULATORY_CARE_PROVIDER_SITE_OTHER): Payer: Medicare HMO | Admitting: Medical-Surgical

## 2021-02-23 ENCOUNTER — Other Ambulatory Visit: Payer: Self-pay

## 2021-02-23 ENCOUNTER — Encounter: Payer: Self-pay | Admitting: Medical-Surgical

## 2021-02-23 VITALS — BP 132/76 | HR 70 | Temp 99.1°F | Ht 67.0 in | Wt 185.2 lb

## 2021-02-23 DIAGNOSIS — F411 Generalized anxiety disorder: Secondary | ICD-10-CM | POA: Diagnosis not present

## 2021-02-23 DIAGNOSIS — G250 Essential tremor: Secondary | ICD-10-CM

## 2021-02-23 MED ORDER — BUSPIRONE HCL 5 MG PO TABS: 5.0000 mg | ORAL_TABLET | Freq: Two times a day (BID) | ORAL | 0 refills | Status: DC

## 2021-02-23 NOTE — Telephone Encounter (Signed)
Patient has been scheduled with Joy. AM

## 2021-02-23 NOTE — Progress Notes (Signed)
buspa Subjective:    CC: jittery   HPI: Pleasant 70 year old male presenting with complaints of having an internal jittery feeling. Having feelings of fear as if something bad is going to happen. This has been going on for several weeks. States "I feel a rushing sensation inside and I can't get it to stop". He is having difficulty sleeping on some nights, especially last night. He is taking Hydroxyzine 50mg  nightly and it helps most of the time but not last night. He has quite a bit of anxiety about his job and was supposed to return today. He decided not to work today since he had little sleep last night and is requesting a work note. He has seen his PCP regarding the essential tremor and other symptoms in the last week and was instructed to increase his Primidone to 50mg  nightly. He has not done this yet but reports he is going to the pharmacy to pick up the higher dose this afternoon.   I reviewed the past medical history, family history, social history, surgical history, and allergies today and no changes were needed.  Please see the problem list section below in epic for further details.  Past Medical History: Past Medical History:  Diagnosis Date   CAD (coronary artery disease)    Diabetes mellitus    Hiatal hernia    Past Surgical History: Past Surgical History:  Procedure Laterality Date   APPENDECTOMY     GASTRIC BYPASS     Social History: Social History   Socioeconomic History   Marital status: Married    Spouse name: Not on file   Number of children: 3   Years of education: Not on file   Highest education level: Not on file  Occupational History   Not on file  Tobacco Use   Smoking status: Former    Pack years: 0.00   Smokeless tobacco: Never  Substance and Sexual Activity   Alcohol use: No   Drug use: No   Sexual activity: Yes  Other Topics Concern   Not on file  Social History Narrative   Not on file   Social Determinants of Health   Financial Resource  Strain: Not on file  Food Insecurity: Not on file  Transportation Needs: Not on file  Physical Activity: Not on file  Stress: Not on file  Social Connections: Not on file   Family History: Family History  Problem Relation Age of Onset   Cancer Mother    Stroke Father    Heart disease Father    Allergies: Allergies  Allergen Reactions   Penicillins Rash   Medications: See med rec.  Review of Systems: See HPI for pertinent positives and negatives.   Objective:    General: Well Developed, well nourished, and in no acute distress.  Neuro: Alert and oriented x3.  HEENT: Normocephalic, atraumatic.  Skin: Warm and dry. Cardiac: Regular rate and rhythm, no murmurs rubs or gallops, no lower extremity edema.  Respiratory: Clear to auscultation bilaterally. Not using accessory muscles, speaking in full sentences.  Impression and Recommendations:    1. Benign essential tremor 2. Generalized anxiety disorder Recommend increasing the Primidone to 50mg  nightly as previously instructed. With his level of anxiety, starting BuSpar 5-10mg  BID. Recommend following up with his PCP as already scheduled.   Return for mood and essential tremor follow up with Dr. Darene Lamer in 2-3 weeks. ___________________________________________ Clearnce Sorrel, DNP, APRN, FNP-BC Primary Care and Sports Medicine [place]

## 2021-03-10 DIAGNOSIS — R0681 Apnea, not elsewhere classified: Secondary | ICD-10-CM | POA: Diagnosis not present

## 2021-03-10 DIAGNOSIS — G4733 Obstructive sleep apnea (adult) (pediatric): Secondary | ICD-10-CM | POA: Diagnosis not present

## 2021-03-18 ENCOUNTER — Telehealth: Payer: Self-pay | Admitting: Pharmacist

## 2021-03-18 NOTE — Chronic Care Management (AMB) (Signed)
    Chronic Care Management Pharmacy Assistant   Name: LY STENCIL  MRN: [id number] DOB: Apr 01, 1951  James Frye is an 70 y.o. year old male who presents for his initial CCM visit with the clinical pharmacist.  Recent office visits:  02/23/21 [name] NP- Patient seen for a Benign essential tremor. Patient started Buspirone HCI 5-10 MG daily. Return for mood and essential tremor follow up with Dr. Darene Lamer in 2-3 weeks.  02/18/21 [name] MD(PCP)- Patient was seen for Benign essential tremor. Patient Primidone increased to 50 mg daily. Follow up in 4 weeks.  01/20/21 [name] MD- Patient was seen for Benign essential tremor. Patient started on Primidone 25 MD at bedtime. Follow up in 4 weeks.  12/28/20 [name] NP- Patient was seen for Rash. Labs were ordered and Taper of Prednisone 60 MG was started. Follow up if symptoms worsen.  12/03/20 [name] MD- Patient was seen for Generalized anxiety disorder. Follow up in February 2023 for annual physical.  10/22/20 [name] MD- Patient was seen for Chronic Cough. Patient preferred to discontinue Fluticasone. Referral for Behavioral Health was placed. Follow up in 6 months.  Recent consult visits:  10/28/20 [name] MD (Pulm)- Patient was seen for Chronic cough. Follow up in 6 months.  Hospital visits:  None in previous 6 months  Medications: Outpatient Encounter Medications as of 03/18/2021  Medication Sig   busPIRone (BUSPAR) 5 MG tablet Take 1-2 tablets (5-10 mg total) by mouth 2 (two) times daily.   clobetasol ointment (TEMOVATE) [id number] % Apply 1 application topically 2 (two) times daily.   hydrOXYzine (ATARAX/VISTARIL) 50 MG tablet Take 1 tablet (50 mg total) by mouth at bedtime.   ipratropium (ATROVENT) 0.06 % nasal spray Place 2 sprays into both nostrils 3 (three) times daily.   metFORMIN (GLUCOPHAGE) 1000 MG tablet Take 1 tablet by mouth once daily with breakfast   omeprazole (PRILOSEC)  40 MG capsule Take 1 capsule (40 mg total) by mouth 2 (two) times daily.   primidone (MYSOLINE) 50 MG tablet Take 1 tablet (50 mg total) by mouth at bedtime.   rosuvastatin (CRESTOR) 20 MG tablet Take 1 tablet (20 mg total) by mouth daily.   tiotropium (SPIRIVA) 18 MCG inhalation capsule Place 1 capsule (18 mcg total) into inhaler and inhale daily.   No facility-administered encounter medications on file as of 03/18/2021.   Current Medication List busPIRone (BUSPAR) 5 MG tablet last filled 02/23/21 60 DS hydrOXYzine (ATARAX/VISTARIL) 50 MG tablet last filled 01/17/21 90 DS ipratropium (ATROVENT) 0.06 % nasal spray last filled 01/25/21 13 DS metFORMIN (GLUCOPHAGE) 1000 MG tablet last filled3/8/22 90 DS omeprazole (PRILOSEC) 40 MG capsule last filled 01/21/21 90 DS primidone (MYSOLINE) 50 MG tablet last filled 01/20/21 90 DS rosuvastatin (CRESTOR) 20 MG tablet last filled 01/17/21 90 DS tiotropium (SPIRIVA) 18 MCG inhalation capsule last filled 09/29/20 30 DS  [place] Pharmacist Assistant [phone]

## 2021-03-22 ENCOUNTER — Other Ambulatory Visit: Payer: Self-pay

## 2021-03-22 ENCOUNTER — Ambulatory Visit (INDEPENDENT_AMBULATORY_CARE_PROVIDER_SITE_OTHER): Payer: Medicare HMO | Admitting: Sports Medicine

## 2021-03-22 ENCOUNTER — Encounter: Payer: Self-pay | Admitting: Sports Medicine

## 2021-03-22 DIAGNOSIS — G250 Essential tremor: Secondary | ICD-10-CM

## 2021-03-22 NOTE — Assessment & Plan Note (Signed)
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 He has not been consistent with primidone 50 mg daily, he will get more consistent over the next month and let me know how his tremor does. He understands not to drink alcohol with the drug. He was also started on BuSpar by [name], DNP, in the spirit of making 1 change at a time we will leave him on this for now. Considering buying a Corvette versus electric car.

## 2021-03-22 NOTE — Progress Notes (Signed)
    Procedures performed today:    None.  Independent interpretation of notes and tests performed by another provider:   None.  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 He has not been consistent with primidone 50 mg daily, he will get more consistent over the next month and let me know how his tremor does. He understands not to drink alcohol with the drug. He was also started on BuSpar by [name], DNP, in the spirit of making 1 change at a time we will leave him on this for now. Considering buying a Corvette versus electric car.    ___________________________________________ [name]. Dianah Field, M.D., ABFM., CAQSM. Primary Care and [place] Instructor of [place] [facility] of Medicine

## 2021-03-24 ENCOUNTER — Other Ambulatory Visit: Payer: Self-pay | Admitting: Medical-Surgical

## 2021-03-29 ENCOUNTER — Other Ambulatory Visit: Payer: Self-pay

## 2021-03-29 ENCOUNTER — Ambulatory Visit (INDEPENDENT_AMBULATORY_CARE_PROVIDER_SITE_OTHER): Payer: Medicare HMO | Admitting: Pharmacist

## 2021-03-29 DIAGNOSIS — H43393 Other vitreous opacities, bilateral: Secondary | ICD-10-CM | POA: Diagnosis not present

## 2021-03-29 DIAGNOSIS — E119 Type 2 diabetes mellitus without complications: Secondary | ICD-10-CM | POA: Diagnosis not present

## 2021-03-29 DIAGNOSIS — N1832 Chronic kidney disease, stage 3b: Secondary | ICD-10-CM | POA: Diagnosis not present

## 2021-03-29 DIAGNOSIS — G250 Essential tremor: Secondary | ICD-10-CM

## 2021-03-29 NOTE — Progress Notes (Signed)
Chronic Care Management Pharmacy Note  03/30/2021 Name:  James Frye MRN:  [id number] DOB:  11-18-1950  Summary: addressed DM, tremor  Recommendations/Changes made from today's visit: none  Plan: f/u with pharmacist in 3 months  Subjective: James Frye is an 70 y.o. year old male who is a primary patient of [name], James Her, MD.  The CCM team was consulted for assistance with disease management and care coordination needs.    Engaged with patient by telephone for initial visit in response to provider referral for pharmacy case management and/or care coordination services.   Consent to Services:  The patient was given information about Chronic Care Management services, agreed to services, and gave verbal consent prior to initiation of services.  Please see initial visit note for detailed documentation.   Patient Care Team: Silverio Decamp, MD as PCP - General (Family Medicine) Darius Bump, [facility] as Pharmacist (Pharmacist)  Recent office visits:  02/23/21 James Bouche NP- Patient seen for a Benign essential tremor. Patient started Buspirone HCI 5-10 MG daily. Return for mood and essential tremor follow up with Dr. Darene Lamer in 2-3 weeks.   02/18/21 James Mems MD(PCP)- Patient was seen for Benign essential tremor. Patient Primidone increased to 50 mg daily. Follow up in 4 weeks.   01/20/21 James Mems MD- Patient was seen for Benign essential tremor. Patient started on Primidone 25 MD at bedtime. Follow up in 4 weeks.   12/28/20 James Bouche NP- Patient was seen for Rash. Labs were ordered and Taper of Prednisone 60 MG was started. Follow up if symptoms worsen.   12/03/20 James Mems MD- Patient was seen for Generalized anxiety disorder. Follow up in February 2023 for annual physical.   10/22/20 James Mems MD- Patient was seen for Chronic Cough. Patient preferred to discontinue Fluticasone. Referral for Behavioral Health was placed. Follow up in 6  months.   Recent consult visits:  10/28/20 James Garfinkel MD (Pulm)- Patient was seen for Chronic cough. Follow up in 6 months.   Hospital visits:  None in previous 6 months  Objective:  Lab Results  Component Value Date   CREATININE 1.36 (H) 09/24/2020   CREATININE 1.46 (H) 05/28/2020   CREATININE 1.38 (H) 09/25/2019    Lab Results  Component Value Date   HGBA1C 6.3 (H) 09/24/2020   Last diabetic Eye exam:  Lab Results  Component Value Date/Time   HMDIABEYEEXA No Retinopathy 04/14/2019 12:00 AM       Component Value Date/Time   CHOL 155 09/24/2020 0939   TRIG 56 09/24/2020 0939   HDL 91 09/24/2020 0939   CHOLHDL 1.7 09/24/2020 0939   VLDL 9 06/27/2016 0839   LDLCALC 50 09/24/2020 0939    Hepatic Function Latest Ref Rng & Units 09/24/2020 05/28/2020 09/25/2019  Total Protein 6.1 - 8.1 g/dL 6.9 7.7 7.3  Albumin 3.6 - 5.1 g/dL - - -  AST 10 - 35 U/L _0 ALT 9 - 46 U/L _1 Alk Phosphatase 40 - 115 U/L - - -  Total Bilirubin 0.2 - 1.2 mg/dL 0.5 0.7 0.5    Lab Results  Component Value Date/Time   TSH 0.87 05/28/2020 10:54 AM   TSH 1.19 01/30/2019 09:26 AM   FREET4 1.16 10/25/2012 02:31 PM    CBC Latest Ref Rng & Units 12/28/2020 09/24/2020 09/08/2020  WBC 3.8 - 10.8 Thousand/uL 4.7 10.5 5.4  Hemoglobin 13.2 - 17.1 g/dL 12.0(L) 12.1(L) 11.6(L)  Hematocrit 38.5 - 50.0 % 37.1(L)  37.0(L) 34.9(L)  Platelets 140 - 400 Thousand/uL 222 268 212.0    Social History   Tobacco Use  Smoking Status Former  Smokeless Tobacco Never   BP Readings from Last 3 Encounters:  03/22/21 111/69  02/23/21 132/76  02/18/21 119/71   Pulse Readings from Last 3 Encounters:  03/22/21 63  02/23/21 70  02/18/21 78   Wt Readings from Last 3 Encounters:  03/22/21 179 lb (81.2 kg)  02/23/21 185 lb 3.2 oz (84 kg)  02/18/21 181 lb (82.1 kg)    Assessment: Review of patient past medical history, allergies, medications, health status, including review of consultants  reports, laboratory and other test data, was performed as part of comprehensive evaluation and provision of chronic care management services.   SDOH:  (Social Determinants of Health) assessments and interventions performed:    CCM Care Plan  Allergies  Allergen Reactions   Penicillins Rash    Medications Reviewed Today     Reviewed by Darius Bump, [facility] (Pharmacist) on 03/29/21 at 1131  Med List Status: <None>   Medication Order Taking? Sig Documenting Provider Last Dose Status Informant  busPIRone (BUSPAR) 5 MG tablet [id number] Yes TAKE 1 TO 2 TABLETS(5 TO 10 MG) BY MOUTH TWICE DAILY James Bouche, NP Taking Active   clobetasol ointment (TEMOVATE) 0.05 % [id number] Yes Apply 1 application topically 2 (two) times daily. Silverio Decamp, MD Taking Active   hydrOXYzine (ATARAX/VISTARIL) 50 MG tablet [id number] Yes Take 1 tablet (50 mg total) by mouth at bedtime. Silverio Decamp, MD Taking Active   ipratropium (ATROVENT) 0.06 % nasal spray [id number] Yes Place 2 sprays into both nostrils 3 (three) times daily. Silverio Decamp, MD Taking Active            Med Note Darius Bump   Tue Mar 29, 2021 11:30 AM) Using 2 sprays each nostril once daily  metFORMIN (GLUCOPHAGE) 1000 MG tablet [id number] Yes Take 1 tablet by mouth once daily with breakfast Silverio Decamp, MD Taking Active   primidone (MYSOLINE) 50 MG tablet [id number] Yes Take 1 tablet (50 mg total) by mouth at bedtime. Silverio Decamp, MD Taking Active   rosuvastatin (CRESTOR) 20 MG tablet [id number] Yes Take 1 tablet (20 mg total) by mouth daily. Silverio Decamp, MD Taking Active   tiotropium [facility]) 18 MCG inhalation capsule [id number] No Place 1 capsule (18 mcg total) into inhaler and inhale daily.  Patient not taking: Reported on 03/29/2021   Silverio Decamp, MD Not Taking Active             Patient Active Problem List   Diagnosis Date Noted   Benign essential tremor  01/20/2021   Obstructive sleep apnea 10/22/2020   Generalized anxiety disorder 10/22/2020   Seborrheic keratosis 04/25/2019   Dupuytrens contracture 10/22/2018   Normocytic anemia 05/04/2018   Vertigo 05/02/2018   Dry skin 08/22/2017   Right cervical radiculopathy 06/27/2016   Ocular migraine 02/23/2016   Benign prostate hyperplasia 12/29/2015   Chronic cough 11/23/2015   CAD (coronary artery disease) 07/22/2014   Annual physical exam 11/07/2012   Insomnia 11/07/2012   Perennial allergic rhinitis 11/07/2012   H/O gastric bypass 10/09/2012   Erectile dysfunction associated with type 2 diabetes mellitus ([place]) 11/14/2011   S/P stroke due to cerebrovascular disease 11/14/2011   Diabetes mellitus type 2, controlled ([place]) 11/14/2011    Immunization History  Administered Date(s) Administered   Fluad Quad(high Dose 65+) 04/25/2019, 05/05/2020   Influenza Split 06/20/2012  Influenza, High Dose Seasonal PF 06/06/2018   Influenza,inj,Quad PF,6+ Mos 07/03/2013, 06/18/2017   PFIZER(Purple Top)SARS-COV-2 Vaccination 10/05/2019, 10/28/2019, 06/14/2020   Pneumococcal Polysaccharide-23 11/07/2012, 06/06/2018   Tdap 08/14/2010, 02/23/2014   Zoster, Live 11/07/2012    Conditions to be addressed/monitored: DMII and essential tremor  Care Plan : Medication Management  Updates made by Darius Bump, [place] since 03/30/2021 12:00 AM     Problem: DM, essential tremor      Long-Range Goal: Disease Progression Prevention   Start Date: 03/29/2021  This Visit's Progress: On track  Priority: High  Note:   Current Barriers:  None at present   Pharmacist Clinical Goal(s):  Over the next 90 days, patient will maintain control of chronic conditions as evidenced by medication fill history, lab values, and vital signs  through collaboration with PharmD and provider.   Interventions: 1:1 collaboration with Silverio Decamp, MD regarding development and update of comprehensive plan of care as  evidenced by provider attestation and co-signature Inter-disciplinary care team collaboration (see longitudinal plan of care) Comprehensive medication review performed; medication list updated in electronic medical record  Diabetes:  Controlled; current treatment: metformin 1g daily; a1c 6.3  Current glucose readings: not currently checking   Denies hypoglycemic/hyperglycemic symptoms  Current meal patterns: breakfast: coffee, doesn't eat; lunch: 1/2 cheeseburger; dinner: meat +veggies from restaurant and takes back to work (2nd shift); snacks: banana, grapes; drinks: water, sweet tea (small portion, occasional soda (2 per week)  Current exercise: still works full time as Cytogeneticist, 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 Recommended continue current regimen   Patient Goals/Self-Care Activities Over the next 90 days, patient will:  take medications as prescribed  Follow Up Plan: Telephone follow up appointment with care management team member scheduled for:  3 months      Medication Assistance: None required.  Patient affirms current coverage meets needs.  [id number] [place] [address]Phone: [phone]: [phone] Uses pill box? No - leaves in original bottles, keeps AM medicine one side of counter, PM on other side. Working well for him. Pt endorses 100% compliance  Follow Up:  Patient agrees to Care Plan and Follow-up.  Plan: Telephone follow up appointment with care management team member scheduled for:  3 months  KDarius Bump

## 2021-03-30 NOTE — Patient Instructions (Signed)
Visit Information   PATIENT GOALS:   Goals Addressed             This Visit's Progress    Medication Management       Patient Goals/Self-Care Activities Over the next 90 days, patient will:  take medications as prescribed  Follow Up Plan: Telephone follow up appointment with care management team member scheduled for:  3 months         Consent to CCM Services: James Frye was given information about Chronic Care Management services including:  CCM service includes personalized support from designated clinical staff supervised by his physician, including individualized plan of care and coordination with other care providers 24/7 contact phone numbers for assistance for urgent and routine care needs. Service will only be billed when office clinical staff spend 20 minutes or more in a month to coordinate care. Only one practitioner may furnish and bill the service in a calendar month. The patient may stop CCM services at any time (effective at the end of the month) by phone call to the office staff. The patient will be responsible for cost sharing (co-pay) of up to 20% of the service fee (after annual deductible is met).  Patient agreed to services and verbal consent obtained.   Patient verbalizes understanding of instructions provided today and agrees to view in [place].   Telephone follow up appointment with care management team member scheduled for: 3 months  James Frye   CLINICAL CARE PLAN: Patient Care Plan: Medication Management     Problem Identified: DM, essential tremor      Long-Range Goal: Disease Progression Prevention   Start Date: 03/29/2021  This Visit's Progress: On track  Priority: High  Note:   Current Barriers:  None at present   Pharmacist Clinical Goal(s):  Over the next 90 days, patient will maintain control of chronic conditions as evidenced by medication fill history, lab values, and vital signs  through collaboration with PharmD and  provider.   Interventions: 1:1 collaboration with James Decamp, MD regarding development and update of comprehensive plan of care as evidenced by provider attestation and co-signature Inter-disciplinary care team collaboration (see longitudinal plan of care) Comprehensive medication review performed; medication list updated in electronic medical record  Diabetes:  Controlled; current treatment: metformin 1g daily; a1c 6.3  Current glucose readings: not currently checking   Denies hypoglycemic/hyperglycemic symptoms  Current meal patterns: breakfast: coffee, doesn't eat; lunch: 1/2 cheeseburger; dinner: meat +veggies from restaurant and takes back to work (2nd shift); snacks: banana, grapes; drinks: water, sweet tea (small portion, occasional soda (2 per week)  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 Recommended continue current regimen   Patient Goals/Self-Care Activities Over the next 90 days, patient will:  take medications as prescribed  Follow Up Plan: Telephone follow up appointment with care management team member scheduled for:  3 months

## 2021-03-31 NOTE — Addendum Note (Signed)
Addended by: Silverio Decamp on: 03/31/2021 09:33 AM   Modules accepted: Level of Service

## 2021-04-10 DIAGNOSIS — R0681 Apnea, not elsewhere classified: Secondary | ICD-10-CM | POA: Diagnosis not present

## 2021-04-10 DIAGNOSIS — G4733 Obstructive sleep apnea (adult) (pediatric): Secondary | ICD-10-CM | POA: Diagnosis not present

## 2021-04-19 ENCOUNTER — Other Ambulatory Visit: Payer: Self-pay

## 2021-04-19 ENCOUNTER — Other Ambulatory Visit: Payer: Self-pay | Admitting: Medical-Surgical

## 2021-04-19 ENCOUNTER — Ambulatory Visit (INDEPENDENT_AMBULATORY_CARE_PROVIDER_SITE_OTHER): Payer: Medicare HMO | Admitting: Sports Medicine

## 2021-04-19 DIAGNOSIS — F411 Generalized anxiety disorder: Secondary | ICD-10-CM | POA: Diagnosis not present

## 2021-04-19 DIAGNOSIS — G250 Essential tremor: Secondary | ICD-10-CM

## 2021-04-19 DIAGNOSIS — Z9884 Bariatric surgery status: Secondary | ICD-10-CM

## 2021-04-19 NOTE — Assessment & Plan Note (Signed)
James Frye is post gastric bypass, he historically had done well with weight loss. He does report feeling somewhat off balance, denies any overt numbness and tingling in his legs. He is on oral vitamin B12 supplementation, we have not checked his B12 levels in a long time. 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 In the meantime he plans to get more consistent with supplementation.

## 2021-04-19 NOTE — Assessment & Plan Note (Signed)
James Frye has finally gotten more consistent with his primidone, he has noted his essential tremor has improved and is happy with how things are going. He also reports his anxiety is under control with BuSpar 2 tabs twice a day.

## 2021-04-19 NOTE — Progress Notes (Signed)
    Procedures performed today:    None.  Independent interpretation of notes and tests performed by another provider:   None.  Brief History, Exam, Impression, and Recommendations:    Benign essential tremor James Frye has finally gotten more consistent with his primidone, he has noted his essential tremor has improved and is happy with how things are going. He also reports his anxiety is under control with BuSpar 2 tabs twice a day.  Generalized anxiety disorder Under acceptable control with BuSpar 2 tabs twice a day  H/O gastric bypass James Frye is post gastric bypass, he historically had done well with weight loss. He does report feeling somewhat off balance, denies any overt numbness and tingling in his legs. He is on oral vitamin B12 supplementation, we have not checked his B12 levels in a long time. 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 In the meantime he plans to get more consistent with supplementation.    ___________________________________________ [name]. Dianah Field, M.D., ABFM., CAQSM. Primary Care and [place] Instructor of [place] of [facility] of Medicine

## 2021-04-19 NOTE — Assessment & Plan Note (Signed)
Under acceptable control with BuSpar 2 tabs twice a day

## 2021-04-25 ENCOUNTER — Other Ambulatory Visit: Payer: Self-pay | Admitting: Pulmonary Disease

## 2021-05-02 DIAGNOSIS — E119 Type 2 diabetes mellitus without complications: Secondary | ICD-10-CM

## 2021-05-02 MED ORDER — DEXCOM G6 RECEIVER DEVI: 1.0000 | Freq: Every day | 0 refills | Status: DC

## 2021-05-02 MED ORDER — DEXCOM G6 SENSOR MISC
1.0000 | Freq: Every day | 11 refills | Status: DC
Start: 2021-05-02 — End: 2021-07-13

## 2021-05-02 NOTE — Telephone Encounter (Signed)
I sent a prescription to [facility], hopefully this will go through.  I spent 5 total minutes of online digital evaluation and management services.

## 2021-05-10 ENCOUNTER — Ambulatory Visit (INDEPENDENT_AMBULATORY_CARE_PROVIDER_SITE_OTHER): Payer: Medicare HMO | Admitting: Sports Medicine

## 2021-05-10 ENCOUNTER — Other Ambulatory Visit: Payer: Self-pay

## 2021-05-10 VITALS — BP 119/74 | HR 74 | Ht 67.0 in | Wt 180.0 lb

## 2021-05-10 DIAGNOSIS — E119 Type 2 diabetes mellitus without complications: Secondary | ICD-10-CM | POA: Diagnosis not present

## 2021-05-10 DIAGNOSIS — F5101 Primary insomnia: Secondary | ICD-10-CM | POA: Diagnosis not present

## 2021-05-10 LAB — POCT GLYCOSYLATED HEMOGLOBIN (HGB A1C): HbA1c, POC (controlled diabetic range): 6 % (ref 0.0–7.0)

## 2021-05-10 NOTE — Assessment & Plan Note (Signed)
Persistent insomnia, was doing hydroxyzine, has noted increasing nocturia. He has no dribbling, normal stream, feels as though he empties completely. He does endorse drinking large quantities of liquids before going to bed and is working on this. A1c is 6% today. 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

## 2021-05-10 NOTE — Progress Notes (Signed)
    Procedures performed today:    None.  Independent interpretation of notes and tests performed by another provider:   None.  Brief History, Exam, Impression, and Recommendations:    Insomnia Persistent insomnia, was doing hydroxyzine, has noted increasing nocturia. He has no dribbling, normal stream, feels as though he empties completely. He does endorse drinking large quantities of liquids before going to bed and is working on this. A1c is 6% today. 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  Diabetes mellitus type 2, controlled Currently well controlled with a hemoglobin A1c of 6%, he is try to get a Dexcom blood glucose monitor, for approval purposes he has labile blood sugar, and tests 3-4 times daily.  I spent 30 minutes of total time managing this patient today, this includes chart review, face to face, and non-face to face time.  Specifically we discussed the differential diagnosis for polyuria.  ___________________________________________ [name]. Dianah Field, M.D., ABFM., CAQSM. Primary Care and [place] In[employer] of [employer] [facility]

## 2021-05-10 NOTE — Assessment & Plan Note (Signed)
Currently well controlled with a hemoglobin A1c of 6%, he is try to get a Dexcom blood glucose monitor, for approval purposes he has labile blood sugar, and tests 3-4 times daily.

## 2021-05-11 DIAGNOSIS — G4733 Obstructive sleep apnea (adult) (pediatric): Secondary | ICD-10-CM | POA: Diagnosis not present

## 2021-05-11 DIAGNOSIS — R0681 Apnea, not elsewhere classified: Secondary | ICD-10-CM | POA: Diagnosis not present

## 2021-05-26 ENCOUNTER — Other Ambulatory Visit: Payer: Self-pay

## 2021-05-26 DIAGNOSIS — Z23 Encounter for immunization: Secondary | ICD-10-CM

## 2021-05-26 MED ORDER — ZOSTER VAC RECOMB ADJUVANTED 50 MCG/0.5ML IM SUSR: 0.5000 mL | Freq: Once | INTRAMUSCULAR | 0 refills | Status: AC

## 2021-05-26 NOTE — Progress Notes (Signed)
Order for shingles vaccine sent to [facility]

## 2021-06-03 ENCOUNTER — Telehealth: Payer: Self-pay

## 2021-06-03 NOTE — Telephone Encounter (Signed)
Lynette left msg that they are wanting to do a peer to peer to get more info regarding the approval of the CGM. CASE # [id number]   Called and ok'd the peer to peer and gave them Dr. Mcneil Sober phone number.

## 2021-06-07 NOTE — Telephone Encounter (Signed)
Spoke with patient regarding out of pocket options. Advised him to check with pharmacies in the area to see which glucose monitoring option would be most cost efficient.

## 2021-06-07 NOTE — Telephone Encounter (Signed)
Peer to peer done, Medicare will only cover a continuous glucose monitor for patients on insulin injections at least 3 times a day.  Medicare will not cover it for [facility] however he does have the option to purchase it cash himself.

## 2021-06-10 DIAGNOSIS — R0681 Apnea, not elsewhere classified: Secondary | ICD-10-CM | POA: Diagnosis not present

## 2021-06-10 DIAGNOSIS — G4733 Obstructive sleep apnea (adult) (pediatric): Secondary | ICD-10-CM | POA: Diagnosis not present

## 2021-06-22 ENCOUNTER — Other Ambulatory Visit: Payer: Self-pay

## 2021-06-22 DIAGNOSIS — G250 Essential tremor: Secondary | ICD-10-CM

## 2021-06-22 MED ORDER — PRIMIDONE 50 MG PO TABS: 50.0000 mg | ORAL_TABLET | Freq: Every day | ORAL | 3 refills | Status: DC

## 2021-06-30 ENCOUNTER — Ambulatory Visit (INDEPENDENT_AMBULATORY_CARE_PROVIDER_SITE_OTHER): Payer: Medicare HMO | Admitting: Pharmacist

## 2021-06-30 ENCOUNTER — Other Ambulatory Visit: Payer: Self-pay

## 2021-06-30 DIAGNOSIS — E119 Type 2 diabetes mellitus without complications: Secondary | ICD-10-CM

## 2021-06-30 DIAGNOSIS — G250 Essential tremor: Secondary | ICD-10-CM

## 2021-06-30 NOTE — Progress Notes (Signed)
Chronic Care Management Pharmacy Note  07/01/2021 Name:  James Frye MRN:  [id number] DOB:  02/18/1951  Summary: addressed DM, tremor. Patient has tried to get CGM and desires to know more about his glucose control, but Dexcom rejected through DME due to not meeting eligibility requirements of 3+ injections/day.   Recommendations/Changes made from today's visit: offered patient a Freestyle Libre 3 sample sensor, and explained the option of a maximum of $75/month for it to be sent through his pharmacy if he really enjoys it and finds this cost to be worth it. Patient was very interested.  Plan: f/u with pharmacist in 2-3 weeks for onsite appointment to apply CGM sample.   Subjective: James Frye is an 70 y.o. year old male who is a primary patient of [name], [name], MD.  The CCM team was consulted for assistance with disease management and care coordination needs.    Engaged with patient by telephone for follow up visit in response to provider referral for pharmacy case management and/or care coordination services.   Consent to Services:  The patient was given information about Chronic Care Management services, agreed to services, and gave verbal consent prior to initiation of services.  Please see initial visit note for detailed documentation.   Patient Care Team: James Decamp, MD as PCP - General (Family Medicine) James Frye, [facility] as Pharmacist (Pharmacist)  Recent office visits:  02/23/21 James Bouche NP- Patient seen for a Benign essential tremor. Patient started Buspirone HCI 5-10 MG daily. Return for mood and essential tremor follow up with Dr. Darene Frye in 2-3 weeks.   02/18/21 James Mems MD(PCP)- Patient was seen for Benign essential tremor. Patient Primidone increased to 50 mg daily. Follow up in 4 weeks.   01/20/21 James Mems MD- Patient was seen for Benign essential tremor. Patient started on Primidone 25 MD at bedtime. Follow up in 4 weeks.    12/28/20 James Bouche NP- Patient was seen for Rash. Labs were ordered and Taper of Prednisone 60 MG was started. Follow up if symptoms worsen.   12/03/20 James Mems MD- Patient was seen for Generalized anxiety disorder. Follow up in February 2023 for annual physical.   10/22/20 James Mems MD- Patient was seen for Chronic Cough. Patient preferred to discontinue Fluticasone. Referral for Behavioral Health was placed. Follow up in 6 months.   Recent consult visits:  10/28/20 James Garfinkel MD (Pulm)- Patient was seen for Chronic cough. Follow up in 6 months.   Hospital visits:  None in previous 6 months  Objective:  Lab Results  Component Value Date   CREATININE 1.36 (H) 09/24/2020   CREATININE 1.46 (H) 05/28/2020   CREATININE 1.38 (H) 09/25/2019    Lab Results  Component Value Date   HGBA1C 6.0 05/10/2021   Last diabetic Eye exam:  Lab Results  Component Value Date/Time   HMDIABEYEEXA No Retinopathy 04/14/2019 12:00 AM       Component Value Date/Time   CHOL 155 09/24/2020 0939   TRIG 56 09/24/2020 0939   HDL 91 09/24/2020 0939   CHOLHDL 1.7 09/24/2020 0939   VLDL 9 06/27/2016 0839   LDLCALC 50 09/24/2020 0939    Hepatic Function Latest Ref Rng & Units 09/24/2020 05/28/2020 09/25/2019  Total Protein 6.1 - 8.1 g/dL 6.9 7.7 7.3  Albumin 3.6 - 5.1 g/dL - - -  AST 10 - 35 U/L _0 ALT 9 - 46 U/L _1 Alk Phosphatase 40 - 115 U/L - - -  Total Bilirubin 0.2 - 1.2 mg/dL 0.5 0.7 0.5    Lab Results  Component Value Date/Time   TSH 0.87 05/28/2020 10:54 AM   TSH 1.19 01/30/2019 09:26 AM   FREET4 1.16 10/25/2012 02:31 PM    CBC Latest Ref Rng & Units 12/28/2020 09/24/2020 09/08/2020  WBC 3.8 - 10.8 Thousand/uL 4.7 10.5 5.4  Hemoglobin 13.2 - 17.1 g/dL 12.0(L) 12.1(L) 11.6(L)  Hematocrit 38.5 - 50.0 % 37.1(L) 37.0(L) 34.9(L)  Platelets 140 - 400 Thousand/uL 222 268 212.0    Social History   Tobacco Use  Smoking Status Former  Smokeless Tobacco  Never   BP Readings from Last 3 Encounters:  05/10/21 119/74  04/19/21 132/88  03/22/21 111/69   Pulse Readings from Last 3 Encounters:  05/10/21 74  04/19/21 75  03/22/21 63   Wt Readings from Last 3 Encounters:  05/10/21 180 lb (81.6 kg)  04/19/21 182 lb 0.6 oz (82.6 kg)  03/22/21 179 lb (81.2 kg)    Assessment: Review of patient past medical history, allergies, medications, health status, including review of consultants reports, laboratory and other test data, was performed as part of comprehensive evaluation and provision of chronic care management services.   SDOH:  (Social Determinants of Health) assessments and interventions performed:    CCM Care Plan  Allergies  Allergen Reactions   Penicillins Rash    Medications Reviewed Today     Reviewed by James Frye, [facility] (Pharmacist) on 06/30/21 at [place] List Status: <None>   Medication Order Taking? Sig Documenting Provider Last Dose Status Informant  busPIRone (BUSPAR) 5 MG tablet [id number] Yes TAKE 1 TO 2 TABLETS(5 TO 10 MG) BY MOUTH TWICE DAILY James Bouche, NP Taking Active   clobetasol ointment (TEMOVATE) 0.05 % [id number] Yes Apply 1 application topically 2 (two) times daily. James Decamp, MD Taking Active   Continuous Blood Gluc Receiver (James Frye) DEVI [id number] No 1 each by Does not apply route daily.  Patient not taking: Reported on 06/30/2021   James Decamp, MD Not Taking Active   Continuous Blood Gluc Sensor (DEXCOM G6 SENSOR) MISC [id number] No Place 1 each onto the skin daily.  Patient not taking: Reported on 06/30/2021   James Decamp, MD Not Taking Active   hydrOXYzine (ATARAX/VISTARIL) 50 MG tablet [id number] Yes Take 1 tablet (50 mg total) by mouth at bedtime. James Decamp, MD Taking Active   ipratropium (ATROVENT) 0.06 % nasal spray [id number] Yes Place 2 sprays into both nostrils 3 (three) times daily. James Decamp, MD Taking Active             Med Note James Frye   Tue Mar 29, 2021 11:30 AM) Using 2 sprays each nostril once daily  metFORMIN (GLUCOPHAGE) 1000 MG tablet [id number] Yes Take 1 tablet by mouth once daily with breakfast James Decamp, MD Taking Active   primidone (MYSOLINE) 50 MG tablet [id number] Yes Take 1 tablet (50 mg total) by mouth at bedtime. James Decamp, MD Taking Active   rosuvastatin (CRESTOR) 20 MG tablet [id number] Yes Take 1 tablet (20 mg total) by mouth daily. James Decamp, MD Taking Active   tiotropium [facility]) 18 MCG inhalation capsule [id number] No Place 1 capsule (18 mcg total) into inhaler and inhale daily.  Patient not taking: Reported on 06/30/2021   James Decamp, MD Not Taking Active             Patient Active Problem List   Diagnosis  Date Noted   Benign essential tremor 01/20/2021   Obstructive sleep apnea 10/22/2020   Generalized anxiety disorder 10/22/2020   Seborrheic keratosis 04/25/2019   Dupuytrens contracture 10/22/2018   Normocytic anemia 05/04/2018   Vertigo 05/02/2018   Dry skin 08/22/2017   Right cervical radiculopathy 06/27/2016   Ocular migraine 02/23/2016   Benign prostate hyperplasia 12/29/2015   Chronic cough 11/23/2015   CAD (coronary artery disease) 07/22/2014   Annual physical exam 11/07/2012   Insomnia 11/07/2012   Perennial allergic rhinitis 11/07/2012   H/O gastric bypass 10/09/2012   Erectile dysfunction associated with type 2 diabetes mellitus ([place]) 11/14/2011   S/P stroke due to cerebrovascular disease 11/14/2011   Diabetes mellitus type 2, controlled ([place]) 11/14/2011    Immunization History  Administered Date(s) Administered   Fluad Quad(high Dose 65+) 04/25/2019, 05/05/2020   Influenza Split 06/20/2012   Influenza, High Dose Seasonal PF 06/06/2018   Influenza,inj,Quad PF,6+ Mos 07/03/2013, 06/18/2017   PFIZER(Purple Top)SARS-COV-2 Vaccination 10/05/2019, 10/28/2019, 06/14/2020   Pneumococcal  Polysaccharide-23 11/07/2012, 06/06/2018   Tdap 08/14/2010, 02/23/2014   Zoster Recombinat (Shingrix) 02/01/2021, 05/19/2021   Zoster, Live 11/07/2012    Conditions to be addressed/monitored: DMII and essential tremor  Care Plan : Medication Management  Updates made by James Frye, [place] since 07/01/2021 12:00 AM     Problem: DM, essential tremor      Long-Range Goal: Disease Progression Prevention   Start Date: 03/29/2021  Recent Progress: On track  Priority: High  Note:   Current Barriers:  None at present   Pharmacist Clinical Goal(s):  Over the next 90 days, patient will maintain control of chronic conditions as evidenced by medication fill history, lab values, and vital signs  through collaboration with PharmD and provider.   Interventions: 1:1 collaboration with James Decamp, MD regarding development and update of comprehensive plan of care as evidenced by provider attestation and co-signature Inter-disciplinary care team collaboration (see longitudinal plan of care) Comprehensive medication review performed; medication list updated in electronic medical record  Diabetes:  Controlled; current treatment: metformin 1g daily; a1c 6.3  Current glucose readings: not currently checking   Denies hypoglycemic/hyperglycemic symptoms  Current meal patterns: breakfast: coffee, doesn't eat; lunch: 1/2 cheeseburger; dinner: meat +veggies from restaurant and takes back to work (2nd shift); snacks: banana, grapes; drinks: water, sweet tea (small portion, occasional soda (2 per week)  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 Recommended continue current regimen   Patient  Goals/Self-Care Activities Over the next 30 days, patient will:  take medications as prescribed  Follow Up Plan: Face to face follow up appointment with care management team member scheduled for: 2-3 weeks      Medication Assistance: None required.  Patient affirms current coverage meets needs.  [id number] [place] [address]Phone: [phone]: [phone] [address] [id number]: [phone]: [phone] Uses pill box? No - leaves in original bottles, keeps AM medicine one side of counter, PM on other side. Working well for him. Pt  endorses 100% compliance  Follow Up:  Patient agrees to Care Plan and Follow-up.  Plan: Face to Face appointment with care management team member scheduled for: 2-3 weeks  Larinda Buttery, PharmD Clinical Pharmacist [facility] [phone]

## 2021-07-01 NOTE — Patient Instructions (Signed)
Visit Information  Thank you for taking time to visit with me today. Please don't hesitate to contact me if I can be of assistance to you before our next scheduled telephone appointment.  Face to Face appointment with care management team member scheduled for:  2-3 weeks  If you need to cancel or re-schedule our visit, please call [phone] and our care guide team will be happy to assist you.  Following is a list of the goals we discussed today:  Patient Goals/Self-Care Activities Over the next 90 days, patient will:  take medications as prescribed  Follow Up Plan: Face to face follow up appointment with care management team member scheduled for:  2-3 weeks  Patient verbalizes understanding of instructions provided today and agrees to view in [place].   James Frye

## 2021-07-13 ENCOUNTER — Ambulatory Visit: Payer: Medicare HMO | Admitting: Pharmacist

## 2021-07-13 ENCOUNTER — Other Ambulatory Visit: Payer: Self-pay

## 2021-07-13 ENCOUNTER — Telehealth: Payer: Self-pay | Admitting: Sports Medicine

## 2021-07-13 DIAGNOSIS — E119 Type 2 diabetes mellitus without complications: Secondary | ICD-10-CM

## 2021-07-13 DIAGNOSIS — Z7984 Long term (current) use of oral hypoglycemic drugs: Secondary | ICD-10-CM

## 2021-07-13 DIAGNOSIS — G250 Essential tremor: Secondary | ICD-10-CM

## 2021-07-13 MED ORDER — FREESTYLE LIBRE 3 SENSOR MISC: 1.0000 | Freq: Three times a day (TID) | 11 refills | Status: AC

## 2021-07-13 NOTE — Progress Notes (Signed)
Chronic Care Management Pharmacy Note  07/13/2021 Name:  DSHAUN REPPUCCI MRN:  [id number] DOB:  23-Jun-1951  Summary: addressed DM, tremor. Patient has tried to get CGM but rejected through DME due to stringent eligibility requirements.   Recommendations/Changes made from today's visit: provided patient with a Freestyle Libre 3 sample sensor, reviewed application technique and mobile app navigation, he is excited and requesting RX sent to pharmacy for ongoing use.   Plan: f/u with pharmacist in 1 month to review BG readings with patient  Subjective: DERL ABALOS is an 70 y.o. year old male who is a primary patient of [name], [name], MD.  The CCM team was consulted for assistance with disease management and care coordination needs.    Engaged with patient by telephone for follow up visit in response to provider referral for pharmacy case management and/or care coordination services.   Consent to Services:  The patient was given information about Chronic Care Management services, agreed to services, and gave verbal consent prior to initiation of services.  Please see initial visit note for detailed documentation.   Patient Care Team: Silverio Decamp, MD as PCP - General (Family Medicine) Darius Bump, [facility] as Pharmacist (Pharmacist)  Recent office visits:  02/23/21 [name] NP- Patient seen for a Benign essential tremor. Patient started Buspirone HCI 5-10 MG daily. Return for mood and essential tremor follow up with Dr. Darene Lamer in 2-3 weeks.   02/18/21 [name] MD(PCP)- Patient was seen for Benign essential tremor. Patient Primidone increased to 50 mg daily. Follow up in 4 weeks.   01/20/21 [name] MD- Patient was seen for Benign essential tremor. Patient started on Primidone 25 MD at bedtime. Follow up in 4 weeks.   12/28/20 [name] NP- Patient was seen for Rash. Labs were ordered and Taper of Prednisone 60 MG was started. Follow up if symptoms  worsen.   12/03/20 [name] MD- Patient was seen for Generalized anxiety disorder. Follow up in February 2023 for annual physical.   10/22/20 [name] MD- Patient was seen for Chronic Cough. Patient preferred to discontinue Fluticasone. Referral for Behavioral Health was placed. Follow up in 6 months.   Recent consult visits:  10/28/20 [name] MD (Pulm)- Patient was seen for Chronic cough. Follow up in 6 months.   Hospital visits:  None in previous 6 months  Objective:  Lab Results  Component Value Date   CREATININE 1.36 (H) 09/24/2020   CREATININE 1.46 (H) 05/28/2020   CREATININE 1.38 (H) 09/25/2019    Lab Results  Component Value Date   HGBA1C 6.0 05/10/2021   Last diabetic Eye exam:  Lab Results  Component Value Date/Time   HMDIABEYEEXA No Retinopathy 04/14/2019 12:00 AM       Component Value Date/Time   CHOL 155 09/24/2020 0939   TRIG 56 09/24/2020 0939   HDL 91 09/24/2020 0939   CHOLHDL 1.7 09/24/2020 0939   VLDL 9 06/27/2016 0839   LDLCALC 50 09/24/2020 0939    Hepatic Function Latest Ref Rng & Units 09/24/2020 05/28/2020 09/25/2019  Total Protein 6.1 - 8.1 g/dL 6.9 7.7 7.3  Albumin 3.6 - 5.1 g/dL - - -  AST 10 - 35 U/L $Remo'12 14 11  'yJdLx$ ALT 9 - 46 U/L $Remo'14 11 11  'SAsmM$ Alk Phosphatase 40 - 115 U/L - - -  Total Bilirubin 0.2 - 1.2 mg/dL 0.5 0.7 0.5    Lab Results  Component Value Date/Time   TSH 0.87 05/28/2020 10:54 AM  TSH 1.19 01/30/2019 09:26 AM   FREET4 1.16 10/25/2012 02:31 PM    CBC Latest Ref Rng & Units 12/28/2020 09/24/2020 09/08/2020  WBC 3.8 - 10.8 Thousand/uL 4.7 10.5 5.4  Hemoglobin 13.2 - 17.1 g/dL 12.0(L) 12.1(L) 11.6(L)  Hematocrit 38.5 - 50.0 % 37.1(L) 37.0(L) 34.9(L)  Platelets 140 - 400 Thousand/uL 222 268 212.0    Social History   Tobacco Use  Smoking Status Former  Smokeless Tobacco Never   BP Readings from Last 3 Encounters:  05/10/21 119/74  04/19/21 132/88  03/22/21 111/69   Pulse Readings from Last 3  Encounters:  05/10/21 74  04/19/21 75  03/22/21 63   Wt Readings from Last 3 Encounters:  05/10/21 180 lb (81.6 kg)  04/19/21 182 lb 0.6 oz (82.6 kg)  03/22/21 179 lb (81.2 kg)    Assessment: Review of patient past medical history, allergies, medications, health status, including review of consultants reports, laboratory and other test data, was performed as part of comprehensive evaluation and provision of chronic care management services.   SDOH:  (Social Determinants of Health) assessments and interventions performed:    CCM Care Plan  Allergies  Allergen Reactions   Penicillins Rash    Medications Reviewed Today     Reviewed by Gabriel Carina, [facility] (Pharmacist) on 07/13/21 at 0959  Med List Status: <None>   Medication Order Taking? Sig Documenting Provider Last Dose Status Informant  busPIRone (BUSPAR) 5 MG tablet [id number] Yes TAKE 1 TO 2 TABLETS(5 TO 10 MG) BY MOUTH TWICE DAILY Christen Butter, NP Taking Active   clobetasol ointment (TEMOVATE) 0.05 % [id number] Yes Apply 1 application topically 2 (two) times daily. [name], MD Taking Active   Continuous Blood Gluc Receiver (DEXCOM G6 RECEIVER) DEVI [id number] No 1 each by Does not apply route daily.  Patient not taking: Reported on 07/13/2021   [name], MD Not Taking Active   Continuous Blood Gluc Sensor (DEXCOM G6 SENSOR) MISC [id number] No Place 1 each onto the skin daily.  Patient not taking: Reported on 06/30/2021   [name], MD Not Taking Active   hydrOXYzine (ATARAX/VISTARIL) 50 MG tablet [id number] Yes Take 1 tablet (50 mg total) by mouth at bedtime. [name], MD Taking Active   ipratropium (ATROVENT) 0.06 % nasal spray [id number] Yes Place 2 sprays into both nostrils 3 (three) times daily. [name], MD Taking Active            Med Note Gabriel Carina   Tue Mar 29, 2021 11:30 AM) Using 2 sprays each nostril once daily  metFORMIN (GLUCOPHAGE) 1000 MG  tablet [id number] Yes Take 1 tablet by mouth once daily with breakfast [name], MD Taking Active   primidone (MYSOLINE) 50 MG tablet [id number] Yes Take 1 tablet (50 mg total) by mouth at bedtime. [name], MD Taking Active   rosuvastatin (CRESTOR) 20 MG tablet [id number] Yes Take 1 tablet (20 mg total) by mouth daily. [name], MD Taking Active   tiotropium [facility]) 18 MCG inhalation capsule [id number] No Place 1 capsule (18 mcg total) into inhaler and inhale daily.  Patient not taking: Reported on 06/30/2021   [name], MD Not Taking Active             Patient Active Problem List   Diagnosis Date Noted   Benign essential tremor 01/20/2021   Obstructive sleep apnea 10/22/2020   Generalized anxiety disorder 10/22/2020   Seborrheic keratosis 04/25/2019  Dupuytrens contracture 10/22/2018   Normocytic anemia 05/04/2018   Vertigo 05/02/2018   Dry skin 08/22/2017   Right cervical radiculopathy 06/27/2016   Ocular migraine 02/23/2016   Benign prostate hyperplasia 12/29/2015   Chronic cough 11/23/2015   CAD (coronary artery disease) 07/22/2014   Annual physical exam 11/07/2012   Insomnia 11/07/2012   Perennial allergic rhinitis 11/07/2012   H/O gastric bypass 10/09/2012   Erectile dysfunction associated with type 2 diabetes mellitus ([place]) 11/14/2011   S/P stroke due to cerebrovascular disease 11/14/2011   Diabetes mellitus type 2, controlled ([place]) 11/14/2011    Immunization History  Administered Date(s) Administered   Fluad Quad(high Dose 65+) 04/25/2019, 05/05/2020   Influenza Split 06/20/2012   Influenza, High Dose Seasonal PF 06/06/2018   Influenza,inj,Quad PF,6+ Mos 07/03/2013, 06/18/2017   PFIZER(Purple Top)SARS-COV-2 Vaccination 10/05/2019, 10/28/2019, 06/14/2020   Pneumococcal Polysaccharide-23 11/07/2012, 06/06/2018   Tdap 08/14/2010, 02/23/2014   Zoster Recombinat (Shingrix) 02/01/2021, 05/19/2021   Zoster, Live  11/07/2012    Conditions to be addressed/monitored: DMII and essential tremor  Care Plan : Medication Management  Updates made by Darius Bump, [place] since 07/13/2021 12:00 AM     Problem: DM, essential tremor      Long-Range Goal: Disease Progression Prevention   Start Date: 03/29/2021  Recent Progress: On track  Priority: High  Note:   Current Barriers:  None at present   Pharmacist Clinical Goal(s):  Over the next 90 days, patient will maintain control of chronic conditions as evidenced by medication fill history, lab values, and vital signs  through collaboration with PharmD and provider.   Interventions: 1:1 collaboration with Silverio Decamp, MD regarding development and update of comprehensive plan of care as evidenced by provider attestation and co-signature Inter-disciplinary care team collaboration (see longitudinal plan of care) Comprehensive medication review performed; medication list updated in electronic medical record  Diabetes:  Controlled; current treatment: metformin 1g daily; a1c 6.3  Current glucose readings: not currently checking   Denies hypoglycemic/hyperglycemic symptoms  Current meal patterns: breakfast: coffee, doesn't eat; lunch: 1/2 cheeseburger; dinner: meat +veggies from restaurant and takes back to work (2nd shift); snacks: banana, grapes; drinks: water, sweet tea (small portion, occasional soda (2 per week)  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 Recommended continue current regimen   Patient Goals/Self-Care Activities Over the next 30 days, patient will:  take  medications as prescribed  Follow Up Plan: Telephone follow up appointment with care management team member scheduled for: 1 month       Medication Assistance: None required.  Patient affirms current coverage meets needs.  Patient's preferred pharmacy is:  [id number] - [place], [place] - [address] [place] [place] [address] Phone: [phone] Fax: [phone]  [place], [place] [address] Phone: [phone] Fax: [phone]  Uses pill box? No - leaves in original bottles, keeps AM medicine one side of counter, PM on other side. Working well for him. Pt endorses 100% compliance  Follow Up:  Patient agrees to Care Plan and Follow-up.  Plan: Telephone follow up appointment with care  management team member scheduled for:  1 month  Larinda Buttery, PharmD Clinical Pharmacist [facility] [phone]

## 2021-07-13 NOTE — Telephone Encounter (Signed)
-----   Message from Darius Bump, [facility] sent at 07/13/2021  4:09 PM EST ----- Yes - I am anticipating (& really hoping!) better luck with Elenor Legato 3 based on what rep is telling me about her collaboration for access & approval in the local retail pharmacies. I could be completely wrong haha. But we will see, fingers crossed. Even if it does not run through his insurance - the maximum should be $75 per month (using cash pay), which he states he is wanting to pay. So I think he will be able to obtain it using cash pay if we have trouble through insurance.  Thank you!  ----- Message ----- From: Silverio Decamp, MD Sent: 07/13/2021  11:35 AM EST To: Darius Bump, RPH  I can do this but we tried to get him the Dexcom, I went through the peer-to-peer process and it was denied, do think there will be better luck with the [place] 3? ___________________________________________ [name]. Dianah Field, M.D., ABFM., CAQSM. Primary Care and Sports Medicine [facility]  Adjunct Professor of [employer] [employer] of Medicine ----- Message ----- From: Darius Bump, [facility] Sent: 07/13/2021  11:20 AM EST To: Silverio Decamp, MD  Dr. Darene Lamer - can you please send RX for [place] 3 sensor to his regular pharmacy in chart? Thank you so much! Lysle Morales

## 2021-07-13 NOTE — Patient Instructions (Signed)
Visit Information  Thank you for taking time to visit with me today. Please don't hesitate to contact me if I can be of assistance to you before our next scheduled telephone appointment.  Following are the goals we discussed today:   Patient Goals/Self-Care Activities Over the next 30 days, patient will:  take medications as prescribed, utilize CGM sensor to gain insight on food-glucose relationship  Follow Up Plan: Telephone follow up appointment with care management team member scheduled for: 1 month  Please call the care guide team at [phone] if you need to cancel or reschedule your appointment.   Patient verbalizes understanding of instructions provided today and agrees to view in [place].   James Frye

## 2021-07-13 NOTE — Assessment & Plan Note (Addendum)
James Frye is a pleasant 70 year old male with diabetes, A1c of 6%, we have tried to get his Dexcom continuous blood sugar monitor approved and unfortunately it was not, on discussion with our pharmacist the freestyle libre 3 may be better covered, we will try sending this to the pharmacy.

## 2021-07-13 NOTE — Addendum Note (Signed)
Addended by: Silverio Decamp on: 07/13/2021 04:26 PM   Modules accepted: Orders

## 2021-07-19 ENCOUNTER — Encounter: Payer: Self-pay | Admitting: Sports Medicine

## 2021-07-20 ENCOUNTER — Encounter: Payer: Self-pay | Admitting: Pharmacist

## 2021-07-20 ENCOUNTER — Ambulatory Visit: Payer: Medicare HMO | Admitting: Pharmacist

## 2021-07-20 ENCOUNTER — Other Ambulatory Visit: Payer: Self-pay

## 2021-07-20 DIAGNOSIS — E119 Type 2 diabetes mellitus without complications: Secondary | ICD-10-CM

## 2021-07-20 DIAGNOSIS — G250 Essential tremor: Secondary | ICD-10-CM

## 2021-07-20 NOTE — Patient Instructions (Signed)
Visit Information ° °Thank you for taking time to visit with me today. Please don't hesitate to contact me if I can be of assistance to you before our next scheduled telephone appointment. ° °Following are the goals we discussed today:  °Patient Goals/Self-Care Activities °Over the next 30 days, patient will:  °take medications as prescribed ° °Follow Up Plan: Telephone follow up appointment with care management team member scheduled for:  1 month  ° ° °Please call the care guide team at [phone] if you need to cancel or reschedule your appointment.  ° ° ° °Patient verbalizes understanding of instructions provided today and agrees to view in MyChart.  ° °James Frye ° °

## 2021-07-20 NOTE — Progress Notes (Signed)
Chronic Care Management Pharmacy Note  07/20/2021 Name:  James Frye MRN:  [id number] DOB:  10/27/1950  Summary: addressed DM and Libre 3. Patient submitted mychart message, he is enjoying Libre 3 sample but requesting advice for nocturnal hypoglycemia. Also received a coverage denial letter from [employer].  Recommendations/Changes made from today's visit:  - coordinated with pharmacy to confirm cash price is $74.75 for monthly supply & this is an option for patient to continue access if he desires - advised patient can avoid nocturnal hypoglycemia by having a snack prior to bedtime, and can turn off BG <70 alarm, alternatively can turn off bluetooth on his cell phone if he wishes to have the device fully offline overnight for improved sleep  Plan: f/u with pharmacist at end of month as originally planned  Subjective: James Frye is an 70 y.o. year old male who is a primary patient of [name], [name], MD.  The CCM team was consulted for assistance with disease management and care coordination needs.    Engaged with patient by telephone for follow up visit in response to provider referral for pharmacy case management and/or care coordination services.   Consent to Services:  The patient was given information about Chronic Care Management services, agreed to services, and gave verbal consent prior to initiation of services.  Please see initial visit note for detailed documentation.   Patient Care Team: Silverio Decamp, MD as PCP - General (Family Medicine) Darius Bump, [facility] as Pharmacist (Pharmacist)  Recent office visits:  02/23/21 [name] NP- Patient seen for a Benign essential tremor. Patient started Buspirone HCI 5-10 MG daily. Return for mood and essential tremor follow up with Dr. Darene Lamer in 2-3 weeks.   02/18/21 [name] MD(PCP)- Patient was seen for Benign essential tremor. Patient Primidone increased to 50 mg daily. Follow up in 4 weeks.    01/20/21 [name] MD- Patient was seen for Benign essential tremor. Patient started on Primidone 25 MD at bedtime. Follow up in 4 weeks.   12/28/20 [name] NP- Patient was seen for Rash. Labs were ordered and Taper of Prednisone 60 MG was started. Follow up if symptoms worsen.   12/03/20 [name] MD- Patient was seen for Generalized anxiety disorder. Follow up in February 2023 for annual physical.   10/22/20 [name] MD- Patient was seen for Chronic Cough. Patient preferred to discontinue Fluticasone. Referral for Behavioral Health was placed. Follow up in 6 months.   Recent consult visits:  10/28/20 [name] MD (Pulm)- Patient was seen for Chronic cough. Follow up in 6 months.   Hospital visits:  None in previous 6 months  Objective:  Lab Results  Component Value Date   CREATININE 1.36 (H) 09/24/2020   CREATININE 1.46 (H) 05/28/2020   CREATININE 1.38 (H) 09/25/2019    Lab Results  Component Value Date   HGBA1C 6.0 05/10/2021   Last diabetic Eye exam:  Lab Results  Component Value Date/Time   HMDIABEYEEXA No Retinopathy 04/14/2019 12:00 AM       Component Value Date/Time   CHOL 155 09/24/2020 0939   TRIG 56 09/24/2020 0939   HDL 91 09/24/2020 0939   CHOLHDL 1.7 09/24/2020 0939   VLDL 9 06/27/2016 0839   LDLCALC 50 09/24/2020 0939    Hepatic Function Latest Ref Rng & Units 09/24/2020 05/28/2020 09/25/2019  Total Protein 6.1 - 8.1 g/dL 6.9 7.7 7.3  Albumin 3.6 - 5.1 g/dL - - -  AST 10 - 35  U/L '12 14 11  '$ ALT 9 - 46 U/L $Remo'14 11 11  'ECdfb$ Alk Phosphatase 40 - 115 U/L - - -  Total Bilirubin 0.2 - 1.2 mg/dL 0.5 0.7 0.5    Lab Results  Component Value Date/Time   TSH 0.87 05/28/2020 10:54 AM   TSH 1.19 01/30/2019 09:26 AM   FREET4 1.16 10/25/2012 02:31 PM    CBC Latest Ref Rng & Units 12/28/2020 09/24/2020 09/08/2020  WBC 3.8 - 10.8 Thousand/uL 4.7 10.5 5.4  Hemoglobin 13.2 - 17.1 g/dL 12.0(L) 12.1(L) 11.6(L)  Hematocrit 38.5 - 50.0 %  37.1(L) 37.0(L) 34.9(L)  Platelets 140 - 400 Thousand/uL 222 268 212.0    Social History   Tobacco Use  Smoking Status Former  Smokeless Tobacco Never   BP Readings from Last 3 Encounters:  05/10/21 119/74  04/19/21 132/88  03/22/21 111/69   Pulse Readings from Last 3 Encounters:  05/10/21 74  04/19/21 75  03/22/21 63   Wt Readings from Last 3 Encounters:  05/10/21 180 lb (81.6 kg)  04/19/21 182 lb 0.6 oz (82.6 kg)  03/22/21 179 lb (81.2 kg)    Assessment: Review of patient past medical history, allergies, medications, health status, including review of consultants reports, laboratory and other test data, was performed as part of comprehensive evaluation and provision of chronic care management services.   SDOH:  (Social Determinants of Health) assessments and interventions performed:    CCM Care Plan  Allergies  Allergen Reactions   Penicillins Rash    Medications Reviewed Today     Reviewed by Darius Bump, [facility] (Pharmacist) on 07/13/21 at 0959  Med List Status: <None>   Medication Order Taking? Sig Documenting Provider Last Dose Status Informant  busPIRone (BUSPAR) 5 MG tablet [id number] Yes TAKE 1 TO 2 TABLETS(5 TO 10 MG) BY MOUTH TWICE DAILY [name], NP Taking Active   clobetasol ointment (TEMOVATE) 0.05 % [id number] Yes Apply 1 application topically 2 (two) times daily. Silverio Decamp, MD Taking Active   Continuous Blood Gluc Receiver ([place]) DEVI [id number] No 1 each by Does not apply route daily.  Patient not taking: Reported on 07/13/2021   Silverio Decamp, MD Not Taking Active   Continuous Blood Gluc Sensor (DEXCOM G6 SENSOR) MISC [id number] No Place 1 each onto the skin daily.  Patient not taking: Reported on 06/30/2021   Silverio Decamp, MD Not Taking Active   hydrOXYzine (ATARAX/VISTARIL) 50 MG tablet [id number] Yes Take 1 tablet (50 mg total) by mouth at bedtime. Silverio Decamp, MD Taking Active    ipratropium (ATROVENT) 0.06 % nasal spray [id number] Yes Place 2 sprays into both nostrils 3 (three) times daily. Silverio Decamp, MD Taking Active            Med Note Darius Bump   Tue Mar 29, 2021 11:30 AM) Using 2 sprays each nostril once daily  metFORMIN (GLUCOPHAGE) 1000 MG tablet [id number] Yes Take 1 tablet by mouth once daily with breakfast Silverio Decamp, MD Taking Active   primidone (MYSOLINE) 50 MG tablet [id number] Yes Take 1 tablet (50 mg total) by mouth at bedtime. Silverio Decamp, MD Taking Active   rosuvastatin (CRESTOR) 20 MG tablet [id number] Yes Take 1 tablet (20 mg total) by mouth daily. Silverio Decamp, MD Taking Active   tiotropium [facility]) 18 MCG inhalation capsule [id number] No Place 1 capsule (18 mcg total) into inhaler and inhale daily.  Patient not taking: Reported on 06/30/2021   [name]  J, MD Not Taking Active             Patient Active Problem List   Diagnosis Date Noted   Benign essential tremor 01/20/2021   Obstructive sleep apnea 10/22/2020   Generalized anxiety disorder 10/22/2020   Seborrheic keratosis 04/25/2019   Dupuytrens contracture 10/22/2018   Normocytic anemia 05/04/2018   Vertigo 05/02/2018   Dry skin 08/22/2017   Right cervical radiculopathy 06/27/2016   Ocular migraine 02/23/2016   Benign prostate hyperplasia 12/29/2015   Chronic cough 11/23/2015   CAD (coronary artery disease) 07/22/2014   Annual physical exam 11/07/2012   Insomnia 11/07/2012   Perennial allergic rhinitis 11/07/2012   H/O gastric bypass 10/09/2012   Erectile dysfunction associated with type 2 diabetes mellitus ([place]) 11/14/2011   S/P stroke due to cerebrovascular disease 11/14/2011   Diabetes mellitus type 2, controlled ([place]) 11/14/2011    Immunization History  Administered Date(s) Administered   Fluad Quad(high Dose 65+) 04/25/2019, 05/05/2020   Influenza Split 06/20/2012   Influenza, High Dose Seasonal PF  06/06/2018   Influenza,inj,Quad PF,6+ Mos 07/03/2013, 06/18/2017   PFIZER(Purple Top)SARS-COV-2 Vaccination 10/05/2019, 10/28/2019, 06/14/2020   Pneumococcal Polysaccharide-23 11/07/2012, 06/06/2018   Tdap 08/14/2010, 02/23/2014   Zoster Recombinat (Shingrix) 02/01/2021, 05/19/2021   Zoster, Live 11/07/2012    Conditions to be addressed/monitored: DMII and essential tremor  Care Plan : Medication Management  Updates made by Darius Bump, [place] since 07/20/2021 12:00 AM     Problem: DM, essential tremor      Long-Range Goal: Disease Progression Prevention   Start Date: 03/29/2021  Recent Progress: On track  Priority: High  Note:   Current Barriers:  None at present   Pharmacist Clinical Goal(s):  Over the next 30 days, patient will maintain control of chronic conditions as evidenced by medication fill history, lab values, and vital signs  through collaboration with PharmD and provider.   Interventions: 1:1 collaboration with Silverio Decamp, MD regarding development and update of comprehensive plan of care as evidenced by provider attestation and co-signature Inter-disciplinary care team collaboration (see longitudinal plan of care) Comprehensive medication review performed; medication list updated in electronic medical record  Diabetes:  Controlled; current treatment: metformin 1g daily; a1c 6.3  Current glucose readings: not currently checking   Denies hypoglycemic/hyperglycemic symptoms  Current meal patterns: breakfast: coffee, doesn't eat; lunch: 1/2 cheeseburger; dinner: meat +veggies from restaurant and takes back to work (2nd shift); snacks: banana, grapes; drinks: water, sweet tea (small portion, occasional soda (2 per week)  Current exercise: still works full time as Cytogeneticist, walks a Public relations account executive on dietary choices & optimal nutrition for glucose control Recommended continue current regimen, utilize CGM for gaining understanding about food-glucose  relationship  Benign Essential Tremor  Controlled; current treatment: primidone 50mg  nightly  Counseled on importance of medication, as well as interactions and proper use. Recommended continue current regimen   Patient Goals/Self-Care Activities Over the next 30 days, patient will:  take medications as prescribed  Follow Up Plan: Telephone follow up appointment with care management team member scheduled for: 1 month        Medication Assistance: None required.  Patient affirms current coverage meets needs.  Patient's preferred pharmacy is:  [id number] - [place], [place] - [address] [place] [address] Phone: [phone] Fax: [phone]  [place], [address] [place] [address]  Phone: [phone] Fax: [phone]  Uses pill box? No - leaves in original bottles, keeps AM medicine one side of counter, PM on other side. Working well for him. Pt endorses 100% compliance  Follow Up:  Patient agrees to Care Plan and Follow-up.  Plan: Telephone follow up appointment with care management team member scheduled for:  1 month  Larinda Buttery, PharmD Clinical Pharmacist [facility] [phone]

## 2021-08-10 ENCOUNTER — Encounter: Payer: Self-pay | Admitting: Sports Medicine

## 2021-08-10 NOTE — Telephone Encounter (Signed)
Please call pt to schedule appt for flu-like symptoms.  Charyl Bigger, CMA

## 2021-08-10 NOTE — Telephone Encounter (Signed)
Patient said he would try an E visit appointment on MyChart since no openings today and if he can't figure that out, he will call us back to schedule with [place] for tomorrow.

## 2021-08-11 ENCOUNTER — Ambulatory Visit (INDEPENDENT_AMBULATORY_CARE_PROVIDER_SITE_OTHER): Payer: Medicare HMO | Admitting: Pharmacist

## 2021-08-11 ENCOUNTER — Other Ambulatory Visit: Payer: Self-pay

## 2021-08-11 ENCOUNTER — Telehealth: Payer: Medicare HMO | Admitting: Family Medicine

## 2021-08-11 ENCOUNTER — Telehealth (INDEPENDENT_AMBULATORY_CARE_PROVIDER_SITE_OTHER): Payer: Medicare HMO | Admitting: Sports Medicine

## 2021-08-11 DIAGNOSIS — U071 COVID-19: Secondary | ICD-10-CM

## 2021-08-11 DIAGNOSIS — G250 Essential tremor: Secondary | ICD-10-CM

## 2021-08-11 DIAGNOSIS — E119 Type 2 diabetes mellitus without complications: Secondary | ICD-10-CM

## 2021-08-11 MED ORDER — NIRMATRELVIR/RITONAVIR (PAXLOVID)TABLET: ORAL_TABLET | ORAL | 0 refills | Status: DC

## 2021-08-11 NOTE — Patient Instructions (Signed)
Visit Information  Thank you for taking time to visit with me today. Please don't hesitate to contact me if I can be of assistance to you before our next scheduled telephone appointment.  Following are the goals we discussed today:   Patient Goals/Self-Care Activities Over the next 180 days, patient will:  take medications as prescribed  Follow Up Plan: Telephone follow up appointment with care management team member scheduled for: 6-8 months  Please call the care guide team at [phone] if you need to cancel or reschedule your appointment.    Patient verbalizes understanding of instructions provided today and agrees to view in [place].   Darius Bump

## 2021-08-11 NOTE — Progress Notes (Signed)
° °  Virtual Visit via Telephone   I connected with  James Frye  on 08/11/21 by telephone/telehealth and verified that I am speaking with the correct person using two identifiers.   I discussed the limitations, risks, security and privacy concerns of performing an evaluation and management service by telephone, including the higher likelihood of inaccurate diagnosis and treatment, and the availability of in person appointments.  We also discussed the likely need of an additional face to face encounter for complete and high quality delivery of care.  I also discussed with the patient that there may be a patient responsible charge related to this service. The patient expressed understanding and wishes to proceed.  Provider location is in medical facility. Patient location is at their home, different from provider location. People involved in care of the patient during this telehealth encounter were myself, my nurse/medical assistant, and my front office/scheduling team member.  Review of Systems: No fevers, chills, night sweats, weight loss, chest pain, or shortness of breath.   Objective Findings:    General: Speaking full sentences, no audible heavy breathing.  Sounds alert and appropriately interactive.    Independent interpretation of tests performed by another provider:   None.  Brief History, Exam, Impression, and Recommendations:    COVID-19 This is a pleasant 70 year old male, has had 1 to 2 days of viral symptoms, headache, chills, weakness. He did take a COVID test today that was positive. He is speaking full sentences on the phone, adding Paxlovid, he will get some over-the-counter TheraFlu for symptom relief. He will quarantine, return to see [place] as needed, he did have some questions about urinary symptoms, next week or the week after we should be able to further evaluate it, he likely does need a UA.   I discussed the above assessment and treatment plan with the patient.  The patient was provided an opportunity to ask questions and all were answered. The patient agreed with the plan and demonstrated an understanding of the instructions.   The patient was advised to call back or seek an in-person evaluation if the symptoms worsen or if the condition fails to improve as anticipated.   I provided 30 minutes of verbal and non-verbal time during this encounter date, time was needed to gather information, review chart, records, communicate/coordinate with staff remotely, as well as complete documentation.   ___________________________________________ [name]. Dianah Field, M.D., ABFM., CAQSM. Primary Care and Sports Medicine [facility]  Adjunct Professor of [employer]

## 2021-08-11 NOTE — Assessment & Plan Note (Signed)
This is a pleasant 70 year old male, has had 1 to 2 days of viral symptoms, headache, chills, weakness. He did take a COVID test today that was positive. He is speaking full sentences on the phone, adding Paxlovid, he will get some over-the-counter TheraFlu for symptom relief. He will quarantine, return to see [place] as needed, he did have some questions about urinary symptoms, next week or the week after we should be able to further evaluate it, he likely does need a UA.

## 2021-08-11 NOTE — Progress Notes (Signed)
Chronic Care Management Pharmacy Note  08/11/2021 Name:  James Frye MRN:  [id number] DOB:  [birth year]  Summary: addressed DM and Libre 3. Patient is enjoying Libre 3 sample, but now agrees that his sugars are in fact well controlled. CGM data as follows:  Date of Download: 08/11/21 % Time CGM is active: 93% Average Glucose: 124 mg/dL Glucose Management Indicator: 6.3%  Glucose Variability: 33.9% (goal <36%) Time in Goal:  - Time in range 70-180: 88% - Time above range: 12% - Time below range: 0% Observed patterns: overall a few elevations with meals, but nothing of great concern   Recommendations/Changes made from today's visit:  No changes, patient wishes to take a break from CGM now that he feels confident about his glucose values, which is very reasonable. He has refills for sensors at the pharmacy if he desires to use them again.  Plan: f/u with pharmacist in 6-8 months  Subjective: James Frye is an 70 y.o. year old male who is a primary patient of [name], [name], MD.  The CCM team was consulted for assistance with disease management and care coordination needs.    Engaged with patient by telephone for follow up visit in response to provider referral for pharmacy case management and/or care coordination services.   Consent to Services:  The patient was given information about Chronic Care Management services, agreed to services, and gave verbal consent prior to initiation of services.  Please see initial visit note for detailed documentation.   Patient Care Team: Silverio Decamp, MD as PCP - General (Family Medicine) Darius Bump, [facility] as Pharmacist (Pharmacist)  Recent office visits:  02/23/21 [name] NP- Patient seen for a Benign essential tremor. Patient started Buspirone HCI 5-10 MG daily. Return for mood and essential tremor follow up with Dr. Darene Lamer in 2-3 weeks.   02/18/21 [name] MD(PCP)- Patient was seen for Benign essential  tremor. Patient Primidone increased to 50 mg daily. Follow up in 4 weeks.   01/20/21 [name] MD- Patient was seen for Benign essential tremor. Patient started on Primidone 25 MD at bedtime. Follow up in 4 weeks.   12/28/20 [name] NP- Patient was seen for Rash. Labs were ordered and Taper of Prednisone 60 MG was started. Follow up if symptoms worsen.   12/03/20 [name] MD- Patient was seen for Generalized anxiety disorder. Follow up in February 2023 for annual physical.   10/22/20 [name] MD- Patient was seen for Chronic Cough. Patient preferred to discontinue Fluticasone. Referral for Behavioral Health was placed. Follow up in 6 months.   Recent consult visits:  10/28/20 [name] MD (Pulm)- Patient was seen for Chronic cough. Follow up in 6 months.   Hospital visits:  None in previous 6 months  Objective:  Lab Results  Component Value Date   CREATININE 1.36 (H) 09/24/2020   CREATININE 1.46 (H) 05/28/2020   CREATININE 1.38 (H) 09/25/2019    Lab Results  Component Value Date   HGBA1C 6.0 05/10/2021   Last diabetic Eye exam:  Lab Results  Component Value Date/Time   HMDIABEYEEXA No Retinopathy 04/14/2019 12:00 AM       Component Value Date/Time   CHOL 155 09/24/2020 0939   TRIG 56 09/24/2020 0939   HDL 91 09/24/2020 0939   CHOLHDL 1.7 09/24/2020 0939   VLDL 9 06/27/2016 0839   LDLCALC 50 09/24/2020 0939    Hepatic Function Latest Ref Rng & Units 09/24/2020 05/28/2020 09/25/2019  Total Protein 6.1 -  8.1 g/dL 6.9 7.7 7.3  Albumin 3.6 - 5.1 g/dL - - -  AST 10 - 35 U/L $Remo'12 14 11  'KeOfn$ ALT 9 - 46 U/L $Remo'14 11 11  'DUHzW$ Alk Phosphatase 40 - 115 U/L - - -  Total Bilirubin 0.2 - 1.2 mg/dL 0.5 0.7 0.5    Lab Results  Component Value Date/Time   TSH 0.87 05/28/2020 10:54 AM   TSH 1.19 01/30/2019 09:26 AM   FREET4 1.16 10/25/2012 02:31 PM    CBC Latest Ref Rng & Units 12/28/2020 09/24/2020 09/08/2020  WBC 3.8 - 10.8 Thousand/uL 4.7 10.5 5.4   Hemoglobin 13.2 - 17.1 g/dL 12.0(L) 12.1(L) 11.6(L)  Hematocrit 38.5 - 50.0 % 37.1(L) 37.0(L) 34.9(L)  Platelets 140 - 400 Thousand/uL 222 268 212.0    Social History   Tobacco Use  Smoking Status Former  Smokeless Tobacco Never   BP Readings from Last 3 Encounters:  05/10/21 119/74  04/19/21 132/88  03/22/21 111/69   Pulse Readings from Last 3 Encounters:  05/10/21 74  04/19/21 75  03/22/21 63   Wt Readings from Last 3 Encounters:  05/10/21 180 lb (81.6 kg)  04/19/21 182 lb 0.6 oz (82.6 kg)  03/22/21 179 lb (81.2 kg)    Assessment: Review of patient past medical history, allergies, medications, health status, including review of consultants reports, laboratory and other test data, was performed as part of comprehensive evaluation and provision of chronic care management services.   SDOH:  (Social Determinants of Health) assessments and interventions performed:    CCM Care Plan  Allergies  Allergen Reactions   Penicillins Rash    Medications Reviewed Today     Reviewed by Darius Bump, [facility] (Pharmacist) on 07/13/21 at 0959  Med List Status: <None>   Medication Order Taking? Sig Documenting Provider Last Dose Status Informant  busPIRone (BUSPAR) 5 MG tablet [id number] Yes TAKE 1 TO 2 TABLETS(5 TO 10 MG) BY MOUTH TWICE DAILY [name], NP Taking Active   clobetasol ointment (TEMOVATE) 0.05 % [id number] Yes Apply 1 application topically 2 (two) times daily. Silverio Decamp, MD Taking Active   Continuous Blood Gluc Receiver ([place]) DEVI [id number] No 1 each by Does not apply route daily.  Patient not taking: Reported on 07/13/2021   Silverio Decamp, MD Not Taking Active   Continuous Blood Gluc Sensor (DEXCOM G6 SENSOR) MISC [id number] No Place 1 each onto the skin daily.  Patient not taking: Reported on 06/30/2021   Silverio Decamp, MD Not Taking Active   hydrOXYzine (ATARAX/VISTARIL) 50 MG tablet [id number] Yes Take 1 tablet (50 mg  total) by mouth at bedtime. Silverio Decamp, MD Taking Active   ipratropium (ATROVENT) 0.06 % nasal spray [id number] Yes Place 2 sprays into both nostrils 3 (three) times daily. Silverio Decamp, MD Taking Active            Med Note Darius Bump   Tue Mar 29, 2021 11:30 AM) Using 2 sprays each nostril once daily  metFORMIN (GLUCOPHAGE) 1000 MG tablet [id number] Yes Take 1 tablet by mouth once daily with breakfast Silverio Decamp, MD Taking Active   primidone (MYSOLINE) 50 MG tablet [id number] Yes Take 1 tablet (50 mg total) by mouth at bedtime. Silverio Decamp, MD Taking Active   rosuvastatin (CRESTOR) 20 MG tablet [id number] Yes Take 1 tablet (20 mg total) by mouth daily. Silverio Decamp, MD Taking Active   tiotropium [facility]) 18 MCG inhalation capsule [id number] No Place 1 capsule (  18 mcg total) into inhaler and inhale daily.  Patient not taking: Reported on 06/30/2021   Silverio Decamp, MD Not Taking Active             Patient Active Problem List   Diagnosis Date Noted   Benign essential tremor 01/20/2021   Obstructive sleep apnea 10/22/2020   Generalized anxiety disorder 10/22/2020   Seborrheic keratosis 04/25/2019   Dupuytrens contracture 10/22/2018   Normocytic anemia 05/04/2018   Vertigo 05/02/2018   Dry skin 08/22/2017   Right cervical radiculopathy 06/27/2016   Ocular migraine 02/23/2016   Benign prostate hyperplasia 12/29/2015   Chronic cough 11/23/2015   CAD (coronary artery disease) 07/22/2014   Annual physical exam 11/07/2012   Insomnia 11/07/2012   Perennial allergic rhinitis 11/07/2012   H/O gastric bypass 10/09/2012   Erectile dysfunction associated with type 2 diabetes mellitus ([place]) 11/14/2011   S/P stroke due to cerebrovascular disease 11/14/2011   Diabetes mellitus type 2, controlled ([place]) 11/14/2011    Immunization History  Administered Date(s) Administered   Fluad Quad(high Dose 65+) 04/25/2019, 05/05/2020    Influenza Split 06/20/2012   Influenza, High Dose Seasonal PF 06/06/2018   Influenza,inj,Quad PF,6+ Mos 07/03/2013, 06/18/2017   PFIZER(Purple Top)SARS-COV-2 Vaccination 10/05/2019, 10/28/2019, 06/14/2020   Pneumococcal Polysaccharide-23 11/07/2012, 06/06/2018   Tdap 08/14/2010, 02/23/2014   Zoster Recombinat (Shingrix) 02/01/2021, 05/19/2021   Zoster, Live 11/07/2012    Conditions to be addressed/monitored: DMII and essential tremor  There are no care plans that you recently modified to display for this patient.       Medication Assistance: None required.  Patient affirms current coverage meets needs.  Patient's preferred pharmacy is:  [id number] - [place], [place] - [address] [place] [address] Phone: [phone] Fax: [phone]  [place], [place] [address] Phone: [phone] Fax: [phone]   Uses pill box? No - leaves in original bottles, keeps AM medicine one side of counter, PM on other side. Working well for him. Pt endorses 100% compliance  Follow Up:  Patient agrees to Care Plan and Follow-up.  Plan: Telephone follow up appointment with care management team member scheduled for:  6-8 month  Larinda Buttery, PharmD Clinical Pharmacist [facility] [phone]

## 2021-08-11 NOTE — Progress Notes (Signed)
Symptoms of headache, chills and feeling woozy started yesterday. Took COVID test this morning and it was POSITIVE.

## 2021-08-12 ENCOUNTER — Ambulatory Visit (INDEPENDENT_AMBULATORY_CARE_PROVIDER_SITE_OTHER): Payer: Medicare HMO | Admitting: Sports Medicine

## 2021-08-12 DIAGNOSIS — Z0000 Encounter for general adult medical examination without abnormal findings: Secondary | ICD-10-CM

## 2021-08-12 NOTE — Progress Notes (Signed)
MEDICARE ANNUAL WELLNESS VISIT  08/12/2021  Telephone Visit Disclaimer This Medicare AWV was conducted by telephone due to national recommendations for restrictions regarding the COVID-19 Pandemic (e.g. social distancing).  I verified, using two identifiers, that I am speaking with James Frye or their authorized healthcare agent. I discussed the limitations, risks, security, and privacy concerns of performing an evaluation and management service by telephone and the potential availability of an in-person appointment in the future. The patient expressed understanding and agreed to proceed.  Location of Patient: Home Location of Provider (nurse):  Provider home  Subjective:    James Frye is a 70 y.o. male patient of [name], [name], MD who had a Medicare Annual Wellness Visit today via telephone. James Frye is Working full time and lives with their spouse. he has 3 children. he reports that he is socially active and does interact with friends/family regularly. he is minimally physically active and enjoys working at his jobs.  Patient Care Team: Silverio Decamp, MD as PCP - General (Family Medicine) Darius Bump, [facility] as Pharmacist (Pharmacist)  Advanced Directives 08/12/2021 10/23/2018 05/08/2018 05/17/2016 12/24/2013  Does Patient Have a Medical Advance Directive? Yes No No No Patient does not have advance directive;Patient would like information  Type of Advance Directive Living will;Healthcare Power of Attorney - - - -  Does patient want to make changes to medical advance directive? No - Patient declined - - - -  Copy of [place] in Chart? No - copy requested - - - -  Would patient like information on creating a medical advance directive? - No - Patient declined No - Patient declined No - patient declined information -    Hospital Utilization Over the Past 12 Months: # of hospitalizations or ER visits: 0 # of surgeries: 0  Review of  Systems    Patient reports that his overall health is better compared to last year.  History obtained from chart review and the patient  Patient Reported Readings (BP, Pulse, CBG, Weight, etc) none  Pain Assessment Pain : 0-10 Pain Score: 2  Pain Type: Acute pain Pain Location: Head Pain Radiating Towards: Shoulder Pain Descriptors / Indicators: Headache, Aching Pain Onset: In the past 7 days Pain Frequency: Constant Pain Relieving Factors: none (tested postive for covid two days ago)  Pain Relieving Factors: none (tested postive for covid two days ago)  Current Medications & Allergies (verified) Allergies as of 08/12/2021       Reactions   Penicillins Rash        Medication List        Accurate as of August 12, 2021 10:38 AM. If you have any questions, ask your nurse or doctor.          busPIRone 5 MG tablet Commonly known as: BUSPAR TAKE 1 TO 2 TABLETS(5 TO 10 MG) BY MOUTH TWICE DAILY   clobetasol ointment 0.05 % Commonly known as: TEMOVATE Apply 1 application topically 2 (two) times daily.   FreeStyle Libre 3 Sensor Misc 1 each by Does not apply route in the morning, at noon, and at bedtime. Place 1 sensor on the skin every 14 days. Use to check glucose continuously.   hydrOXYzine 50 MG tablet Commonly known as: ATARAX Take 1 tablet (50 mg total) by mouth at bedtime.   ipratropium 0.06 % nasal spray Commonly known as: ATROVENT Place 2 sprays into both nostrils 3 (three) times daily.   metFORMIN 1000 MG tablet Commonly known  as: GLUCOPHAGE Take 1 tablet by mouth once daily with breakfast   nirmatrelvir/ritonavir EUA 20 x 150 MG & 10 x 100MG  Tabs Commonly known as: PAXLOVID 1 dose p.o. twice daily for 5 days, may use any available formulation at pharmacy with the same total dosage.   primidone 50 MG tablet Commonly known as: MYSOLINE Take 1 tablet (50 mg total) by mouth at bedtime.   rosuvastatin 20 MG tablet Commonly known as: CRESTOR Take  1 tablet (20 mg total) by mouth daily.   tiotropium 18 MCG inhalation capsule Commonly known as: SPIRIVA Place 1 capsule (18 mcg total) into inhaler and inhale daily.        History (reviewed): Past Medical History:  Diagnosis Date   CAD (coronary artery disease)    Diabetes mellitus    Hiatal hernia    Past Surgical History:  Procedure Laterality Date   APPENDECTOMY     GASTRIC BYPASS     Family History  Problem Relation Age of Onset   Cancer Mother    Stroke Father    Heart disease Father    Social History   Socioeconomic History   Marital status: Married    Spouse name: Archie Patten   Number of children: 3   Years of education: 14   Highest education level: Some college, no degree  Occupational History    Comment: works full time  Tobacco Use   Smoking status: Former   Smokeless tobacco: Never  Substance and Sexual Activity   Alcohol use: No   Drug use: No   Sexual activity: Yes  Other Topics Concern   Not on file  Social History Narrative   Lives with his wife and step-son. He works two full-time jobs. He enjoys working and doesn't have time for any hobbies.    Social Determinants of Health   Financial Resource Strain: Low Risk    Difficulty of Paying Living Expenses: Not hard at all  Food Insecurity: No Food Insecurity   Worried About Charity fundraiser in the Last Year: Never true   [place] in the Last Year: Never true  Transportation Needs: No Transportation Needs   Lack of Transportation (Medical): No   Lack of Transportation (Non-Medical): No  Physical Activity: Inactive   Days of Exercise per Week: 0 days   Minutes of Exercise per Session: 0 min  Stress: No Stress Concern Present   Feeling of Stress : Not at all  Social Connections: Moderately Integrated   Frequency of Communication with Friends and Family: Three times a week   Frequency of Social Gatherings with Friends and Family: More than three times a week   Attends Religious  Services: More than 4 times per year   Active Member of [employer] or Organizations: No   Attends Archivist Meetings: Never   Marital Status: Married    Activities of Daily Living In your present state of health, do you have any difficulty performing the following activities: 08/12/2021 08/10/2021  Hearing? N N  Vision? N N  Difficulty concentrating or making decisions? N N  Walking or climbing stairs? N N  Dressing or bathing? N N  Doing errands, shopping? N N  Preparing Food and eating ? N N  Using the Toilet? N N  In the past six months, have you accidently leaked urine? N N  Do you have problems with loss of bowel control? N N  Managing your Medications? N N  Managing your Finances? N N  Housekeeping or managing your Housekeeping? N N  Some recent data might be hidden    Patient Education/ Literacy How often do you need to have someone help you when you read instructions, pamphlets, or other written materials from your doctor or pharmacy?: 1 - Never What is the last grade level you completed in school?: Two years of college  Exercise Current Exercise Habits: The patient does not participate in regular exercise at present, Exercise limited by: None identified  Diet Patient reports consuming 1 meals a day and 1 snack(s) a day Patient reports that his primary diet is: Regular Patient reports that she does have regular access to food.   Depression Screen PHQ 2/9 Scores 08/12/2021 02/23/2021 01/20/2021 12/03/2020 09/24/2020 09/25/2019 06/18/2017  PHQ - 2 Score 0 0 0 0 0 0 0  PHQ- 9 Score - 6 3 0 - - -     Fall Risk Fall Risk  08/12/2021 08/10/2021 02/23/2021 12/28/2020 09/24/2020  Falls in the past year? 0 0 0 0 0  Number falls in past yr: 0 0 0 0 -  Injury with Fall? 0 0 0 0 -  Risk for fall due to : No Fall Risks - - - -  Follow up Education provided;Falls evaluation completed - Falls evaluation completed Falls evaluation completed -     Objective:  James Frye  seemed alert and oriented and he participated appropriately during our telephone visit.  Blood Pressure Weight BMI  BP Readings from Last 3 Encounters:  05/10/21 119/74  04/19/21 132/88  03/22/21 111/69   Wt Readings from Last 3 Encounters:  05/10/21 180 lb (81.6 kg)  04/19/21 182 lb 0.6 oz (82.6 kg)  03/22/21 179 lb (81.2 kg)   BMI Readings from Last 1 Encounters:  05/10/21 28.19 kg/m    *Unable to obtain current vital signs, weight, and BMI due to telephone visit type  Hearing/Vision  Doren Custard did not seem to have difficulty with hearing/understanding during the telephone conversation Reports that he has had a formal eye exam by an eye care professional within the past year Reports that he has not had a formal hearing evaluation within the past year *Unable to fully assess hearing and vision during telephone visit type  Cognitive Function: 6CIT Screen 08/12/2021  What Year? 0 points  What month? 0 points  What time? 0 points  Count back from 20 0 points  Months in reverse 0 points  Repeat phrase 0 points  Total Score 0   (Normal:0-7, Significant for Dysfunction: >8)  Normal Cognitive Function Screening: Yes   Immunization & Health Maintenance Record Immunization History  Administered Date(s) Administered   Fluad Quad(high Dose 65+) 04/25/2019, 05/05/2020   Influenza Split 06/20/2012   Influenza, High Dose Seasonal PF 06/06/2018   Influenza,inj,Quad PF,6+ Mos 07/03/2013, 06/18/2017   Influenza-Unspecified 07/29/2021   PFIZER(Purple Top)SARS-COV-2 Vaccination 10/05/2019, 10/28/2019, 06/14/2020   Pfizer Covid-19 Vaccine Bivalent Booster 103yrs & up 07/22/2021   Pneumococcal Polysaccharide-23 11/07/2012, 06/06/2018   Tdap 08/14/2010, 02/23/2014   Zoster Recombinat (Shingrix) 02/01/2021, 05/19/2021   Zoster, Live 11/07/2012    Health Maintenance  Topic Date Due   URINE MICROALBUMIN  10/12/2021 (Originally 09/24/2021)   Pneumonia Vaccine 37+ Years old (3 - PCV)  08/12/2022 (Originally 06/07/2019)   OPHTHALMOLOGY EXAM  09/03/2021   FOOT EXAM  09/24/2021   HEMOGLOBIN A1C  11/07/2021   TETANUS/TDAP  02/24/2024   COLONOSCOPY (Pts 45-56yrs Insurance coverage will need to be confirmed)  08/21/2028   INFLUENZA VACCINE  Completed  COVID-19 Vaccine  Completed   Hepatitis C Screening  Completed   Zoster Vaccines- Shingrix  Completed   HPV VACCINES  Aged Out       Assessment  This is a routine wellness examination for James Frye.  Health Maintenance: Due or Overdue There are no preventive care reminders to display for this patient.   James Frye does not need a referral for Community Assistance: Care Management:   no Social Work:    no Prescription Assistance:  no Nutrition/Diabetes Education:  no   Plan:  Personalized Goals  Goals Addressed               This Visit's Progress     Patient Stated (pt-stated)        08/12/2021 AWV Goal: Diabetes Management  Patient will maintain an A1C level below 6.0 Patient will not develop any diabetic foot complications Patient will not experience any hypoglycemic episodes over the next 3 months Patient will notify our office of any CBG readings outside of the provider recommended range by calling [phone] Patient will adhere to provider recommendations for diabetes management  Patient Self Management Activities take all medications as prescribed and report any negative side effects monitor and record blood sugar readings as directed adhere to a low carbohydrate diet that incorporates lean proteins, vegetables, whole grains, low glycemic fruits check feet daily noting any sores, cracks, injuries, or callous formations see PCP or podiatrist if he notices any changes in his legs, feet, or toenails Patient will visit PCP and have an A1C level checked every 3 to 6 months as directed  have a yearly eye exam to monitor for vascular changes associated with diabetes and will request that  the report be sent to his pcp.  consult with his PCP regarding any changes in his health or new or worsening symptoms        Personalized Health Maintenance & Screening Recommendations  Pneumococcal vaccine   Lung Cancer Screening Recommended: no (Low Dose CT Chest recommended if Age 15-80 years, 30 pack-year currently smoking OR have quit w/in past 15 years) Hepatitis C Screening recommended: no HIV Screening recommended: no  Advanced Directives: Written information was not prepared per patient's request.  Referrals & Orders No orders of the defined types were placed in this encounter.   Follow-up Plan Follow-up with Silverio Decamp, MD as planned Pneumonia vaccine can be done at your next in-office visit. Medicare wellness visit in one year.  Patient will access AVS on mychart.   I have personally reviewed and noted the following in the patients chart:   Medical and social history Use of alcohol, tobacco or illicit drugs  Current medications and supplements Functional ability and status Nutritional status Physical activity Advanced directives List of other physicians Hospitalizations, surgeries, and ER visits in previous 12 months Vitals Screenings to include cognitive, depression, and falls Referrals and appointments  In addition, I have reviewed and discussed with James Frye certain preventive protocols, quality metrics, and best practice recommendations. A written personalized care plan for preventive services as well as general preventive health recommendations is available and can be mailed to the patient at his request.      Tinnie Gens, RN  08/12/2021

## 2021-08-12 NOTE — Patient Instructions (Addendum)
[place] Maintenance Summary and Written Plan of Care  Mr. James Frye ,  Thank you for allowing me to perform your Medicare Annual Wellness Visit and for your ongoing commitment to your health.   Health Maintenance & Immunization History Health Maintenance  Topic Date Due   URINE MICROALBUMIN  10/12/2021 (Originally 09/24/2021)   Pneumonia Vaccine 71+ Years old (3 - PCV) 08/12/2022 (Originally 06/07/2019)   OPHTHALMOLOGY EXAM  09/03/2021   FOOT EXAM  09/24/2021   HEMOGLOBIN A1C  11/07/2021   TETANUS/TDAP  02/24/2024   COLONOSCOPY (Pts 45-59yrs Insurance coverage will need to be confirmed)  08/21/2028   INFLUENZA VACCINE  Completed   COVID-19 Vaccine  Completed   Hepatitis C Screening  Completed   Zoster Vaccines- Shingrix  Completed   HPV VACCINES  Aged Out   Immunization History  Administered Date(s) Administered   Fluad Quad(high Dose 68+) 04/25/2019, 05/05/2020   Influenza Split 06/20/2012   Influenza, High Dose Seasonal PF 68/24/2019   Influenza,inj,Quad PF,6+ Mos 07/03/2013, 06/18/2017   Influenza-Unspecified 07/29/2021   PFIZER(Purple Top)SARS-COV-2 Vaccination 10/05/2019, 10/28/2019, 06/14/2020   Pfizer Covid-19 Vaccine Bivalent Booster 47yrs & up 07/22/2021   Pneumococcal Polysaccharide-23 11/07/2012, 06/06/2018   Tdap 08/14/2010, 02/23/2014   Zoster Recombinat (Shingrix) 02/01/2021, 05/19/2021   Zoster, Live 11/07/2012    These are the patient goals that we discussed:  Goals Addressed               This Visit's Progress     Patient Stated (pt-stated)        08/12/2021 AWV Goal: Diabetes Management  Patient will maintain an A1C level below 6.0 Patient will not develop any diabetic foot complications Patient will not experience any hypoglycemic episodes over the next 3 months Patient will notify our office of any CBG readings outside of the provider recommended range by calling [phone] Patient will adhere to provider  recommendations for diabetes management  Patient Self Management Activities take all medications as prescribed and report any negative side effects monitor and record blood sugar readings as directed adhere to a low carbohydrate diet that incorporates lean proteins, vegetables, whole grains, low glycemic fruits check feet daily noting any sores, cracks, injuries, or callous formations see PCP or podiatrist if he notices any changes in his legs, feet, or toenails Patient will visit PCP and have an A1C level checked every 3 to 6 months as directed  have a yearly eye exam to monitor for vascular changes associated with diabetes and will request that the report be sent to his pcp.  consult with his PCP regarding any changes in his health or new or worsening symptoms          This is a list of Health Maintenance Items that are overdue or due now: Pneumococcal vaccine   Orders/Referrals Placed Today: No orders of the defined types were placed in this encounter.  (Contact our referral department at [phone] if you have not spoken with someone about your referral appointment within the next 5 days)    Follow-up Plan Follow-up with Silverio Decamp, MD as planned Pneumonia vaccine can be done at your next in-office visit. Medicare wellness visit in one year.  Patient will access AVS on mychart.     Health Maintenance, Male Adopting a healthy lifestyle and getting preventive care are important in promoting health and wellness. Ask your health care provider about: The right schedule for you to have regular tests and exams. Things you can do on your own  to prevent diseases and keep yourself healthy. What should I know about diet, weight, and exercise? Eat a healthy diet  Eat a diet that includes plenty of vegetables, fruits, low-fat dairy products, and lean protein. Do not eat a lot of foods that are high in solid fats, added sugars, or sodium. Maintain a healthy weight Body  mass index (BMI) is a measurement that can be used to identify possible weight problems. It estimates body fat based on height and weight. Your health care provider can help determine your BMI and help you achieve or maintain a healthy weight. Get regular exercise Get regular exercise. This is one of the most important things you can do for your health. Most adults should: Exercise for at least 150 minutes each week. The exercise should increase your heart rate and make you sweat (moderate-intensity exercise). Do strengthening exercises at least twice a week. This is in addition to the moderate-intensity exercise. Spend less time sitting. Even light physical activity can be beneficial. Watch cholesterol and blood lipids Have your blood tested for lipids and cholesterol at 68 years of age, then have this test every 5 years. You may need to have your cholesterol levels checked more often if: Your lipid or cholesterol levels are high. You are older than 68 years of age. You are at high risk for heart disease. What should I know about cancer screening? Many types of cancers can be detected early and may often be prevented. Depending on your health history and family history, you may need to have cancer screening at various ages. This may include screening for: Colorectal cancer. Prostate cancer. Skin cancer. Lung cancer. What should I know about heart disease, diabetes, and high blood pressure? Blood pressure and heart disease High blood pressure causes heart disease and increases the risk of stroke. This is more likely to develop in people who have high blood pressure readings or are overweight. Talk with your health care provider about your target blood pressure readings. Have your blood pressure checked: Every 3-5 years if you are 52-81 years of age. Every year if you are 78 years old or older. If you are between the ages of 64 and 20 and are a current or former smoker, ask your health care  provider if you should have a one-time screening for abdominal aortic aneurysm (AAA). Diabetes Have regular diabetes screenings. This checks your fasting blood sugar level. Have the screening done: Once every three years after age 42 if you are at a normal weight and have a low risk for diabetes. More often and at a younger age if you are overweight or have a high risk for diabetes. What should I know about preventing infection? Hepatitis B If you have a higher risk for hepatitis B, you should be screened for this virus. Talk with your health care provider to find out if you are at risk for hepatitis B infection. Hepatitis C Blood testing is recommended for: Everyone born from [birth year] through 1963. Anyone with known risk factors for hepatitis C. Sexually transmitted infections (STIs) You should be screened each year for STIs, including gonorrhea and chlamydia, if: You are sexually active and are younger than 68 years of age. You are older than 68 years of age and your health care provider tells you that you are at risk for this type of infection. Your sexual activity has changed since you were last screened, and you are at increased risk for chlamydia or gonorrhea. Ask your health care provider if  you are at risk. Ask your health care provider about whether you are at high risk for HIV. Your health care provider may recommend a prescription medicine to help prevent HIV infection. If you choose to take medicine to prevent HIV, you should first get tested for HIV. You should then be tested every 3 months for as long as you are taking the medicine. Follow these instructions at home: Alcohol use Do not drink alcohol if your health care provider tells you not to drink. If you drink alcohol: Limit how much you have to 0-2 drinks a day. Know how much alcohol is in your drink. In the [place] one drink equals one 12 oz bottle of beer (355 mL), one 5 oz glass of wine (148 mL), or one 1 oz glass of hard  liquor (44 mL). Lifestyle Do not use any products that contain nicotine or tobacco. These products include cigarettes, chewing tobacco, and vaping devices, such as e-cigarettes. If you need help quitting, ask your health care provider. Do not use street drugs. Do not share needles. Ask your health care provider for help if you need support or information about quitting drugs. General instructions Schedule regular health, dental, and eye exams. Stay current with your vaccines. Tell your health care provider if: You often feel depressed. You have ever been abused or do not feel safe at home. Summary Adopting a healthy lifestyle and getting preventive care are important in promoting health and wellness. Follow your health care provider's instructions about healthy diet, exercising, and getting tested or screened for diseases. Follow your health care provider's instructions on monitoring your cholesterol and blood pressure. This information is not intended to replace advice given to you by your health care provider. Make sure you discuss any questions you have with your health care provider. Document Revised: 12/20/2020 Document Reviewed: 12/20/2020 Elsevier Patient Education  Protection.

## 2021-08-13 DIAGNOSIS — E119 Type 2 diabetes mellitus without complications: Secondary | ICD-10-CM

## 2021-08-25 LAB — HM DIABETES EYE EXAM

## 2021-08-28 ENCOUNTER — Emergency Department
Admission: EM | Admit: 2021-08-28 | Discharge: 2021-08-28 | Disposition: A | Discharge status: Home / Self Care | Payer: Medicare (Managed Care) | Source: Home / Self Care

## 2021-08-28 ENCOUNTER — Other Ambulatory Visit: Payer: Self-pay

## 2021-08-28 DIAGNOSIS — R059 Cough, unspecified: Secondary | ICD-10-CM | POA: Diagnosis not present

## 2021-08-28 DIAGNOSIS — J0100 Acute maxillary sinusitis, unspecified: Secondary | ICD-10-CM

## 2021-08-28 MED ORDER — AZITHROMYCIN 250 MG PO TABS: 250.0000 mg | ORAL_TABLET | Freq: Every day | ORAL | 0 refills | Status: DC

## 2021-08-28 MED ORDER — PROMETHAZINE-DM 6.25-15 MG/5ML PO SYRP: 5.0000 mL | ORAL_SOLUTION | Freq: Two times a day (BID) | ORAL | 0 refills | Status: DC | PRN

## 2021-08-28 MED ORDER — BENZONATATE 200 MG PO CAPS: 200.0000 mg | ORAL_CAPSULE | Freq: Three times a day (TID) | ORAL | 0 refills | Status: AC | PRN

## 2021-08-28 NOTE — ED Triage Notes (Signed)
Pt presents to Urgent Care with c/o cough and nasal congestion x 2 days. Denies fever and body aches. Negative home COVID test reported.

## 2021-08-28 NOTE — Discharge Instructions (Addendum)
Advised patient to take medication as directed with food to completion.  Advised patient may take Tessalon Perles daily or as needed for cough.  Advised patient when using Promethazine DM to please use in the late afternoon, early evening or prior to sleep due to sedate of effects.  Advised patient not to use Tessalon Perles and Promethazine DM together or at the same time.  Encouraged patient to increase daily water intake while taking these medications.

## 2021-08-28 NOTE — ED Provider Notes (Signed)
[facility]    CSN: [id number] Arrival date & time: 08/28/21  1042      History   Chief Complaint Chief Complaint  Patient presents with   Cough   Nasal Congestion    HPI James Frye is a 71 y.o. male.   HPI 71 year old male presents with cough and nasal congestion for 3-4 days.  Denies fever and body aches. Reports recent negative home COVID-19 test.  PMH significant for CAD and chronic cough.  Patient reports cough has disrupted his sleep over the past 2-3 nights.  Past Medical History:  Diagnosis Date   CAD (coronary artery disease)    Diabetes mellitus    Hiatal hernia     Patient Active Problem List   Diagnosis Date Noted   COVID-19 08/11/2021   Benign essential tremor 01/20/2021   Obstructive sleep apnea 10/22/2020   Generalized anxiety disorder 10/22/2020   Seborrheic keratosis 04/25/2019   Dupuytrens contracture 10/22/2018   Normocytic anemia 05/04/2018   Vertigo 05/02/2018   Dry skin 08/22/2017   Right cervical radiculopathy 06/27/2016   Ocular migraine 02/23/2016   Benign prostate hyperplasia 12/29/2015   Chronic cough 11/23/2015   CAD (coronary artery disease) 07/22/2014   Annual physical exam 11/07/2012   Insomnia 11/07/2012   Perennial allergic rhinitis 11/07/2012   H/O gastric bypass 10/09/2012   Erectile dysfunction associated with type 2 diabetes mellitus ([place]) 11/14/2011   S/P stroke due to cerebrovascular disease 11/14/2011   Diabetes mellitus type 2, controlled ([place]) 11/14/2011    Past Surgical History:  Procedure Laterality Date   APPENDECTOMY     GASTRIC BYPASS         Home Medications    Prior to Admission medications   Medication Sig Start Date End Date Taking? Authorizing Provider  azithromycin (ZITHROMAX) 250 MG tablet Take 1 tablet (250 mg total) by mouth daily. Take first 2 tablets together, then 1 every day until finished. 08/28/21  Yes Eliezer Lofts, FNP  benzonatate (TESSALON) 200 MG capsule Take 1  capsule (200 mg total) by mouth 3 (three) times daily as needed for up to 7 days for cough. 08/28/21 09/04/21 Yes Eliezer Lofts, FNP  Homeopathic Products [facility] COLD REMEDY PO) Take by mouth.   Yes [provider]  promethazine-dextromethorphan (PROMETHAZINE-DM) 6.25-15 MG/5ML syrup Take 5 mLs by mouth 2 (two) times daily as needed for cough. 08/28/21  Yes Eliezer Lofts, FNP  busPIRone (BUSPAR) 5 MG tablet TAKE 1 TO 2 TABLETS(5 TO 10 MG) BY MOUTH TWICE DAILY 04/19/21   [name], NP  clobetasol ointment (TEMOVATE) 7.62 % Apply 1 application topically 2 (two) times daily. 04/09/20   Silverio Decamp, MD  Continuous Blood Gluc Sensor (FREESTYLE LIBRE 3 SENSOR) MISC 1 each by Does not apply route in the morning, at noon, and at bedtime. Place 1 sensor on the skin every 14 days. Use to check glucose continuously. 07/13/21   Silverio Decamp, MD  hydrOXYzine (ATARAX/VISTARIL) 50 MG tablet Take 1 tablet (50 mg total) by mouth at bedtime. 09/07/20   Silverio Decamp, MD  ipratropium (ATROVENT) 0.06 % nasal spray Place 2 sprays into both nostrils 3 (three) times daily. 09/24/20   Silverio Decamp, MD  metFORMIN (GLUCOPHAGE) 1000 MG tablet Take 1 tablet by mouth once daily with breakfast 10/19/20   Silverio Decamp, MD  nirmatrelvir/ritonavir EUA (PAXLOVID) 20 x 150 MG & 10 x 100MG  TABS 1 dose p.o. twice daily for 5 days, may use any available formulation  at pharmacy with the same total dosage. 08/11/21   Silverio Decamp, MD  primidone (MYSOLINE) 50 MG tablet Take 1 tablet (50 mg total) by mouth at bedtime. 06/22/21   Silverio Decamp, MD  rosuvastatin (CRESTOR) 20 MG tablet Take 1 tablet (20 mg total) by mouth daily. 05/28/20   Silverio Decamp, MD  tiotropium (SPIRIVA) 18 MCG inhalation capsule Place 1 capsule (18 mcg total) into inhaler and inhale daily. 09/29/20   Silverio Decamp, MD    Family History Family History  Problem Relation Age of Onset    Cancer Mother    Stroke Father    Heart disease Father     Social History Social History   Tobacco Use   Smoking status: Former   Smokeless tobacco: Never  Scientific laboratory technician Use: Never used  Substance Use Topics   Alcohol use: No   Drug use: No     Allergies   Penicillins   Review of Systems Review of Systems  HENT:  Positive for congestion.   Respiratory:  Positive for cough.   All other systems reviewed and are negative.   Physical Exam Triage Vital Signs ED Triage Vitals  Enc Vitals Group     BP 08/28/21 1100 139/85     Pulse Rate 08/28/21 1100 80     Resp 08/28/21 1100 20     Temp 08/28/21 1100 98.3 F (36.8 C)     Temp Source 08/28/21 1100 Oral     SpO2 08/28/21 1100 98 %     Weight 08/28/21 1056 178 lb (80.7 kg)     Height 08/28/21 1056 5\' 7"  (1.702 m)     Head Circumference --      Peak Flow --      Pain Score 08/28/21 1055 0     Pain Loc --      Pain Edu? --      Excl. in [place]? --    No data found.  Updated Vital Signs BP 139/85 (BP Location: Right Arm)    Pulse 80    Temp 98.3 F (36.8 C) (Oral)    Resp 20    Ht 5\' 7"  (1.702 m)    Wt 178 lb (80.7 kg)    SpO2 98%    BMI 27.88 kg/m      Physical Exam Vitals and nursing note reviewed.  Constitutional:      General: He is not in acute distress.    Appearance: Normal appearance. He is normal weight. He is not ill-appearing.  HENT:     Head: Normocephalic and atraumatic.     Right Ear: Tympanic membrane and external ear normal.     Left Ear: Tympanic membrane and external ear normal.     Ears:     Comments: Mild to moderate eustachian tube dysfunction noted bilaterally    Mouth/Throat:     Mouth: Mucous membranes are moist.     Pharynx: Oropharynx is clear.  Eyes:     Extraocular Movements: Extraocular movements intact.     Conjunctiva/sclera: Conjunctivae normal.     Pupils: Pupils are equal, round, and reactive to light.  Cardiovascular:     Rate and Rhythm: Normal rate and regular  rhythm.     Pulses: Normal pulses.     Heart sounds: Normal heart sounds.  Pulmonary:     Effort: Pulmonary effort is normal.     Breath sounds: Normal breath sounds.  Musculoskeletal:     Cervical back:  Normal range of motion.  Skin:    General: Skin is warm and dry.  Neurological:     General: No focal deficit present.     Mental Status: He is alert and oriented to person, place, and time. Mental status is at baseline.     UC Treatments / Results  Labs (all labs ordered are listed, but only abnormal results are displayed) Labs Reviewed - No data to display  EKG   Radiology No results found.  Procedures Procedures (including critical care time)  Medications Ordered in UC Medications - No data to display  Initial Impression / Assessment and Plan / UC Course  I have reviewed the triage vital signs and the nursing notes.  Pertinent labs & imaging results that were available during my care of the patient were reviewed by me and considered in my medical decision making (see chart for details).     MDM: 1.  Subacute maxillary sinusitis-Rx'd Zithromax; 2.  Cough- Rx'd Tessalon Perles and Promethazine DM. Advised patient to take medication as directed with food to completion.  Advised patient may take Tessalon Perles daily or as needed for cough.  Advised patient when using Promethazine DM to please use in the late afternoon, early evening or prior to sleep due to sedate of effects.  Advised patient not to use Tessalon Perles and Promethazine DM together or at the same time.  Encouraged patient to increase daily water intake while taking these medications.  Discharged home, hemodynamically stable. Final Clinical Impressions(s) / UC Diagnoses   Final diagnoses:  Cough, unspecified type  Subacute maxillary sinusitis     Discharge Instructions      Advised patient to take medication as directed with food to completion.  Advised patient may take Tessalon Perles daily or as  needed for cough.  Advised patient when using Promethazine DM to please use in the late afternoon, early evening or prior to sleep due to sedate of effects.  Advised patient not to use Tessalon Perles and Promethazine DM together or at the same time.  Encouraged patient to increase daily water intake while taking these medications.     ED Prescriptions     Medication Sig Dispense Auth. Provider   azithromycin (ZITHROMAX) 250 MG tablet Take 1 tablet (250 mg total) by mouth daily. Take first 2 tablets together, then 1 every day until finished. 6 tablet Eliezer Lofts, FNP   benzonatate (TESSALON) 200 MG capsule Take 1 capsule (200 mg total) by mouth 3 (three) times daily as needed for up to 7 days for cough. 40 capsule Eliezer Lofts, FNP   promethazine-dextromethorphan (PROMETHAZINE-DM) 6.25-15 MG/5ML syrup Take 5 mLs by mouth 2 (two) times daily as needed for cough. 118 mL Eliezer Lofts, FNP      PDMP not reviewed this encounter.   Eliezer Lofts, [place] 08/28/21 1145

## 2021-09-05 ENCOUNTER — Telehealth (INDEPENDENT_AMBULATORY_CARE_PROVIDER_SITE_OTHER): Payer: Medicare (Managed Care) | Admitting: Sports Medicine

## 2021-09-05 ENCOUNTER — Encounter: Payer: Self-pay | Admitting: Sports Medicine

## 2021-09-05 DIAGNOSIS — R053 Chronic cough: Secondary | ICD-10-CM

## 2021-09-05 MED ORDER — ALBUTEROL SULFATE HFA 108 (90 BASE) MCG/ACT IN AERS: 2.0000 | INHALATION_SPRAY | Freq: Four times a day (QID) | RESPIRATORY_TRACT | 11 refills | Status: DC | PRN

## 2021-09-05 MED ORDER — PREDNISONE 50 MG PO TABS
50.0000 mg | ORAL_TABLET | Freq: Every day | ORAL | 0 refills | Status: DC
Start: 2021-09-05 — End: 2022-02-09

## 2021-09-05 NOTE — Progress Notes (Signed)
° °  Virtual Visit via Telephone   I connected with  James Frye  on 09/05/21 by telephone/telehealth and verified that I am speaking with the correct person using two identifiers.   I discussed the limitations, risks, security and privacy concerns of performing an evaluation and management service by telephone, including the higher likelihood of inaccurate diagnosis and treatment, and the availability of in person appointments.  We also discussed the likely need of an additional face to face encounter for complete and high quality delivery of care.  I also discussed with the patient that there may be a patient responsible charge related to this service. The patient expressed understanding and wishes to proceed.  Provider location is in medical facility. Patient location is at their home, different from provider location. People involved in care of the patient during this telehealth encounter were myself, my nurse/medical assistant, and my front office/scheduling team member.  Review of Systems: No fevers, chills, night sweats, weight loss, chest pain, or shortness of breath.   Objective Findings:    General: Speaking full sentences, no audible heavy breathing.  Sounds alert and appropriately interactive.    Independent interpretation of tests performed by another provider:   None.  Brief History, Exam, Impression, and Recommendations:    Chronic cough James Frye returns, he is a pleasant 71 year old male with chronic cough, unclear etiology, he has seen a pulmonologist, we have tried nasal steroids, antihistamines such as azelastine, all ineffective, chlorpheniramine/dextromethorphan not sufficiently efficacious, pre and postbronchodilator spirometry was unrevealing, I tested him for tuberculosis, Goodpasture syndrome, alpha-1 antitrypsin deficiency, vasculitides, sarcoidosis, we did a high-resolution CT, all unrevealing. We had tried to get him several long-acting beta agonist and several  long-acting muscarinic antagonist, all of which were too expensive. Nasal Atrovent seems to help, unfortunately he got COVID and is having worsening cough now 3 weeks later. I did explain to him that postinfectious cough can last 4 to 6 weeks, we will do a course of steroids, albuterol, and he does understand that he may just need to shell out $74 for Advair which is the cheapest with good Rx.   I discussed the above assessment and treatment plan with the patient. The patient was provided an opportunity to ask questions and all were answered. The patient agreed with the plan and demonstrated an understanding of the instructions.   The patient was advised to call back or seek an in-person evaluation if the symptoms worsen or if the condition fails to improve as anticipated.   I provided 30 minutes of verbal and non-verbal time during this encounter date, time was needed to gather information, review chart, records, communicate/coordinate with staff remotely, as well as complete documentation.   ___________________________________________ [name]. Dianah Field, M.D., ABFM., CAQSM. Primary Care and Sports Medicine [facility]  Adjunct Professor of [employer]

## 2021-09-05 NOTE — Assessment & Plan Note (Signed)
James Frye returns, he is a pleasant 71 year old male with chronic cough, unclear etiology, he has seen a pulmonologist, we have tried nasal steroids, antihistamines such as azelastine, all ineffective, chlorpheniramine/dextromethorphan not sufficiently efficacious, pre and postbronchodilator spirometry was unrevealing, I tested him for tuberculosis, Goodpasture syndrome, alpha-1 antitrypsin deficiency, vasculitides, sarcoidosis, we did a high-resolution CT, all unrevealing. We had tried to get him several long-acting beta agonist and several long-acting muscarinic antagonist, all of which were too expensive. Nasal Atrovent seems to help, unfortunately he got COVID and is having worsening cough now 3 weeks later. I did explain to him that postinfectious cough can last 4 to 6 weeks, we will do a course of steroids, albuterol, and he does understand that he may just need to shell out $74 for Advair which is the cheapest with good Rx.

## 2021-09-16 ENCOUNTER — Other Ambulatory Visit: Payer: Self-pay

## 2021-09-16 ENCOUNTER — Encounter: Payer: Self-pay | Admitting: Sports Medicine

## 2021-09-16 ENCOUNTER — Ambulatory Visit (INDEPENDENT_AMBULATORY_CARE_PROVIDER_SITE_OTHER): Payer: Managed Care, Other (non HMO) | Admitting: Sports Medicine

## 2021-09-16 VITALS — BP 135/83 | HR 69 | Wt 188.0 lb

## 2021-09-16 DIAGNOSIS — N401 Enlarged prostate with lower urinary tract symptoms: Secondary | ICD-10-CM

## 2021-09-16 LAB — POCT URINALYSIS DIP (CLINITEK)
Bilirubin, UA: NEGATIVE
Blood, UA: NEGATIVE
Glucose, UA: NEGATIVE mg/dL
Ketones, POC UA: NEGATIVE mg/dL
Leukocytes, UA: NEGATIVE
Nitrite, UA: NEGATIVE
POC PROTEIN,UA: NEGATIVE
Spec Grav, UA: 1.025 (ref 1.010–1.025)
Urobilinogen, UA: 0.2 E.U./dL
pH, UA: 6.5 (ref 5.0–8.0)

## 2021-09-16 MED ORDER — TAMSULOSIN HCL 0.4 MG PO CAPS
0.4000 mg | ORAL_CAPSULE | Freq: Every day | ORAL | 3 refills | Status: DC
Start: 2021-09-16 — End: 2021-09-16

## 2021-09-16 MED ORDER — TAMSULOSIN HCL 0.4 MG PO CAPS: 0.4000 mg | ORAL_CAPSULE | Freq: Every day | ORAL | 3 refills | Status: DC

## 2021-09-16 MED ORDER — CIPROFLOXACIN HCL 750 MG PO TABS: 750.0000 mg | ORAL_TABLET | Freq: Two times a day (BID) | ORAL | 0 refills | Status: AC

## 2021-09-16 MED ORDER — CIPROFLOXACIN HCL 750 MG PO TABS: 750.0000 mg | ORAL_TABLET | Freq: Two times a day (BID) | ORAL | 0 refills | Status: DC

## 2021-09-16 NOTE — Progress Notes (Signed)
° ° °  Procedures performed today:    None.  Independent interpretation of notes and tests performed by another provider:   None.  Brief History, Exam, Impression, and Recommendations:    Benign prostate hyperplasia James Frye returns, he is a pleasant 71 year old male, he is experiencing episodes of urinary urgency, frequency, several episodes of nocturia, on further questioning he does have some hesitancy, dribbling, weak stream. Urinalysis was negative. Considering the acuity of this process we will add 14 days of ciprofloxacin, Flomax, return to see me in a month.  Chronic process with exacerbation and pharmacologic intervention  ___________________________________________ [name]. Dianah Field, M.D., ABFM., CAQSM. Primary Care and [place] Instructor of [place] [employer] [facility] of Medicine

## 2021-09-16 NOTE — Addendum Note (Signed)
Addended by: Gust Brooms on: 09/16/2021 02:46 PM   Modules accepted: Orders

## 2021-09-16 NOTE — Assessment & Plan Note (Signed)
Abbe [place] returns, he is a pleasant 71 year old male, he is experiencing episodes of urinary urgency, frequency, several episodes of nocturia, on further questioning he does have some hesitancy, dribbling, weak stream. Urinalysis was negative. Considering the acuity of this process we will add 14 days of ciprofloxacin, Flomax, return to see me in a month.

## 2021-09-18 LAB — URINE CULTURE
MICRO NUMBER:: 12963403
Result:: NO GROWTH
SPECIMEN QUALITY:: ADEQUATE

## 2021-09-28 ENCOUNTER — Other Ambulatory Visit: Payer: Self-pay | Admitting: Medical-Surgical

## 2021-09-29 ENCOUNTER — Other Ambulatory Visit: Payer: Self-pay

## 2021-09-29 DIAGNOSIS — F5101 Primary insomnia: Secondary | ICD-10-CM

## 2021-09-29 DIAGNOSIS — R053 Chronic cough: Secondary | ICD-10-CM

## 2021-09-29 MED ORDER — HYDROXYZINE HCL 50 MG PO TABS: 50.0000 mg | ORAL_TABLET | Freq: Every day | ORAL | 3 refills | Status: DC

## 2021-09-29 MED ORDER — IPRATROPIUM BROMIDE 0.06 % NA SOLN: 2.0000 | Freq: Three times a day (TID) | NASAL | 12 refills | Status: DC

## 2021-10-03 ENCOUNTER — Encounter: Payer: Self-pay | Admitting: Sports Medicine

## 2021-10-03 DIAGNOSIS — Z125 Encounter for screening for malignant neoplasm of prostate: Secondary | ICD-10-CM

## 2021-10-03 DIAGNOSIS — Z0000 Encounter for general adult medical examination without abnormal findings: Secondary | ICD-10-CM

## 2021-10-03 DIAGNOSIS — E119 Type 2 diabetes mellitus without complications: Secondary | ICD-10-CM

## 2021-10-03 MED ORDER — ROSUVASTATIN CALCIUM 20 MG PO TABS: 20.0000 mg | ORAL_TABLET | Freq: Every day | ORAL | 3 refills | Status: DC

## 2021-10-05 LAB — COMPLETE METABOLIC PANEL WITH GFR
AG Ratio: 1.6 (calc) (ref 1.0–2.5)
ALT: 24 U/L (ref 9–46)
AST: 21 U/L (ref 10–35)
Albumin: 4 g/dL (ref 3.6–5.1)
Alkaline phosphatase (APISO): 59 U/L (ref 35–144)
BUN/Creatinine Ratio: 16 (calc) (ref 6–22)
BUN: 29 mg/dL — ABNORMAL HIGH (ref 7–25)
CO2: 27 mmol/L (ref 20–32)
Calcium: 9.2 mg/dL (ref 8.6–10.3)
Chloride: 105 mmol/L (ref 98–110)
Creat: 1.78 mg/dL — ABNORMAL HIGH (ref 0.70–1.35)
Globulin: 2.5 g/dL (calc) (ref 1.9–3.7)
Glucose, Bld: 92 mg/dL (ref 65–99)
Potassium: 4.7 mmol/L (ref 3.5–5.3)
Sodium: 140 mmol/L (ref 135–146)
Total Bilirubin: 0.5 mg/dL (ref 0.2–1.2)
Total Protein: 6.5 g/dL (ref 6.1–8.1)
eGFR: 41 mL/min/{1.73_m2} — ABNORMAL LOW (ref >=60)

## 2021-10-05 LAB — HEMOGLOBIN A1C
Hgb A1c MFr Bld: 6.5 % of total Hgb — ABNORMAL HIGH (ref <5.7)
Mean Plasma Glucose: 140 mg/dL
eAG (mmol/L): 7.7 mmol/L

## 2021-10-05 LAB — CBC WITH DIFFERENTIAL/PLATELET
Absolute Monocytes: 459 cells/uL (ref 200–950)
Basophils Absolute: 11 cells/uL (ref 0–200)
Basophils Relative: 0.2 %
Eosinophils Absolute: 291 cells/uL (ref 15–500)
Eosinophils Relative: 5.2 %
HCT: 35.5 % — ABNORMAL LOW (ref 38.5–50.0)
Hemoglobin: 11.6 g/dL — ABNORMAL LOW (ref 13.2–17.1)
Lymphs Abs: 1070 cells/uL (ref 850–3900)
MCH: 28.8 pg (ref 27.0–33.0)
MCHC: 32.7 g/dL (ref 32.0–36.0)
MCV: 88.1 fL (ref 80.0–100.0)
MPV: 9.8 fL (ref 7.5–12.5)
Monocytes Relative: 8.2 %
Neutro Abs: 3769 cells/uL (ref 1500–7800)
Neutrophils Relative %: 67.3 %
Platelets: 237 10*3/uL (ref 140–400)
RBC: 4.03 10*6/uL — ABNORMAL LOW (ref 4.20–5.80)
RDW: 13.3 % (ref 11.0–15.0)
Total Lymphocyte: 19.1 %
WBC: 5.6 10*3/uL (ref 3.8–10.8)

## 2021-10-05 LAB — LIPID PANEL W/REFLEX DIRECT LDL
Cholesterol: 159 mg/dL (ref <200)
HDL: 94 mg/dL (ref >=40)
LDL Cholesterol (Calc): 52 mg/dL (calc)
Non-HDL Cholesterol (Calc): 65 mg/dL (calc) (ref <130)
Total CHOL/HDL Ratio: 1.7 (calc) (ref <5.0)
Triglycerides: 47 mg/dL (ref <150)

## 2021-10-05 LAB — PSA: PSA: 2.6 ng/mL (ref <=4.00)

## 2021-10-06 ENCOUNTER — Telehealth (INDEPENDENT_AMBULATORY_CARE_PROVIDER_SITE_OTHER): Payer: Managed Care, Other (non HMO) | Admitting: Sports Medicine

## 2021-10-06 DIAGNOSIS — N1832 Chronic kidney disease, stage 3b: Secondary | ICD-10-CM

## 2021-10-06 DIAGNOSIS — N183 Chronic kidney disease, stage 3 unspecified: Secondary | ICD-10-CM | POA: Insufficient documentation

## 2021-10-06 DIAGNOSIS — N2889 Other specified disorders of kidney and ureter: Secondary | ICD-10-CM | POA: Insufficient documentation

## 2021-10-06 NOTE — Assessment & Plan Note (Signed)
This is a pleasant 71 year old male, we got some routine labs, most recent renal function showed a creatinine of 1.78 with a GFR 41. This is a significant bump from prior, he is not taking the ibuprofen, he does admit he could be better with hydrating himself. We have been treating him for obstructive uropathy and BPH, Flomax did not provide much relief. Considering the bump in renal function I would like to recheck this in a month and we will also get a renal ultrasound. I do suspect this is simple medical renal disease.

## 2021-10-06 NOTE — Progress Notes (Signed)
° °  Virtual Visit via Telephone   I connected with  Barkley Bruns  on 10/06/21 by telephone/telehealth and verified that I am speaking with the correct person using two identifiers.   I discussed the limitations, risks, security and privacy concerns of performing an evaluation and management service by telephone, including the higher likelihood of inaccurate diagnosis and treatment, and the availability of in person appointments.  We also discussed the likely need of an additional face to face encounter for complete and high quality delivery of care.  I also discussed with the patient that there may be a patient responsible charge related to this service. The patient expressed understanding and wishes to proceed.  Provider location is in medical facility. Patient location is at their home, different from provider location. People involved in care of the patient during this telehealth encounter were myself, my nurse/medical assistant, and my front office/scheduling team member.  Review of Systems: No fevers, chills, night sweats, weight loss, chest pain, or shortness of breath.   Objective Findings:    General: Speaking full sentences, no audible heavy breathing.  Sounds alert and appropriately interactive.    Independent interpretation of tests performed by another provider:   None.  Brief History, Exam, Impression, and Recommendations:    Chronic renal insufficiency, stage III (moderate) ([place]) This is a pleasant 71 year old male, we got some routine labs, most recent renal function showed a creatinine of 1.78 with a GFR 41. This is a significant bump from prior, he is not taking the ibuprofen, he does admit he could be better with hydrating himself. We have been treating him for obstructive uropathy and BPH, Flomax did not provide much relief. Considering the bump in renal function I would like to recheck this in a month and we will also get a renal ultrasound. I do suspect this is  simple medical renal disease.   I discussed the above assessment and treatment plan with the patient. The patient was provided an opportunity to ask questions and all were answered. The patient agreed with the plan and demonstrated an understanding of the instructions.   The patient was advised to call back or seek an in-person evaluation if the symptoms worsen or if the condition fails to improve as anticipated.   I provided 30 minutes of verbal and non-verbal time during this encounter date, time was needed to gather information, review chart, records, communicate/coordinate with staff remotely, as well as complete documentation.   ___________________________________________ [name]. Dianah Field, M.D., ABFM., CAQSM. Primary Care and Sports Medicine [place] [facility]  Adjunct Professor of [place] [employer] [facility] of [employer]

## 2021-10-11 ENCOUNTER — Ambulatory Visit (INDEPENDENT_AMBULATORY_CARE_PROVIDER_SITE_OTHER): Payer: Managed Care, Other (non HMO)

## 2021-10-11 ENCOUNTER — Other Ambulatory Visit: Payer: Self-pay

## 2021-10-11 DIAGNOSIS — N1832 Chronic kidney disease, stage 3b: Secondary | ICD-10-CM

## 2021-10-17 ENCOUNTER — Other Ambulatory Visit: Payer: Self-pay

## 2021-10-17 ENCOUNTER — Ambulatory Visit (INDEPENDENT_AMBULATORY_CARE_PROVIDER_SITE_OTHER): Payer: Managed Care, Other (non HMO) | Admitting: Sports Medicine

## 2021-10-17 DIAGNOSIS — N401 Enlarged prostate with lower urinary tract symptoms: Secondary | ICD-10-CM

## 2021-10-17 MED ORDER — TAMSULOSIN HCL 0.4 MG PO CAPS: 0.4000 mg | ORAL_CAPSULE | Freq: Two times a day (BID) | ORAL | 3 refills | Status: DC

## 2021-10-17 NOTE — Progress Notes (Signed)
? ? ?  Procedures performed today:   ? ?None. ? ?Independent interpretation of notes and tests performed by another provider:  ? ?None. ? ?Brief History, Exam, Impression, and Recommendations:   ? ?Benign prostate hyperplasia ?Gaylan returns, he is a pleasant 71 year old male, he had multiple episodes of urinary urgency, frequency, and episodes of nocturia. ?He also had hesitancy, dribbling and a weak stream. ?Urinalysis was negative, he developed this in a relatively acute chronicity, so we suspected some prostatitis, I treated him with 14 days of ciprofloxacin and added Flomax once a day. ?In addition we noted a bump in his renal function. ?He really did not notice much improvement in symptomatology, I obtained a renal ultrasound, it showed findings suspicious of medical renal disease and a very large nodular prostate. ?We will increase Flomax to twice daily, and I would like a second opinion from urology. ?We will also recheck his renal function when he sees me again in a month. ? ?Chronic process not at goal with pharmacologic intervention ? ?___________________________________________ ?[name]. Dianah Field, M.D., ABFM., CAQSM. ?Primary Care and Sports Medicine ?[place] ? ?Adjunct Instructor of Family Medicine  ?[employer] ?

## 2021-10-17 NOTE — Assessment & Plan Note (Addendum)
Amber returns, he is a pleasant 71 year old male, he had multiple episodes of urinary urgency, frequency, and episodes of nocturia. ?He also had hesitancy, dribbling and a weak stream. ?Urinalysis was negative, he developed this in a relatively acute chronicity, so we suspected some prostatitis, I treated him with 14 days of ciprofloxacin and added Flomax once a day. ?In addition we noted a bump in his renal function. ?He really did not notice much improvement in symptomatology, I obtained a renal ultrasound, it showed findings suspicious of medical renal disease and a very large nodular prostate. ?We will increase Flomax to twice daily, and I would like a second opinion from urology. ?We will also recheck his renal function when he sees me again in a month. ?

## 2021-11-01 ENCOUNTER — Other Ambulatory Visit: Payer: Self-pay

## 2021-11-01 MED ORDER — METFORMIN HCL 1000 MG PO TABS: ORAL_TABLET | ORAL | 3 refills | Status: DC

## 2021-11-08 ENCOUNTER — Other Ambulatory Visit: Payer: Self-pay

## 2021-11-08 MED ORDER — BUSPIRONE HCL 5 MG PO TABS: ORAL_TABLET | ORAL | 0 refills | Status: DC

## 2021-11-21 ENCOUNTER — Ambulatory Visit (INDEPENDENT_AMBULATORY_CARE_PROVIDER_SITE_OTHER): Payer: Managed Care, Other (non HMO) | Admitting: Sports Medicine

## 2021-11-21 DIAGNOSIS — N1832 Chronic kidney disease, stage 3b: Secondary | ICD-10-CM | POA: Diagnosis not present

## 2021-11-21 DIAGNOSIS — N2889 Other specified disorders of kidney and ureter: Secondary | ICD-10-CM

## 2021-11-21 DIAGNOSIS — N401 Enlarged prostate with lower urinary tract symptoms: Secondary | ICD-10-CM

## 2021-11-21 NOTE — Assessment & Plan Note (Signed)
James Frye returns, he is a pleasant 70 year old male, historically multiple episodes of urinary urgency, frequency, nocturia, hesitancy, dribbling, weak stream, urinalysis was negative, due to the acute onset of this we suspected prostatitis and treat him with 14 days of ciprofloxacin, Flomax once daily. ?At the last visit he had not noted much improvement so we bumped up his Flomax to twice a day, we also noted a worsening of renal function. ?Today he tells me his symptoms have improved considerably though he did have an episode of orthostasis, we will leave his Flomax at once daily rather than continue it twice daily. ?He also has not yet been contacted by the urologist. ?

## 2021-11-21 NOTE — Assessment & Plan Note (Addendum)
James Frye did have a renal function done recently with a worsening creatinine up to 1.78 with a GFR of 41, we have been treating him for obstructive uropathy and BPH, rechecking renal function today. ? ?Update: Renal function has worsened from the last check as well, I am going to get him in with nephrology, he really does need to make the appointment with urology as well, blood sugar has increased significantly, please add a hemoglobin A1c to the blood already in the lab. ?

## 2021-11-21 NOTE — Progress Notes (Addendum)
? ? ?  Procedures performed today:   ? ?None. ? ?Independent interpretation of notes and tests performed by another provider:  ? ?None. ? ?Brief History, Exam, Impression, and Recommendations:   ? ?Benign prostate hyperplasia ?James Frye returns, he is a pleasant 71 year old male, historically multiple episodes of urinary urgency, frequency, nocturia, hesitancy, dribbling, weak stream, urinalysis was negative, due to the acute onset of this we suspected prostatitis and treat him with 14 days of ciprofloxacin, Flomax once daily. ?At the last visit he had not noted much improvement so we bumped up his Flomax to twice a day, we also noted a worsening of renal function. ?Today he tells me his symptoms have improved considerably though he did have an episode of orthostasis, we will leave his Flomax at once daily rather than continue it twice daily. ?He also has not yet been contacted by the urologist. ? ?Chronic renal insufficiency, stage III (moderate) (HCC) ?James Frye did have a renal function done recently with a worsening creatinine up to 1.78 with a GFR of 41, we have been treating him for obstructive uropathy and BPH, rechecking renal function today. ? ? ?Update: Renal function has worsened from the last check as well, I am going to get him in with nephrology, he really does need to make the appointment with urology as well, blood sugar has increased significantly, please add a hemoglobin A1c to the blood already in the lab. ? ? ? ?___________________________________________ ?[name]. Dianah Field, M.D., ABFM., CAQSM. ?Primary Care and Sports Medicine ?[place] ? ?Adjunct Instructor of Family Medicine  ?[employer] ?

## 2021-11-22 LAB — RENAL FUNCTION PANEL
Albumin: 4.2 g/dL (ref 3.6–5.1)
BUN/Creatinine Ratio: 15 (calc) (ref 6–22)
BUN: 27 mg/dL — ABNORMAL HIGH (ref 7–25)
CO2: 26 mmol/L (ref 20–32)
Calcium: 9.2 mg/dL (ref 8.6–10.3)
Chloride: 103 mmol/L (ref 98–110)
Creat: 1.84 mg/dL — ABNORMAL HIGH (ref 0.70–1.35)
Glucose, Bld: 226 mg/dL — ABNORMAL HIGH (ref 65–99)
Phosphorus: 4.4 mg/dL — ABNORMAL HIGH (ref 2.1–4.3)
Potassium: 4.8 mmol/L (ref 3.5–5.3)
Sodium: 137 mmol/L (ref 135–146)

## 2021-11-22 LAB — HEMOGLOBIN A1C

## 2021-11-22 NOTE — Addendum Note (Signed)
Addended by: Silverio Decamp on: 11/22/2021 09:25 AM ? ? Modules accepted: Orders ? ?

## 2021-12-09 DIAGNOSIS — R350 Frequency of micturition: Secondary | ICD-10-CM | POA: Diagnosis not present

## 2021-12-09 DIAGNOSIS — R3915 Urgency of urination: Secondary | ICD-10-CM | POA: Diagnosis not present

## 2021-12-09 DIAGNOSIS — N401 Enlarged prostate with lower urinary tract symptoms: Secondary | ICD-10-CM | POA: Diagnosis not present

## 2021-12-09 DIAGNOSIS — R3912 Poor urinary stream: Secondary | ICD-10-CM | POA: Diagnosis not present

## 2021-12-19 ENCOUNTER — Other Ambulatory Visit: Payer: Self-pay

## 2021-12-19 DIAGNOSIS — R053 Chronic cough: Secondary | ICD-10-CM

## 2021-12-19 DIAGNOSIS — E119 Type 2 diabetes mellitus without complications: Secondary | ICD-10-CM

## 2021-12-19 DIAGNOSIS — G250 Essential tremor: Secondary | ICD-10-CM

## 2021-12-19 DIAGNOSIS — F5101 Primary insomnia: Secondary | ICD-10-CM

## 2021-12-19 DIAGNOSIS — N401 Enlarged prostate with lower urinary tract symptoms: Secondary | ICD-10-CM

## 2021-12-19 MED ORDER — IPRATROPIUM BROMIDE 0.06 % NA SOLN: 2.0000 | Freq: Three times a day (TID) | NASAL | 1 refills | Status: DC

## 2021-12-19 MED ORDER — METFORMIN HCL 1000 MG PO TABS: ORAL_TABLET | ORAL | 1 refills | Status: DC

## 2021-12-19 MED ORDER — PRIMIDONE 50 MG PO TABS: 50.0000 mg | ORAL_TABLET | Freq: Every day | ORAL | 1 refills | Status: DC

## 2021-12-19 MED ORDER — ROSUVASTATIN CALCIUM 20 MG PO TABS: 20.0000 mg | ORAL_TABLET | Freq: Every day | ORAL | 1 refills | Status: DC

## 2021-12-19 MED ORDER — BUSPIRONE HCL 5 MG PO TABS
ORAL_TABLET | ORAL | 0 refills | Status: DC
Start: 2021-12-19 — End: 2022-04-06

## 2021-12-19 MED ORDER — TAMSULOSIN HCL 0.4 MG PO CAPS: 0.4000 mg | ORAL_CAPSULE | Freq: Every day | ORAL | 1 refills | Status: DC

## 2021-12-19 MED ORDER — HYDROXYZINE HCL 50 MG PO TABS: 50.0000 mg | ORAL_TABLET | Freq: Every day | ORAL | 1 refills | Status: DC

## 2022-02-09 ENCOUNTER — Ambulatory Visit: Payer: Medicare HMO | Admitting: Pharmacist

## 2022-02-09 NOTE — Progress Notes (Signed)
Chronic Care Management Pharmacy Note  02/09/2022 Name:  JULIE PAOLINI MRN:  [id number] DOB:  22-May-1951  Summary: addressed DM, tremor. Patient did have glucose elevations, and is awaiting repeat a1c at upcoming PCP visit.  Recommendations/Changes made from today's visit:  No changes, will follow along for supporting patient if changes are necessary after repeat a1c. Otherwise stable.  Plan: f/u with pharmacist in 3 months  Subjective: KALVEN GANIM is an 71 y.o. year old male who is a primary patient of [name], [name], MD.  The CCM team was consulted for assistance with disease management and care coordination needs.    Engaged with patient by telephone for follow up visit in response to provider referral for pharmacy case management and/or care coordination services.   Consent to Services:  The patient was given information about Chronic Care Management services, agreed to services, and gave verbal consent prior to initiation of services.  Please see initial visit note for detailed documentation.   Patient Care Team: Silverio Decamp, MD as PCP - General (Family Medicine) Darius Bump, [facility] as Pharmacist (Pharmacist)  Recent office visits:  02/23/21 [name] NP- Patient seen for a Benign essential tremor. Patient started Buspirone HCI 5-10 MG daily. Return for mood and essential tremor follow up with Dr. Darene Lamer in 2-3 weeks.   02/18/21 [name] MD(PCP)- Patient was seen for Benign essential tremor. Patient Primidone increased to 50 mg daily. Follow up in 4 weeks.   01/20/21 [name] MD- Patient was seen for Benign essential tremor. Patient started on Primidone 25 MD at bedtime. Follow up in 4 weeks.   12/28/20 [name] NP- Patient was seen for Rash. Labs were ordered and Taper of Prednisone 60 MG was started. Follow up if symptoms worsen.   12/03/20 [name] MD- Patient was seen for Generalized anxiety disorder. Follow up in February  2023 for annual physical.   10/22/20 [name] MD- Patient was seen for Chronic Cough. Patient preferred to discontinue Fluticasone. Referral for Behavioral Health was placed. Follow up in 6 months.   Recent consult visits:  10/28/20 [name] MD (Pulm)- Patient was seen for Chronic cough. Follow up in 6 months.   Hospital visits:  None in previous 6 months  Objective:  Lab Results  Component Value Date   CREATININE 1.84 (H) 11/21/2021   CREATININE 1.78 (H) 10/04/2021   CREATININE 1.36 (H) 09/24/2020    Lab Results  Component Value Date   HGBA1C CANCELED 11/21/2021   Last diabetic Eye exam:  Lab Results  Component Value Date/Time   HMDIABEYEEXA No Retinopathy 08/25/2021 12:00 AM       Component Value Date/Time   CHOL 159 10/04/2021 0000   TRIG 47 10/04/2021 0000   HDL 94 10/04/2021 0000   CHOLHDL 1.7 10/04/2021 0000   VLDL 9 06/27/2016 0839   LDLCALC 52 10/04/2021 0000       Latest Ref Rng & Units 10/04/2021   12:00 AM 09/24/2020    9:39 AM 05/28/2020   10:54 AM  Hepatic Function  Total Protein 6.1 - 8.1 g/dL 6.5  6.9  7.7   AST 10 - 35 U/L '21  12  14   '$ ALT 9 - 46 U/L '24  14  11   '$ Total Bilirubin 0.2 - 1.2 mg/dL 0.5  0.5  0.7     Lab Results  Component Value Date/Time   TSH 0.87 05/28/2020 10:54 AM   TSH 1.19 01/30/2019 09:26 AM   FREET4  1.16 10/25/2012 02:31 PM       Latest Ref Rng & Units 10/04/2021   12:00 AM 12/28/2020   12:00 AM 09/24/2020    9:39 AM  CBC  WBC 3.8 - 10.8 Thousand/uL 5.6  4.7  10.5   Hemoglobin 13.2 - 17.1 g/dL 11.6  12.0  12.1   Hematocrit 38.5 - 50.0 % 35.5  37.1  37.0   Platelets 140 - 400 Thousand/uL 237  222  268     Social History   Tobacco Use  Smoking Status Former  Smokeless Tobacco Never   BP Readings from Last 3 Encounters:  11/21/21 135/83  10/17/21 132/85  09/16/21 135/83   Pulse Readings from Last 3 Encounters:  11/21/21 77  10/17/21 75  09/16/21 69   Wt Readings from Last 3 Encounters:   11/21/21 191 lb 1.9 oz (86.7 kg)  10/17/21 188 lb (85.3 kg)  09/16/21 188 lb 0.6 oz (85.3 kg)    Assessment: Review of patient past medical history, allergies, medications, health status, including review of consultants reports, laboratory and other test data, was performed as part of comprehensive evaluation and provision of chronic care management services.   SDOH:  (Social Determinants of Health) assessments and interventions performed:    CCM Care Plan  Allergies  Allergen Reactions   Penicillins Rash    Medications Reviewed Today     Reviewed by Darius Bump, [facility] (Pharmacist) on 02/09/22 at 1029  Med List Status: <None>   Medication Order Taking? Sig Documenting Provider Last Dose Status Informant  busPIRone (BUSPAR) 5 MG tablet [id number] Yes TAKE 1 TO 2 TABLETS(5 TO 10 MG) BY MOUTH TWICE DAILY Silverio Decamp, MD Taking Active   Continuous Blood Gluc Sensor (FREESTYLE LIBRE 3 SENSOR) [place] [id number]  1 each by Does not apply route in the morning, at noon, and at bedtime. Place 1 sensor on the skin every 14 days. Use to check glucose continuously. Silverio Decamp, MD  Active   hydrOXYzine (ATARAX) 50 MG tablet [id number] Yes Take 1 tablet (50 mg total) by mouth at bedtime. Silverio Decamp, MD Taking Active   ipratropium (ATROVENT) 0.06 % nasal spray [id number] Yes Place 2 sprays into both nostrils 3 (three) times daily. Silverio Decamp, MD Taking Active   metFORMIN (GLUCOPHAGE) 1000 MG tablet [id number] Yes Take 1 tablet by mouth once daily with breakfast Silverio Decamp, MD Taking Active   primidone (MYSOLINE) 50 MG tablet [id number] Yes Take 1 tablet (50 mg total) by mouth at bedtime. Silverio Decamp, MD Taking Active   rosuvastatin (CRESTOR) 20 MG tablet [id number] Yes Take 1 tablet (20 mg total) by mouth daily. Silverio Decamp, MD Taking Active   tamsulosin [facility]) 0.4 MG CAPS capsule [id number] Yes Take 1 capsule (0.4 mg total) by  mouth daily after breakfast. Silverio Decamp, MD Taking Active             Patient Active Problem List   Diagnosis Date Noted   Chronic renal insufficiency, stage III (moderate) (HCC) 10/06/2021   Benign essential tremor 01/20/2021   Obstructive sleep apnea 10/22/2020   Generalized anxiety disorder 10/22/2020   Seborrheic keratosis 04/25/2019   Dupuytrens contracture 10/22/2018   Normocytic anemia 05/04/2018   Vertigo 05/02/2018   Right cervical radiculopathy 06/27/2016   Benign prostate hyperplasia 12/29/2015   CAD (coronary artery disease) 07/22/2014   Annual physical exam 11/07/2012   Insomnia 11/07/2012   Perennial allergic rhinitis 11/07/2012   H/O gastric bypass  10/09/2012   Erectile dysfunction associated with type 2 diabetes mellitus ([place]) 11/14/2011   S/P stroke due to cerebrovascular disease 11/14/2011   Diabetes mellitus type 2, controlled ([place]) 11/14/2011    Immunization History  Administered Date(s) Administered   Fluad Quad(high Dose 65+) 04/25/2019, 05/05/2020   Influenza Split 06/20/2012   Influenza, High Dose Seasonal PF 06/06/2018   Influenza,inj,Quad PF,6+ Mos 07/03/2013, 06/18/2017   Influenza-Unspecified 07/29/2021   PFIZER(Purple Top)SARS-COV-2 Vaccination 10/05/2019, 10/28/2019, 06/14/2020   Pfizer Covid-19 Vaccine Bivalent Booster 88yr & up 07/22/2021   Pneumococcal Polysaccharide-23 11/07/2012, 06/06/2018   Tdap 08/14/2010, 02/23/2014   Zoster Recombinat (Shingrix) 02/01/2021, 05/19/2021   Zoster, Live 11/07/2012    Conditions to be addressed/monitored: DMII and essential tremor  Care Plan : Medication Management  Updates made by KDarius Bump RS[place]ac[place] 02/09/2022 12:00 AM     Problem: DM, essential tremor      Long-Range Goal: Disease Progression Prevention   Start Date: 03/29/2021  Recent Progress: On track  Priority: High  Note:   Current Barriers:  None at present   Pharmacist Clinical Goal(s):  Over the next 90  days, patient will maintain control of chronic conditions as evidenced by medication fill history, lab values, and vital signs  through collaboration with PharmD and provider.   Interventions: 1:1 collaboration with TSilverio Decamp MD regarding development and update of comprehensive plan of care as evidenced by provider attestation and co-signature Inter-disciplinary care team collaboration (see longitudinal plan of care) Comprehensive medication review performed; medication list updated in electronic medical record  Diabetes:  Controlled; current treatment: metformin 1g daily; a1c 6.3  Current glucose readings: per CGM as below:  Date of Download: 08/11/21 % Time CGM is active: 93% Average Glucose: 124 mg/dL Glucose Management Indicator: 6.3%  Glucose Variability: 33.9% (goal <36%) Time in Goal:  - Time in range 70-180: 88% - Time above range: 12% - Time below range: 0% Observed patterns: overall a few elevations with meals, but nothing of great concern   Denies hypoglycemic/hyperglycemic symptoms  Current meal patterns: breakfast: coffee, doesn't eat; lunch: 1/2 cheeseburger; dinner: meat +veggies from restaurant and takes back to work (2nd shift); snacks: banana, grapes; drinks: water, sweet tea (small portion, occasional soda (2 per week)  Current exercise: 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 Recommended continue current regimen   Patient Goals/Self-Care Activities Over the next 90 days, patient will:  take medications as prescribed  Follow Up Plan: Telephone follow up appointment with care management team member scheduled for:3 months         Medication Assistance: None  required.  Patient affirms current coverage meets needs.  [id number] [place] [place]Phone: [phone]: [phone] [address] [place] [address] [id number]: [phone]: [address]Phone: [phone]: [phone]  Uses pill box? No - leaves in original bottles, keeps AM medicine one side of counter, PM on other side. Working  well for him. Pt endorses 100% compliance  Follow Up:  Patient agrees to Care Plan and Follow-up.  Plan: Telephone follow up appointment with care management team member scheduled for:  6-8 month  Larinda Buttery, PharmD Clinical Pharmacist [facility] [phone]

## 2022-02-15 ENCOUNTER — Ambulatory Visit (INDEPENDENT_AMBULATORY_CARE_PROVIDER_SITE_OTHER): Payer: Medicare HMO | Admitting: Sports Medicine

## 2022-02-15 ENCOUNTER — Encounter: Payer: Self-pay | Admitting: Sports Medicine

## 2022-02-15 VITALS — BP 144/88 | HR 60 | Ht 67.0 in | Wt 189.0 lb

## 2022-02-15 DIAGNOSIS — E119 Type 2 diabetes mellitus without complications: Secondary | ICD-10-CM

## 2022-02-15 DIAGNOSIS — M7541 Impingement syndrome of right shoulder: Secondary | ICD-10-CM | POA: Diagnosis not present

## 2022-02-15 DIAGNOSIS — N2889 Other specified disorders of kidney and ureter: Secondary | ICD-10-CM

## 2022-02-15 DIAGNOSIS — N1832 Chronic kidney disease, stage 3b: Secondary | ICD-10-CM

## 2022-02-15 LAB — POCT GLYCOSYLATED HEMOGLOBIN (HGB A1C): HbA1c, POC (controlled diabetic range): 6.2 % (ref 0.0–7.0)

## 2022-02-15 LAB — POCT UA - MICROALBUMIN
Creatinine, POC: 200 mg/dL
Microalbumin Ur, POC: 80 mg/L

## 2022-02-15 MED ORDER — LISINOPRIL 2.5 MG PO TABS: 2.5000 mg | ORAL_TABLET | Freq: Every day | ORAL | 3 refills | Status: DC

## 2022-02-15 NOTE — Progress Notes (Signed)
    Procedures performed today:    None.  Independent interpretation of notes and tests performed by another provider:   None.  Brief History, Exam, Impression, and Recommendations:    Impingement syndrome, shoulder, right Pleasant 71 year old male, increasing shoulder pain, localized over the deltoid worse with abduction. Positive impingement signs on exam. We will do cuff conditioning for 6 weeks before considering imaging or injections.  Chronic renal insufficiency, stage III (moderate) (HCC) Has not yet seen the nephrologist. We printed out the contact information.  Diabetes mellitus type 2, controlled Under adequate control but he does have some abnormal microalbumin in the urine. Adding low-dose lisinopril. He is going to get a consultation with nephrology.  Chronic process with exacerbation and pharmacologic intervention  ____________________________________________ [name]. Dianah Field, M.D., ABFM., CAQSM., AME. Primary Care and Sports Medicine [facility]  Adjunct Professor of [place] [employer] [facility] Medicine  Risk manager

## 2022-02-15 NOTE — Assessment & Plan Note (Signed)
Pleasant 71 year old male, increasing shoulder pain, localized over the deltoid worse with abduction. Positive impingement signs on exam. We will do cuff conditioning for 6 weeks before considering imaging or injections.

## 2022-02-15 NOTE — Assessment & Plan Note (Signed)
Has not yet seen the nephrologist. We printed out the contact information.

## 2022-02-15 NOTE — Assessment & Plan Note (Signed)
Under adequate control but he does have some abnormal microalbumin in the urine. Adding low-dose lisinopril. He is going to get a consultation with nephrology.

## 2022-03-07 ENCOUNTER — Encounter: Payer: Self-pay | Admitting: Sports Medicine

## 2022-03-07 DIAGNOSIS — N179 Acute kidney failure, unspecified: Secondary | ICD-10-CM | POA: Diagnosis not present

## 2022-03-07 DIAGNOSIS — N401 Enlarged prostate with lower urinary tract symptoms: Secondary | ICD-10-CM | POA: Diagnosis not present

## 2022-03-07 DIAGNOSIS — R350 Frequency of micturition: Secondary | ICD-10-CM | POA: Diagnosis not present

## 2022-03-07 NOTE — Telephone Encounter (Signed)
Patient is scheduled   

## 2022-03-09 DIAGNOSIS — N179 Acute kidney failure, unspecified: Secondary | ICD-10-CM | POA: Diagnosis not present

## 2022-03-10 ENCOUNTER — Encounter: Payer: Self-pay | Admitting: Sports Medicine

## 2022-03-10 ENCOUNTER — Ambulatory Visit (INDEPENDENT_AMBULATORY_CARE_PROVIDER_SITE_OTHER): Payer: Managed Care, Other (non HMO) | Admitting: Sports Medicine

## 2022-03-10 DIAGNOSIS — R053 Chronic cough: Secondary | ICD-10-CM | POA: Diagnosis not present

## 2022-03-10 DIAGNOSIS — N2889 Other specified disorders of kidney and ureter: Secondary | ICD-10-CM

## 2022-03-10 DIAGNOSIS — N1832 Chronic kidney disease, stage 3b: Secondary | ICD-10-CM

## 2022-03-10 DIAGNOSIS — E119 Type 2 diabetes mellitus without complications: Secondary | ICD-10-CM | POA: Diagnosis not present

## 2022-03-10 DIAGNOSIS — E1122 Type 2 diabetes mellitus with diabetic chronic kidney disease: Secondary | ICD-10-CM | POA: Diagnosis not present

## 2022-03-10 NOTE — Assessment & Plan Note (Signed)
James Frye returns, he is a pleasant 71 year old male, chronic cough, historically unclear etiology, he has seen a pulmonologist, will try nasal steroids, nasal antihistamines such as azelastine, everything was effective, chlorpheniramine/dextromethorphan was not efficacious, pre and postbronchodilator spirometry was unrevealing, I tested him for tuberculosis, Goodpasture syndrome, alpha-1 antitrypsin deficiency, vasculitides, sarcoidosis, we did a high-resolution CT, all unrevealing. We have tried several long-acting beta agonist and several long-acting muscarinic antagonist inhalers, all of which were too expensive, nasal Atrovent did seem to help a little bit, he did get COVID along the way and was having the worsening cough. Still has some dysosmia and dysgeusia. I would like him to touch base with ENT, he does continue to complain of postnasal drip through the night

## 2022-03-10 NOTE — Assessment & Plan Note (Signed)
Metformin cut in half by nephrology, he can restart his rosuvastatin.

## 2022-03-10 NOTE — Progress Notes (Signed)
g   Procedures performed today:    None.  Independent interpretation of notes and tests performed by another provider:   None.  Brief History, Exam, Impression, and Recommendations:    Chronic renal insufficiency, stage III (moderate) (HCC) CKD 3, did see nephrology, she is doing some additional testing. Sounds like James Frye was taken off of his lisinopril due to hypotension, likely will be restarted later. Continue to hydrate aggressively.  Diabetes mellitus type 2, controlled Metformin cut in half by nephrology, James Frye can restart his rosuvastatin.  Chronic cough James Frye returns, James Frye is a pleasant 71 year old male, chronic cough, historically unclear etiology, James Frye has seen a pulmonologist, will try nasal steroids, nasal antihistamines such as azelastine, everything was effective, chlorpheniramine/dextromethorphan was not efficacious, pre and postbronchodilator spirometry was unrevealing, I tested him for tuberculosis, Goodpasture syndrome, alpha-1 antitrypsin deficiency, vasculitides, sarcoidosis, we did a high-resolution CT, all unrevealing. We have tried several long-acting beta agonist and several long-acting muscarinic antagonist inhalers, all of which were too expensive, nasal Atrovent did seem to help a little bit, James Frye did get COVID along the way and was having the worsening cough. Still has some dysosmia and dysgeusia. I would like him to touch base with ENT, James Frye does continue to complain of postnasal drip through the night    ____________________________________________ James Frye. James Frye, M.D., ABFM., CAQSM., AME. Primary Care and Sports Medicine [place] [facility]  Adjunct Professor of [place] [employer] [facility] of Medicine  Risk manager

## 2022-03-10 NOTE — Assessment & Plan Note (Signed)
CKD 3, did see nephrology, she is doing some additional testing. Sounds like he was taken off of his lisinopril due to hypotension, likely will be restarted later. Continue to hydrate aggressively.

## 2022-03-15 ENCOUNTER — Encounter: Payer: Self-pay | Admitting: Sports Medicine

## 2022-03-29 ENCOUNTER — Encounter: Payer: Self-pay | Admitting: Sports Medicine

## 2022-03-29 ENCOUNTER — Ambulatory Visit (INDEPENDENT_AMBULATORY_CARE_PROVIDER_SITE_OTHER): Payer: Managed Care, Other (non HMO) | Admitting: Sports Medicine

## 2022-03-29 DIAGNOSIS — M7541 Impingement syndrome of right shoulder: Secondary | ICD-10-CM | POA: Diagnosis not present

## 2022-03-29 DIAGNOSIS — N2889 Other specified disorders of kidney and ureter: Secondary | ICD-10-CM

## 2022-03-29 DIAGNOSIS — N1832 Chronic kidney disease, stage 3b: Secondary | ICD-10-CM | POA: Diagnosis not present

## 2022-03-29 DIAGNOSIS — H43393 Other vitreous opacities, bilateral: Secondary | ICD-10-CM | POA: Diagnosis not present

## 2022-03-29 NOTE — Assessment & Plan Note (Signed)
Complaining of spots that move around when he looks around, sounds like vitreous floaters, he is a diabetic so I would like optometry to weigh in downstairs as he is going to be due for diabetic eye exam anyway.

## 2022-03-29 NOTE — Assessment & Plan Note (Signed)
Resolved with conditioning exercises.

## 2022-03-29 NOTE — Progress Notes (Signed)
    Procedures performed today:    None.  Independent interpretation of notes and tests performed by another provider:   None.  Brief History, Exam, Impression, and Recommendations:    Chronic renal insufficiency, stage III (moderate) (HCC) Phil does have CKD 3, she has worked with nephrology, taken off lisinopril due to hypotension, blood pressure getting borderline today. He will monitor and if it goes above 140/90 we will restart low-dose lisinopril.  Impingement syndrome, shoulder, right Resolved with conditioning exercises.  Vitreous floater, bilateral Complaining of spots that move around when he looks around, sounds like vitreous floaters, he is a diabetic so I would like optometry to weigh in downstairs as he is going to be due for diabetic eye exam anyway.    ____________________________________________ [name]. Dianah Field, M.D., ABFM., CAQSM., AME. Primary Care and Sports Medicine [facility]  Adjunct Professor of [place] [employer] [facility] [employer]  Risk manager

## 2022-03-29 NOTE — Assessment & Plan Note (Signed)
James Frye does have CKD 3, she has worked with nephrology, taken off lisinopril due to hypotension, blood pressure getting borderline today. He will monitor and if it goes above 140/90 we will restart low-dose lisinopril.

## 2022-04-05 DIAGNOSIS — R059 Cough, unspecified: Secondary | ICD-10-CM | POA: Diagnosis not present

## 2022-04-05 DIAGNOSIS — K219 Gastro-esophageal reflux disease without esophagitis: Secondary | ICD-10-CM | POA: Diagnosis not present

## 2022-04-06 ENCOUNTER — Other Ambulatory Visit: Payer: Self-pay | Admitting: Sports Medicine

## 2022-04-06 DIAGNOSIS — R053 Chronic cough: Secondary | ICD-10-CM

## 2022-04-12 ENCOUNTER — Encounter: Payer: Self-pay | Admitting: Sports Medicine

## 2022-04-12 ENCOUNTER — Other Ambulatory Visit: Payer: Self-pay

## 2022-04-12 DIAGNOSIS — G250 Essential tremor: Secondary | ICD-10-CM

## 2022-04-12 MED ORDER — PRIMIDONE 50 MG PO TABS: 50.0000 mg | ORAL_TABLET | Freq: Every day | ORAL | 1 refills | Status: DC

## 2022-04-14 ENCOUNTER — Other Ambulatory Visit: Payer: Self-pay

## 2022-04-14 DIAGNOSIS — G250 Essential tremor: Secondary | ICD-10-CM

## 2022-04-14 MED ORDER — PRIMIDONE 50 MG PO TABS: 50.0000 mg | ORAL_TABLET | Freq: Every day | ORAL | 1 refills | Status: DC

## 2022-04-18 ENCOUNTER — Other Ambulatory Visit: Payer: Self-pay

## 2022-04-18 MED ORDER — BUSPIRONE HCL 5 MG PO TABS: ORAL_TABLET | ORAL | 0 refills | Status: DC

## 2022-04-20 ENCOUNTER — Other Ambulatory Visit: Payer: Self-pay

## 2022-04-20 ENCOUNTER — Encounter: Payer: Self-pay | Admitting: Sports Medicine

## 2022-04-20 DIAGNOSIS — R053 Chronic cough: Secondary | ICD-10-CM

## 2022-04-20 MED ORDER — IPRATROPIUM BROMIDE 0.06 % NA SOLN: 2.0000 | Freq: Three times a day (TID) | NASAL | 1 refills | Status: AC

## 2022-04-20 MED ORDER — BUSPIRONE HCL 5 MG PO TABS: ORAL_TABLET | ORAL | 0 refills | Status: DC

## 2022-05-03 DIAGNOSIS — E119 Type 2 diabetes mellitus without complications: Secondary | ICD-10-CM | POA: Diagnosis not present

## 2022-05-03 DIAGNOSIS — H524 Presbyopia: Secondary | ICD-10-CM | POA: Diagnosis not present

## 2022-05-08 ENCOUNTER — Encounter: Payer: Self-pay | Admitting: Sports Medicine

## 2022-05-08 ENCOUNTER — Other Ambulatory Visit: Payer: Self-pay

## 2022-05-08 DIAGNOSIS — E119 Type 2 diabetes mellitus without complications: Secondary | ICD-10-CM

## 2022-05-08 DIAGNOSIS — N401 Enlarged prostate with lower urinary tract symptoms: Secondary | ICD-10-CM

## 2022-05-08 MED ORDER — METFORMIN HCL 1000 MG PO TABS: 500.0000 mg | ORAL_TABLET | Freq: Two times a day (BID) | ORAL | 1 refills | Status: DC

## 2022-05-08 MED ORDER — METFORMIN HCL 1000 MG PO TABS: 500.0000 mg | ORAL_TABLET | Freq: Two times a day (BID) | ORAL | 0 refills | Status: DC

## 2022-05-08 MED ORDER — TAMSULOSIN HCL 0.4 MG PO CAPS: 0.4000 mg | ORAL_CAPSULE | Freq: Every day | ORAL | 1 refills | Status: DC

## 2022-05-08 MED ORDER — ROSUVASTATIN CALCIUM 20 MG PO TABS: 20.0000 mg | ORAL_TABLET | Freq: Every day | ORAL | 1 refills | Status: DC

## 2022-05-12 ENCOUNTER — Ambulatory Visit (INDEPENDENT_AMBULATORY_CARE_PROVIDER_SITE_OTHER): Payer: Medicare HMO | Admitting: Pharmacist

## 2022-05-12 DIAGNOSIS — G250 Essential tremor: Secondary | ICD-10-CM

## 2022-05-12 DIAGNOSIS — E119 Type 2 diabetes mellitus without complications: Secondary | ICD-10-CM

## 2022-05-12 NOTE — Progress Notes (Signed)
Chronic Care Management Pharmacy Note  05/12/2022 Name:  James Frye MRN:  [id number] DOB:  1950-08-28  Summary: addressed DM, tremor. Patient had no concerns regarding medication, no recent changes, and is doing well.  Recommendations/Changes made from today's visit:  No changes at this time.  Plan: f/u with pharmacist in 6-12 months or as needed  Subjective: James Frye is an 71 y.o. year old male who is a primary patient of [name], [name], MD.  The CCM team was consulted for assistance with disease management and care coordination needs.    Engaged with patient by telephone for follow up visit in response to provider referral for pharmacy case management and/or care coordination services.   Consent to Services:  The patient was given information about Chronic Care Management services, agreed to services, and gave verbal consent prior to initiation of services.  Please see initial visit note for detailed documentation.   Patient Care Team: Silverio Decamp, MD as PCP - General (Family Medicine) Darius Bump, [facility] as Pharmacist (Pharmacist)  Recent office visits:  02/23/21 [name] NP- Patient seen for a Benign essential tremor. Patient started Buspirone HCI 5-10 MG daily. Return for mood and essential tremor follow up with Dr. Darene Lamer in 2-3 weeks.   02/18/21 [name] MD(PCP)- Patient was seen for Benign essential tremor. Patient Primidone increased to 50 mg daily. Follow up in 4 weeks.   01/20/21 [name] MD- Patient was seen for Benign essential tremor. Patient started on Primidone 25 MD at bedtime. Follow up in 4 weeks.   12/28/20 [name] NP- Patient was seen for Rash. Labs were ordered and Taper of Prednisone 60 MG was started. Follow up if symptoms worsen.   12/03/20 [name] MD- Patient was seen for Generalized anxiety disorder. Follow up in February 2023 for annual physical.   10/22/20 [name] MD- Patient was  seen for Chronic Cough. Patient preferred to discontinue Fluticasone. Referral for Behavioral Health was placed. Follow up in 6 months.   Recent consult visits:  10/28/20 [name] MD (Pulm)- Patient was seen for Chronic cough. Follow up in 6 months.   Hospital visits:  None in previous 6 months  Objective:  Lab Results  Component Value Date   CREATININE 1.84 (H) 11/21/2021   CREATININE 1.78 (H) 10/04/2021   CREATININE 1.36 (H) 09/24/2020    Lab Results  Component Value Date   HGBA1C 6.2 02/15/2022   Last diabetic Eye exam:  Lab Results  Component Value Date/Time   HMDIABEYEEXA No Retinopathy 08/25/2021 12:00 AM       Component Value Date/Time   CHOL 159 10/04/2021 0000   TRIG 47 10/04/2021 0000   HDL 94 10/04/2021 0000   CHOLHDL 1.7 10/04/2021 0000   VLDL 9 06/27/2016 0839   LDLCALC 52 10/04/2021 0000       Latest Ref Rng & Units 10/04/2021   12:00 AM 09/24/2020    9:39 AM 05/28/2020   10:54 AM  Hepatic Function  Total Protein 6.1 - 8.1 g/dL 6.5  6.9  7.7   AST 10 - 35 U/L '21  12  14   '$ ALT 9 - 46 U/L '24  14  11   '$ Total Bilirubin 0.2 - 1.2 mg/dL 0.5  0.5  0.7     Lab Results  Component Value Date/Time   TSH 0.87 05/28/2020 10:54 AM   TSH 1.19 01/30/2019 09:26 AM   FREET4 1.16 10/25/2012 02:31 PM  Latest Ref Rng & Units 10/04/2021   12:00 AM 12/28/2020   12:00 AM 09/24/2020    9:39 AM  CBC  WBC 3.8 - 10.8 Thousand/uL 5.6  4.7  10.5   Hemoglobin 13.2 - 17.1 g/dL 11.6  12.0  12.1   Hematocrit 38.5 - 50.0 % 35.5  37.1  37.0   Platelets 140 - 400 Thousand/uL 237  222  268     Social History   Tobacco Use  Smoking Status Former  Smokeless Tobacco Never   BP Readings from Last 3 Encounters:  03/29/22 133/78  03/10/22 115/75  02/15/22 (!) 144/88   Pulse Readings from Last 3 Encounters:  03/29/22 73  03/10/22 78  02/15/22 60   Wt Readings from Last 3 Encounters:  03/29/22 186 lb (84.4 kg)  03/10/22 182 lb (82.6 kg)  02/15/22 189 lb  (85.7 kg)    Assessment: Review of patient past medical history, allergies, medications, health status, including review of consultants reports, laboratory and other test data, was performed as part of comprehensive evaluation and provision of chronic care management services.   SDOH:  (Social Determinants of Health) assessments and interventions performed:  SDOH Interventions    Flowsheet Row Office Visit from 08/12/2021 in [place] Interventions Intervention Not Indicated  Housing Interventions Intervention Not Indicated  Transportation Interventions Intervention Not Indicated  Financial Strain Interventions Intervention Not Indicated  Physical Activity Interventions Intervention Not Indicated  Stress Interventions Intervention Not Indicated  Social Connections Interventions Intervention Not Indicated       CCM Care Plan  Allergies  Allergen Reactions   Penicillins Rash    Medications Reviewed Today     Reviewed by Dema Severin, CMA (Certified Medical Assistant) on 03/10/22 at [place] List Status: <None>   Medication Order Taking? Sig Documenting Provider Last Dose Status Informant  busPIRone (BUSPAR) 5 MG tablet [id number] No TAKE 1 TO 2 TABLETS(5 TO 10 MG) BY MOUTH TWICE DAILY Silverio Decamp, MD Taking Active   Continuous Blood Gluc Sensor (FREESTYLE LIBRE 3 SENSOR) [place] [id number] No 1 each by Does not apply route in the morning, at noon, and at bedtime. Place 1 sensor on the skin every 14 days. Use to check glucose continuously. Silverio Decamp, MD Taking Active   hydrOXYzine (ATARAX) 50 MG tablet [id number] No Take 1 tablet (50 mg total) by mouth at bedtime. Silverio Decamp, MD Taking Active   ipratropium (ATROVENT) 0.06 % nasal spray [id number] No Place 2 sprays into both nostrils 3 (three) times daily. Silverio Decamp, MD Taking Active   lisinopril (ZESTRIL) 2.5  MG tablet [id number] No Take 1 tablet (2.5 mg total) by mouth daily. Silverio Decamp, MD Taking Active   metFORMIN (GLUCOPHAGE) 1000 MG tablet [id number] No Take 1 tablet by mouth once daily with breakfast Silverio Decamp, MD Taking Active   primidone (MYSOLINE) 50 MG tablet [id number] No Take 1 tablet (50 mg total) by mouth at bedtime. Silverio Decamp, MD Taking Active   rosuvastatin (CRESTOR) 20 MG tablet [id number] No Take 1 tablet (20 mg total) by mouth daily. Silverio Decamp, MD Taking Active   tamsulosin [facility]) 0.4 MG CAPS capsule [id number] No Take 1 capsule (0.4 mg total) by mouth daily after breakfast. Silverio Decamp, MD Taking Active             Patient Active Problem List   Diagnosis Date Noted  Vitreous floater, bilateral 03/29/2022   Impingement syndrome, shoulder, right 02/15/2022   Chronic renal insufficiency, stage III (moderate) (HCC) 10/06/2021   Benign essential tremor 01/20/2021   Obstructive sleep apnea 10/22/2020   Generalized anxiety disorder 10/22/2020   Seborrheic keratosis 04/25/2019   Dupuytrens contracture 10/22/2018   Normocytic anemia 05/04/2018   Vertigo 05/02/2018   Right cervical radiculopathy 06/27/2016   Benign prostate hyperplasia 12/29/2015   Chronic cough 11/23/2015   CAD (coronary artery disease) 07/22/2014   Annual physical exam 11/07/2012   Insomnia 11/07/2012   Perennial allergic rhinitis 11/07/2012   H/O gastric bypass 10/09/2012   Erectile dysfunction associated with type 2 diabetes mellitus ([place]) 11/14/2011   S/P stroke due to cerebrovascular disease 11/14/2011   Diabetes mellitus type 2, controlled ([place]) 11/14/2011    Immunization History  Administered Date(s) Administered   Fluad Quad(high Dose 65+) 04/25/2019, 05/05/2020   Influenza Split 06/20/2012   Influenza, High Dose Seasonal PF 06/06/2018   Influenza,inj,Quad PF,6+ Mos 07/03/2013, 06/18/2017   Influenza-Unspecified 07/29/2021    PFIZER(Purple Top)SARS-COV-2 Vaccination 10/05/2019, 10/28/2019, 06/14/2020   Pfizer Covid-19 Vaccine Bivalent Booster 48yr & up 07/22/2021, 03/09/2022   Pneumococcal Polysaccharide-23 11/07/2012, 06/06/2018   Tdap 08/14/2010, 02/23/2014   Zoster Recombinat (Shingrix) 02/01/2021, 05/19/2021   Zoster, Live 11/07/2012    Conditions to be addressed/monitored: DMII and essential tremor  There are no care plans that you recently modified to display for this patient.        Medication Assistance: None required.  Patient affirms current coverage meets needs.  [facility]  [id number] [address] [facility] [address]Phone: [phone]: [phone] [place] [address]Phone: [phone]: [address]Phone: [phone]: [phone] [facility] [place] [address] [facility] [address]Phone: [phone]: [phone]  Uses pill box? No - leaves in original bottles, keeps AM medicine one side of counter, PM on other side. Working well for him. Pt endorses 100% compliance  Follow Up:  Patient agrees to Care Plan and Follow-up.  Plan: Telephone follow up appointment with care management team member scheduled for:  6-12 month  KLarinda Buttery PharmD Clinical Pharmacist [phone]

## 2022-05-12 NOTE — Patient Instructions (Signed)
Visit Information  Thank you for taking time to visit with me today. Please don't hesitate to contact me if I can be of assistance to you before our next scheduled telephone appointment.  Following are the goals we discussed today:   Patient Goals/Self-Care Activities Over the next 180 days, patient will:  take medications as prescribed  Follow Up Plan: Telephone follow up appointment with care management team member scheduled for: 6-12 months or as needed    Patient verbalizes understanding of instructions and care plan provided today and agrees to view in [place]. Active MyChart status and patient understanding of how to access instructions and care plan via MyChart confirmed with patient.     James Frye

## 2022-05-13 DIAGNOSIS — E119 Type 2 diabetes mellitus without complications: Secondary | ICD-10-CM

## 2022-05-16 DIAGNOSIS — K219 Gastro-esophageal reflux disease without esophagitis: Secondary | ICD-10-CM | POA: Diagnosis not present

## 2022-05-16 DIAGNOSIS — R059 Cough, unspecified: Secondary | ICD-10-CM | POA: Diagnosis not present

## 2022-05-28 ENCOUNTER — Encounter: Payer: Self-pay | Admitting: Sports Medicine

## 2022-06-08 DIAGNOSIS — N529 Male erectile dysfunction, unspecified: Secondary | ICD-10-CM | POA: Diagnosis not present

## 2022-06-08 DIAGNOSIS — N400 Enlarged prostate without lower urinary tract symptoms: Secondary | ICD-10-CM | POA: Diagnosis not present

## 2022-06-13 DIAGNOSIS — D472 Monoclonal gammopathy: Secondary | ICD-10-CM | POA: Diagnosis not present

## 2022-06-13 DIAGNOSIS — N179 Acute kidney failure, unspecified: Secondary | ICD-10-CM | POA: Diagnosis not present

## 2022-06-19 ENCOUNTER — Encounter: Payer: Self-pay | Admitting: Sports Medicine

## 2022-06-19 DIAGNOSIS — R9431 Abnormal electrocardiogram [ECG] [EKG]: Secondary | ICD-10-CM | POA: Diagnosis not present

## 2022-06-19 DIAGNOSIS — N289 Disorder of kidney and ureter, unspecified: Secondary | ICD-10-CM | POA: Diagnosis not present

## 2022-06-19 DIAGNOSIS — R0789 Other chest pain: Secondary | ICD-10-CM | POA: Diagnosis not present

## 2022-06-19 DIAGNOSIS — E119 Type 2 diabetes mellitus without complications: Secondary | ICD-10-CM | POA: Diagnosis not present

## 2022-06-19 DIAGNOSIS — R880 Cloudy (hemodialysis) (peritoneal) dialysis effluent: Secondary | ICD-10-CM | POA: Diagnosis not present

## 2022-06-19 DIAGNOSIS — R079 Chest pain, unspecified: Secondary | ICD-10-CM | POA: Diagnosis not present

## 2022-06-19 DIAGNOSIS — Z9884 Bariatric surgery status: Secondary | ICD-10-CM | POA: Diagnosis not present

## 2022-06-19 DIAGNOSIS — Z79899 Other long term (current) drug therapy: Secondary | ICD-10-CM | POA: Diagnosis not present

## 2022-06-19 DIAGNOSIS — R7989 Other specified abnormal findings of blood chemistry: Secondary | ICD-10-CM | POA: Diagnosis not present

## 2022-06-19 NOTE — Telephone Encounter (Signed)
Called patient - states has chest pain with any movements and with deep inhalations. Denies  jaw or arm pain. Positive for shortness of breath- states pain started after nausea and vomiting episode yesterday. Patient informed to go to ER for evaluation.

## 2022-07-10 DIAGNOSIS — N179 Acute kidney failure, unspecified: Secondary | ICD-10-CM | POA: Diagnosis not present

## 2022-07-11 ENCOUNTER — Ambulatory Visit (INDEPENDENT_AMBULATORY_CARE_PROVIDER_SITE_OTHER): Payer: Managed Care, Other (non HMO) | Admitting: Sports Medicine

## 2022-07-11 ENCOUNTER — Encounter: Payer: Self-pay | Admitting: Sports Medicine

## 2022-07-11 VITALS — BP 123/77 | HR 86 | Ht 67.0 in | Wt 188.0 lb

## 2022-07-11 DIAGNOSIS — S29011A Strain of muscle and tendon of front wall of thorax, initial encounter: Secondary | ICD-10-CM | POA: Diagnosis not present

## 2022-07-11 DIAGNOSIS — Z23 Encounter for immunization: Secondary | ICD-10-CM | POA: Diagnosis not present

## 2022-07-11 DIAGNOSIS — Z0000 Encounter for general adult medical examination without abnormal findings: Secondary | ICD-10-CM

## 2022-07-11 DIAGNOSIS — N2889 Other specified disorders of kidney and ureter: Secondary | ICD-10-CM

## 2022-07-11 DIAGNOSIS — N1832 Chronic kidney disease, stage 3b: Secondary | ICD-10-CM

## 2022-07-11 NOTE — Progress Notes (Signed)
    Procedures performed today:    None.  Independent interpretation of notes and tests performed by another provider:   None.  Brief History, Exam, Impression, and Recommendations:    Strain of left pectoralis muscle This is a pleasant 71 year old male, he was recently seen in the emergency department for left upper chest pain, he tells me the pain typically occurs when trying to pull down the garage door, and he feels it midsternum. It is not exertional. He had the typical workup in the ED including a chest x-ray, CT angiogram of the pulmonary arteries, troponins, all of which were unrevealing. He did have a nuclear stress test 2 years ago that was negative for inducible ischemia. On exam today he has tenderness along the mid sternum reproducible with firing of the pectoralis major, I will give him some pectoralis major conditioning exercises and we can just watch this for now.  Chronic renal insufficiency, stage III (moderate) (HCC) James Frye does also have CKD 3, he had some slightly worsening renal function with a creatinine of 2.3 several days ago, yesterday it was about 2.0. He will follow this up with his nephrologist though I would like to see him back in February for routine labs.  Annual physical exam Return in February for routine labs, fasting annual physical. Flu shot given today.    ____________________________________________ [name]. Dianah Field, M.D., ABFM., CAQSM., AME. Primary Care and Sports Medicine [place] [facility]  Adjunct Professor of [place] [employer] [facility] of Medicine  Risk manager

## 2022-07-11 NOTE — Assessment & Plan Note (Signed)
James Frye does also have CKD 3, he had some slightly worsening renal function with a creatinine of 2.3 several days ago, yesterday it was about 2.0. He will follow this up with his nephrologist though I would like to see him back in February for routine labs.

## 2022-07-11 NOTE — Assessment & Plan Note (Signed)
Return in February for routine labs, fasting annual physical. Flu shot given today.

## 2022-07-11 NOTE — Assessment & Plan Note (Signed)
This is a pleasant 71 year old male, he was recently seen in the emergency department for left upper chest pain, he tells me the pain typically occurs when trying to pull down the garage door, and he feels it midsternum. It is not exertional. He had the typical workup in the ED including a chest x-ray, CT angiogram of the pulmonary arteries, troponins, all of which were unrevealing. He did have a nuclear stress test 2 years ago that was negative for inducible ischemia. On exam today he has tenderness along the mid sternum reproducible with firing of the pectoralis major, I will give him some pectoralis major conditioning exercises and we can just watch this for now.

## 2022-07-20 DIAGNOSIS — D472 Monoclonal gammopathy: Secondary | ICD-10-CM | POA: Diagnosis not present

## 2022-07-20 DIAGNOSIS — N179 Acute kidney failure, unspecified: Secondary | ICD-10-CM | POA: Diagnosis not present

## 2022-07-25 DIAGNOSIS — I10 Essential (primary) hypertension: Secondary | ICD-10-CM | POA: Diagnosis not present

## 2022-07-25 DIAGNOSIS — D631 Anemia in chronic kidney disease: Secondary | ICD-10-CM | POA: Diagnosis not present

## 2022-07-25 DIAGNOSIS — N1832 Chronic kidney disease, stage 3b: Secondary | ICD-10-CM | POA: Diagnosis not present

## 2022-08-09 DIAGNOSIS — K565 Intestinal adhesions [bands], unspecified as to partial versus complete obstruction: Secondary | ICD-10-CM | POA: Diagnosis not present

## 2022-08-09 DIAGNOSIS — K56609 Unspecified intestinal obstruction, unspecified as to partial versus complete obstruction: Secondary | ICD-10-CM | POA: Diagnosis not present

## 2022-08-10 DIAGNOSIS — N1832 Chronic kidney disease, stage 3b: Secondary | ICD-10-CM

## 2022-08-10 DIAGNOSIS — D631 Anemia in chronic kidney disease: Secondary | ICD-10-CM

## 2022-08-10 HISTORY — DX: Chronic kidney disease, stage 3b: N18.32

## 2022-08-10 HISTORY — DX: Anemia in chronic kidney disease: D63.1

## 2022-08-10 LAB — COMPREHENSIVE METABOLIC PANEL: eGFR: 41

## 2022-08-10 LAB — HEMOGLOBIN A1C: Hemoglobin A1C: 6.3

## 2022-08-13 ENCOUNTER — Encounter: Payer: Self-pay | Admitting: Sports Medicine

## 2022-08-15 ENCOUNTER — Ambulatory Visit (INDEPENDENT_AMBULATORY_CARE_PROVIDER_SITE_OTHER): Payer: Medicare HMO | Admitting: Sports Medicine

## 2022-08-15 ENCOUNTER — Telehealth: Payer: Self-pay | Admitting: General Practice

## 2022-08-15 ENCOUNTER — Telehealth: Payer: Self-pay

## 2022-08-15 DIAGNOSIS — Z0000 Encounter for general adult medical examination without abnormal findings: Secondary | ICD-10-CM

## 2022-08-15 NOTE — Telephone Encounter (Signed)
Patient has a hospital follow up scheduled for 08/18/2022, patient needs a work note for the reason he is out of work until his appointment on Friday 08/18/2022, please advise, thanks.

## 2022-08-15 NOTE — Telephone Encounter (Signed)
Transition Care Management Follow-up Telephone Call Date of discharge and from where: 08/11/22 from [place] How have you been since you were released from the hospital? Had an admission for small bowel obstruction. Doing a little better. Still having some cramps but has had a BM and is passing gas. Scheduled for a follow up on 08/18/22. Any questions or concerns? No  Items Reviewed: Did the pt receive and understand the discharge instructions provided? Yes  Medications obtained and verified? Yes  Other? No  Any new allergies since your discharge? No  Dietary orders reviewed? Yes Do you have support at home? Yes   Home Care and Equipment/Supplies: Were home health services ordered? no   Functional Questionnaire: (I = Independent and D = Dependent) ADLs: I  Bathing/Dressing- I  Meal Prep- I  Eating- I  Maintaining continence- I  Transferring/Ambulation- I  Managing Meds- I  Follow up appointments reviewed:  PCP Hospital f/u appt confirmed? Yes  Scheduled to see Dr. Darene Lamer on 08/18/22 @ 1115. [place] f/u appt confirmed? No   Are transportation arrangements needed? No  If their condition worsens, is the pt aware to call PCP or go to the Emergency Dept.? Yes Was the patient provided with contact information for the PCP's office or ED? Yes Was to pt encouraged to call back with questions or concerns? Yes

## 2022-08-15 NOTE — Progress Notes (Signed)
MEDICARE ANNUAL WELLNESS VISIT  08/15/2022  Telephone Visit Disclaimer This Medicare AWV was conducted by telephone due to national recommendations for restrictions regarding the COVID-19 Pandemic (e.g. social distancing).  I verified, using two identifiers, that I am speaking with James Frye or their authorized healthcare agent. I discussed the limitations, risks, security, and privacy concerns of performing an evaluation and management service by telephone and the potential availability of an in-person appointment in the future. The patient expressed understanding and agreed to proceed.  Location of Patient: home Location of Provider (nurse):  provider home  Subjective:    James Frye is a 72 y.o. male patient of [name], [name], MD who had a Medicare Annual Wellness Visit today via telephone. James Frye is Working full time and lives with their family. he has 3 children. he reports that he is socially active and does interact with friends/family regularly. he is minimally physically active and enjoys working.  Patient Frye Team: Silverio Decamp, MD as PCP - General (Family Medicine) Darius Bump, [facility] as Pharmacist (Pharmacist)     08/15/2022    9:07 AM 08/12/2021   10:17 AM 10/23/2018    3:19 PM 05/08/2018   10:33 AM 05/17/2016    9:29 PM 12/24/2013    9:44 AM  Advanced Directives  Does Patient Have a Medical Advance Directive? No Yes No No No Patient does not have advance directive;Patient would like information  Type of Advance Directive  Living will;Healthcare Power of Attorney      Does patient want to make changes to medical advance directive?  No - Patient declined      Copy of [place] in Chart?  No - copy requested      Would patient like information on creating a medical advance directive? No - Patient declined  No - Patient declined No - Patient declined No - patient declined information     Hospital Utilization Over the Past  12 Months: # of hospitalizations or ER visits: 2 # of surgeries: 0  Review of Systems    Patient reports that his overall health is unchanged compared to last year.  History obtained from chart review and the patient  Patient Reported Readings (BP, Pulse, CBG, Weight, etc) none  Pain Assessment Pain : 0-10 Pain Score: 4  Pain Type: Acute pain Pain Location: Abdomen Pain Descriptors / Indicators: Cramping Pain Onset: 1 to 4 weeks ago Pain Frequency: Intermittent Pain Relieving Factors: none  Pain Relieving Factors: none  Current Medications & Allergies (verified) Allergies as of 08/15/2022       Reactions   Penicillins Rash        Medication List        Accurate as of August 15, 2022  9:17 AM. If you have any questions, ask your nurse or doctor.          busPIRone 5 MG tablet Commonly known as: BUSPAR TAKE 1 TO 2 TABLETS(5 TO 10 MG) BY MOUTH TWICE DAILY   calcium carbonate 1250 (500 Ca) MG chewable tablet Commonly known as: OS-CAL Chew by mouth 2 (two) times daily with a meal.   famotidine 20 MG tablet Commonly known as: PEPCID Take 20 mg by mouth.   fluticasone 50 MCG/ACT nasal spray Commonly known as: FLONASE Place 2 sprays into both nostrils daily.   FreeStyle Libre 3 Sensor Misc 1 each by Does not apply route in the morning, at noon, and at bedtime. Place 1 sensor on  the skin every 14 days. Use to check glucose continuously.   hydrOXYzine 50 MG tablet Commonly known as: ATARAX Take 1 tablet (50 mg total) by mouth at bedtime.   ipratropium 0.06 % nasal spray Commonly known as: ATROVENT Place 2 sprays into both nostrils 3 (three) times daily.   Jardiance 10 MG Tabs tablet Generic drug: empagliflozin Take 10 mg by mouth daily.   losartan 25 MG tablet Commonly known as: COZAAR Take by mouth.   metFORMIN 1000 MG tablet Commonly known as: GLUCOPHAGE Take 0.5 tablets (500 mg total) by mouth 2 (two) times daily with a meal.   omeprazole 40 MG  capsule Commonly known as: PRILOSEC Take 40 mg by mouth at bedtime.   primidone 50 MG tablet Commonly known as: MYSOLINE Take 1 tablet (50 mg total) by mouth at bedtime.   rosuvastatin 20 MG tablet Commonly known as: CRESTOR Take 1 tablet (20 mg total) by mouth daily. What changed: how much to take   tamsulosin 0.4 MG Caps capsule Commonly known as: Flomax Take 1 capsule (0.4 mg total) by mouth daily after breakfast.        History (reviewed): Past Medical History:  Diagnosis Date   Allergy not known, long ago   CAD (coronary artery disease)    Diabetes mellitus    Hiatal hernia    Past Surgical History:  Procedure Laterality Date   APPENDECTOMY     GASTRIC BYPASS     Family History  Problem Relation Age of Onset   Cancer Mother    Stroke Father    Heart disease Father    Social History   Socioeconomic History   Marital status: Married    Spouse name: Archie Patten   Number of children: 3   Years of education: 14   Highest education level: Some college, no degree  Occupational History    Comment: works full time  Tobacco Use   Smoking status: Former   Smokeless tobacco: Never  Scientific laboratory technician Use: Never used  Substance and Sexual Activity   Alcohol use: No   Drug use: No   Sexual activity: Not Currently  Other Topics Concern   Not on file  Social History Narrative   Lives with his wife and step-son. He works two full-time jobs. He enjoys working and doesn't have time for any hobbies.    Social Determinants of Health   Financial Resource Strain: Low Risk  (08/11/2022)   Overall Financial Resource Strain (CARDIA)    Difficulty of Paying Living Expenses: Not hard at all  Food Insecurity: No Food Insecurity (08/11/2022)   Hunger Vital Sign    Worried About Running Out of Food in the Last Year: Never true    Ran Out of Food in the Last Year: Never true  Transportation Needs: No Transportation Needs (08/11/2022)   PRAPARE - Radiographer, therapeutic (Medical): No    Lack of Transportation (Non-Medical): No  Physical Activity: Inactive (08/11/2022)   Exercise Vital Sign    Days of Exercise per Week: 0 days    Minutes of Exercise per Session: 0 min  Stress: No Stress Concern Present (08/11/2022)   [place]    Feeling of Stress : Not at all  Social Connections: [place] (08/15/2022)   Social Connection and Isolation Panel [NHANES]    Frequency of Communication with Friends and Family: Twice a week    Frequency of Social Gatherings  with Friends and Family: Once a week    Attends Religious Services: More than 4 times per year    Active Member of Clubs or Organizations: Yes    Attends Archivist Meetings: Never    Marital Status: Married    Activities of Daily Living    08/11/2022    6:43 PM  In your present state of health, do you have any difficulty performing the following activities:  Hearing? 0  Vision? 0  Difficulty concentrating or making decisions? 0  Walking or climbing stairs? 0  Dressing or bathing? 0  Doing errands, shopping? 0  Preparing Food and eating ? N  Using the Toilet? N  In the past six months, have you accidently leaked urine? N  Do you have problems with loss of bowel control? N  Managing your Finances? N  Housekeeping or managing your Housekeeping? N    Patient Education/ Literacy How often do you need to have someone help you when you read instructions, pamphlets, or other written materials from your doctor or pharmacy?: 1 - Never What is the last grade level you completed in school?: 1 year of college  Exercise Current Exercise Habits: The patient does not participate in regular exercise at present, Exercise limited by: None identified  Diet Patient reports consuming 2 meals a day and 2 snack(s) a day Patient reports that his primary diet is: Regular Patient reports that she does have regular  access to food.   Depression Screen    08/15/2022    8:54 AM 07/11/2022   11:07 AM 08/12/2021   10:18 AM 02/23/2021    4:19 PM 01/20/2021   12:00 PM 12/03/2020    9:21 AM 09/24/2020    9:12 AM  PHQ 2/9 Scores  PHQ - 2 Score 0 0 0 0 0 0 0  PHQ- 9 Score    6 3 0      Fall Risk    08/15/2022    8:54 AM 08/11/2022    6:43 PM 07/11/2022   11:07 AM 08/12/2021   10:18 AM 08/10/2021   11:51 AM  Fall Risk   Falls in the past year? 0 0 0 0 0  Number falls in past yr: 0 0 0 0 0  Injury with Fall? 0 0 0 0 0  Risk for fall due to : No Fall Risks   No Fall Risks   Follow up Falls evaluation completed  Falls evaluation completed Education provided;Falls evaluation completed      Objective:  James Frye seemed alert and oriented and he participated appropriately during our telephone visit.  Blood Pressure Weight BMI  BP Readings from Last 3 Encounters:  07/11/22 123/77  03/29/22 133/78  03/10/22 115/75   Wt Readings from Last 3 Encounters:  07/11/22 188 lb (85.3 kg)  03/29/22 186 lb (84.4 kg)  03/10/22 182 lb (82.6 kg)   BMI Readings from Last 1 Encounters:  07/11/22 29.44 kg/m    *Unable to obtain current vital signs, weight, and BMI due to telephone visit type  Hearing/Vision  James Frye did not seem to have difficulty with hearing/understanding during the telephone conversation Reports that he has had a formal eye exam by an eye Frye professional within the past year Reports that he has not had a formal hearing evaluation within the past year *Unable to fully assess hearing and vision during telephone visit type  Cognitive Function:    08/15/2022    9:10 AM 08/12/2021   10:28 AM  6CIT Screen  What Year? 0 points 0 points  What month? 0 points 0 points  What time? 0 points 0 points  Count back from 20 0 points 0 points  Months in reverse 0 points 0 points  Repeat phrase 0 points 0 points  Total Score 0 points 0 points   (Normal:0-7, Significant for Dysfunction:  >8)  Normal Cognitive Function Screening: Yes   Immunization & Health Maintenance Record Immunization History  Administered Date(s) Administered   Fluad Quad(high Dose 65+) 04/25/2019, 05/05/2020, 07/11/2022   Influenza Split 06/20/2012   Influenza, High Dose Seasonal PF 06/06/2018   Influenza,inj,Quad PF,6+ Mos 07/03/2013, 06/18/2017   Influenza-Unspecified 07/29/2021   PFIZER(Purple Top)SARS-COV-2 Vaccination 10/05/2019, 10/28/2019, 06/14/2020   Pfizer Covid-19 Vaccine Bivalent Booster 29yr & up 07/22/2021, 03/09/2022   Pneumococcal Polysaccharide-23 11/07/2012, 06/06/2018   Tdap 08/14/2010, 02/23/2014   Zoster Recombinat (Shingrix) 02/01/2021, 05/19/2021   Zoster, Live 11/07/2012    Health Maintenance  Topic Date Due   COVID-19 Vaccine (6 - 2023-24 season) 08/31/2022 (Originally 05/04/2022)   Pneumonia Vaccine 70 Years old (3 - PCV) 08/16/2023 (Originally 06/07/2019)   HEMOGLOBIN A1C  08/18/2022   OPHTHALMOLOGY EXAM  08/25/2022   Diabetic kidney evaluation - eGFR measurement  11/22/2022   Diabetic kidney evaluation - Urine ACR  02/16/2023   FOOT EXAM  02/16/2023   Medicare Annual Wellness (AWV)  08/16/2023   DTaP/Tdap/Td (3 - Td or Tdap) 02/24/2024   COLONOSCOPY (Pts 45-482yrInsurance coverage will need to be confirmed)  08/21/2028   INFLUENZA VACCINE  Completed   Hepatitis C Screening  Completed   Zoster Vaccines- Shingrix  Completed   HPV VACCINES  Aged Out       Assessment  This is a routine wellness examination for James Frye Health Maintenance: Due or Overdue There are no preventive Frye reminders to display for this patient.   James Frye not need a referral for Community Assistance: Frye Management:   no Social Work:    no Prescription Assistance:  no Nutrition/Diabetes Education:  no   Plan:  Personalized Goals  Goals Addressed               This Visit's Progress     Patient Stated (pt-stated)        Patient stated that he  would like to work on getting off of some of his medications.       Personalized Health Maintenance & Screening Recommendations  Pneumonia vaccine  Lung Cancer Screening Recommended: no (Low Dose CT Chest recommended if Age 70-80ears, 30 pack-year currently smoking OR have quit w/in past 15 years) Hepatitis C Screening recommended: no HIV Screening recommended: no  Advanced Directives: Written information was not prepared per patient's request.  Referrals & Orders No orders of the defined types were placed in this encounter.   Follow-up Plan Follow-up with ThSilverio DecampMD as planned Discuss pneumonia vaccine with PCP.  Medicare wellness visit in one year.  Patient will access AVS on my chart.   I have personally reviewed and noted the following in the patient's chart:   Medical and social history Use of alcohol, tobacco or illicit drugs  Current medications and supplements Functional ability and status Nutritional status Physical activity Advanced directives List of other physicians Hospitalizations, surgeries, and ER visits in previous 12 months Vitals Screenings to include cognitive, depression, and falls Referrals and appointments  In addition, I have reviewed and discussed with James Brunsertain preventive protocols, quality  metrics, and best practice recommendations. A written personalized Frye plan for preventive services as well as general preventive health recommendations is available and can be mailed to the patient at his request.      Tinnie Gens, RN BSN  08/15/2022

## 2022-08-15 NOTE — Patient Instructions (Addendum)
[place] Maintenance Summary and Written Plan of Care  Mr. James Frye ,  Thank you for allowing me to perform your Medicare Annual Wellness Visit and for your ongoing commitment to your health.   Health Maintenance & Immunization History Health Maintenance  Topic Date Due   COVID-19 Vaccine (6 - 2023-24 season) 08/31/2022 (Originally 05/04/2022)   Pneumonia Vaccine 61+ Years old (3 - PCV) 08/16/2023 (Originally 06/07/2019)   HEMOGLOBIN A1C  08/18/2022   OPHTHALMOLOGY EXAM  08/25/2022   Diabetic kidney evaluation - eGFR measurement  11/22/2022   Diabetic kidney evaluation - Urine ACR  02/16/2023   FOOT EXAM  02/16/2023   Medicare Annual Wellness (AWV)  08/16/2023   DTaP/Tdap/Td (3 - Td or Tdap) 02/24/2024   COLONOSCOPY (Pts 45-72yr Insurance coverage will need to be confirmed)  08/21/2028   INFLUENZA VACCINE  Completed   Hepatitis C Screening  Completed   Zoster Vaccines- Shingrix  Completed   HPV VACCINES  Aged Out   Immunization History  Administered Date(s) Administered   Fluad Quad(high Dose 65+) 04/25/2019, 05/05/2020, 07/11/2022   Influenza Split 06/20/2012   Influenza, High Dose Seasonal PF 06/06/2018   Influenza,inj,Quad PF,6+ Mos 07/03/2013, 06/18/2017   Influenza-Unspecified 07/29/2021   PFIZER(Purple Top)SARS-COV-2 Vaccination 10/05/2019, 10/28/2019, 06/14/2020   Pfizer Covid-19 Vaccine Bivalent Booster 187yr& up 07/22/2021, 03/09/2022   Pneumococcal Polysaccharide-23 11/07/2012, 06/06/2018   Tdap 08/14/2010, 02/23/2014   Zoster Recombinat (Shingrix) 02/01/2021, 05/19/2021   Zoster, Live 11/07/2012    These are the patient goals that we discussed:  Goals Addressed               This Visit's Progress     Patient Stated (pt-stated)        Patient stated that he would like to work on getting off of some of his medications.         This is a list of Health Maintenance Items that are overdue or due now: Pneumonia  vaccine  Orders/Referrals Placed Today: No orders of the defined types were placed in this encounter.  (Contact our referral department at [phone] you have not spoken with someone about your referral appointment within the next 5 days)    Follow-up Plan Follow-up with ThSilverio DecampMD as planned Discuss pneumonia vaccine with PCP.  Medicare wellness visit in one year.  Patient will access AVS on my chart.      Health Maintenance, Male Adopting a healthy lifestyle and getting preventive care are important in promoting health and wellness. Ask your health care provider about: The right schedule for you to have regular tests and exams. Things you can do on your own to prevent diseases and keep yourself healthy. What should I know about diet, weight, and exercise? Eat a healthy diet  Eat a diet that includes plenty of vegetables, fruits, low-fat dairy products, and lean protein. Do not eat a lot of foods that are high in solid fats, added sugars, or sodium. Maintain a healthy weight Body mass index (BMI) is a measurement that can be used to identify possible weight problems. It estimates body fat based on height and weight. Your health care provider can help determine your BMI and help you achieve or maintain a healthy weight. Get regular exercise Get regular exercise. This is one of the most important things you can do for your health. Most adults should: Exercise for at least 150 minutes each week. The exercise should increase your heart rate and make you sweat (  moderate-intensity exercise). Do strengthening exercises at least twice a week. This is in addition to the moderate-intensity exercise. Spend less time sitting. Even light physical activity can be beneficial. Watch cholesterol and blood lipids Have your blood tested for lipids and cholesterol at 70 years of age, then have this test every 5 years. You may need to have your cholesterol levels checked more  often if: Your lipid or cholesterol levels are high. You are older than 70 years of age. You are at high risk for heart disease. What should I know about cancer screening? Many types of cancers can be detected early and may often be prevented. Depending on your health history and family history, you may need to have cancer screening at various ages. This may include screening for: Colorectal cancer. Prostate cancer. Skin cancer. Lung cancer. What should I know about heart disease, diabetes, and high blood pressure? Blood pressure and heart disease High blood pressure causes heart disease and increases the risk of stroke. This is more likely to develop in people who have high blood pressure readings or are overweight. Talk with your health care provider about your target blood pressure readings. Have your blood pressure checked: Every 3-5 years if you are 69-29 years of age. Every year if you are 37 years old or older. If you are between the ages of 34 and 62 and are a current or former smoker, ask your health care provider if you should have a one-time screening for abdominal aortic aneurysm (AAA). Diabetes Have regular diabetes screenings. This checks your fasting blood sugar level. Have the screening done: Once every three years after age 47 if you are at a normal weight and have a low risk for diabetes. More often and at a younger age if you are overweight or have a high risk for diabetes. What should I know about preventing infection? Hepatitis B If you have a higher risk for hepatitis B, you should be screened for this virus. Talk with your health care provider to find out if you are at risk for hepatitis B infection. Hepatitis C Blood testing is recommended for: Everyone born from [birth year] through 1963. Anyone with known risk factors for hepatitis C. Sexually transmitted infections (STIs) You should be screened each year for STIs, including gonorrhea and chlamydia, if: You are  sexually active and are younger than 70 years of age. You are older than 70 years of age and your health care provider tells you that you are at risk for this type of infection. Your sexual activity has changed since you were last screened, and you are at increased risk for chlamydia or gonorrhea. Ask your health care provider if you are at risk. Ask your health care provider about whether you are at high risk for HIV. Your health care provider may recommend a prescription medicine to help prevent HIV infection. If you choose to take medicine to prevent HIV, you should first get tested for HIV. You should then be tested every 3 months for as long as you are taking the medicine. Follow these instructions at home: Alcohol use Do not drink alcohol if your health care provider tells you not to drink. If you drink alcohol: Limit how much you have to 0-2 drinks a day. Know how much alcohol is in your drink. In the [place] one drink equals one 12 oz bottle of beer (355 mL), one 5 oz glass of wine (148 mL), or one 1 oz glass of hard liquor (44 mL). Lifestyle Do  not use any products that contain nicotine or tobacco. These products include cigarettes, chewing tobacco, and vaping devices, such as e-cigarettes. If you need help quitting, ask your health care provider. Do not use street drugs. Do not share needles. Ask your health care provider for help if you need support or information about quitting drugs. General instructions Schedule regular health, dental, and eye exams. Stay current with your vaccines. Tell your health care provider if: You often feel depressed. You have ever been abused or do not feel safe at home. Summary Adopting a healthy lifestyle and getting preventive care are important in promoting health and wellness. Follow your health care provider's instructions about healthy diet, exercising, and getting tested or screened for diseases. Follow your health care provider's instructions on  monitoring your cholesterol and blood pressure. This information is not intended to replace advice given to you by your health care provider. Make sure you discuss any questions you have with your health care provider. Document Revised: 12/20/2020 Document Reviewed: 12/20/2020 Elsevier Patient Education  [place].

## 2022-08-15 NOTE — Telephone Encounter (Signed)
Letter written and can be downloaded from [employer]

## 2022-08-16 ENCOUNTER — Encounter: Payer: Self-pay | Admitting: Sports Medicine

## 2022-08-18 ENCOUNTER — Encounter: Payer: Self-pay | Admitting: Sports Medicine

## 2022-08-18 ENCOUNTER — Ambulatory Visit (INDEPENDENT_AMBULATORY_CARE_PROVIDER_SITE_OTHER): Payer: Medicare HMO | Admitting: Sports Medicine

## 2022-08-18 VITALS — BP 146/80 | HR 84 | Wt 185.0 lb

## 2022-08-18 DIAGNOSIS — K56609 Unspecified intestinal obstruction, unspecified as to partial versus complete obstruction: Secondary | ICD-10-CM | POA: Diagnosis not present

## 2022-08-18 DIAGNOSIS — K565 Intestinal adhesions [bands], unspecified as to partial versus complete obstruction: Secondary | ICD-10-CM

## 2022-08-18 DIAGNOSIS — G250 Essential tremor: Secondary | ICD-10-CM

## 2022-08-18 MED ORDER — BUSPIRONE HCL 5 MG PO TABS: ORAL_TABLET | ORAL | 3 refills | Status: DC

## 2022-08-18 MED ORDER — PRIMIDONE 50 MG PO TABS: 50.0000 mg | ORAL_TABLET | Freq: Every day | ORAL | 3 refills | Status: DC

## 2022-08-18 NOTE — Progress Notes (Signed)
    Procedures performed today:    None.  Independent interpretation of notes and tests performed by another provider:   None.  Brief History, Exam, Impression, and Recommendations:    Small bowel obstruction (HCC) James Frye is a pleasant 72 year old male, he is status post Roux-en-Y gastric bypass and appendectomy, late last month he was hospitalized with obstipation, he was found to have a small bowel obstruction with a transition point in the right lower quadrant likely due to adhesions from prior surgery.  Had NG tube placed, MiraLAX and the small bowel obstruction resolved after a day or 2. He was doing well but now is having increasing abdominal pain, he is passing stool and flatus. He has some tenderness to exam but his belly is soft, no guarding or rigidity. Considering increasing discomfort I would like an additional CT abdomen and pelvis with oral contrast, he will continue his MiraLAX and I would like him to get in to follow-up with general surgery with [facility].  I spent 30 minutes of total time managing this patient today, this includes chart review, face to face, and non-face to face time.  ____________________________________________ [name]. Dianah Field, M.D., ABFM., CAQSM., AME. Primary Care and Sports Medicine [facility]  Adjunct Professor of [employer] Medicine  Risk manager

## 2022-08-18 NOTE — Assessment & Plan Note (Signed)
James Frye is a pleasant 72 year old male, he is status post Roux-en-Y gastric bypass and appendectomy, late last month he was hospitalized with obstipation, he was found to have a small bowel obstruction with a transition point in the right lower quadrant likely due to adhesions from prior surgery.  Had NG tube placed, MiraLAX and the small bowel obstruction resolved after a day or 2. He was doing well but now is having increasing abdominal pain, he is passing stool and flatus. He has some tenderness to exam but his belly is soft, no guarding or rigidity. Considering increasing discomfort I would like an additional CT abdomen and pelvis with oral contrast, he will continue his MiraLAX and I would like him to get in to follow-up with general surgery with [facility].

## 2022-08-25 DIAGNOSIS — Z9884 Bariatric surgery status: Secondary | ICD-10-CM | POA: Diagnosis not present

## 2022-08-25 DIAGNOSIS — R1032 Left lower quadrant pain: Secondary | ICD-10-CM | POA: Diagnosis not present

## 2022-08-25 DIAGNOSIS — Z8719 Personal history of other diseases of the digestive system: Secondary | ICD-10-CM | POA: Diagnosis not present

## 2022-08-29 DIAGNOSIS — I129 Hypertensive chronic kidney disease with stage 1 through stage 4 chronic kidney disease, or unspecified chronic kidney disease: Secondary | ICD-10-CM | POA: Diagnosis not present

## 2022-08-29 DIAGNOSIS — Q438 Other specified congenital malformations of intestine: Secondary | ICD-10-CM | POA: Diagnosis not present

## 2022-08-29 DIAGNOSIS — Z980 Intestinal bypass and anastomosis status: Secondary | ICD-10-CM | POA: Diagnosis not present

## 2022-08-29 DIAGNOSIS — N1832 Chronic kidney disease, stage 3b: Secondary | ICD-10-CM | POA: Diagnosis not present

## 2022-08-29 DIAGNOSIS — E1122 Type 2 diabetes mellitus with diabetic chronic kidney disease: Secondary | ICD-10-CM | POA: Diagnosis not present

## 2022-08-29 DIAGNOSIS — K449 Diaphragmatic hernia without obstruction or gangrene: Secondary | ICD-10-CM | POA: Diagnosis not present

## 2022-08-29 DIAGNOSIS — K5651 Intestinal adhesions [bands], with partial obstruction: Secondary | ICD-10-CM | POA: Diagnosis not present

## 2022-08-29 DIAGNOSIS — Z79899 Other long term (current) drug therapy: Secondary | ICD-10-CM | POA: Diagnosis not present

## 2022-08-29 DIAGNOSIS — Z9884 Bariatric surgery status: Secondary | ICD-10-CM | POA: Diagnosis not present

## 2022-08-29 DIAGNOSIS — K660 Peritoneal adhesions (postprocedural) (postinfection): Secondary | ICD-10-CM | POA: Diagnosis not present

## 2022-09-01 DIAGNOSIS — K660 Peritoneal adhesions (postprocedural) (postinfection): Secondary | ICD-10-CM | POA: Diagnosis not present

## 2022-09-04 ENCOUNTER — Encounter: Payer: Self-pay | Admitting: Sports Medicine

## 2022-09-04 ENCOUNTER — Encounter: Payer: Self-pay | Admitting: *Deleted

## 2022-09-04 ENCOUNTER — Telehealth: Payer: Self-pay | Admitting: *Deleted

## 2022-09-04 NOTE — Patient Outreach (Signed)
  Care Coordination [facility] Note Transition Care Management Unsuccessful Follow-up Telephone Call  Date of discharge and from where:  Friday, 09/01/22; [place]; LLQ abdominal pain; surgical lysis of adhesions  Attempts:  1st Attempt  Reason for unsuccessful TCM follow-up call:  Left voice message  Oneta Rack, RN, BSN, CCRN Alumnus RN CM Care Coordination/ Transition of [place] Management [phone]: direct office

## 2022-09-05 ENCOUNTER — Encounter: Payer: Self-pay | Admitting: *Deleted

## 2022-09-05 ENCOUNTER — Telehealth: Payer: Self-pay | Admitting: *Deleted

## 2022-09-05 NOTE — Patient Outreach (Addendum)
  Care Coordination [facility] Note Transition Care Management Follow-up Telephone Call Date of discharge and from where:  Friday, 09/01/22; [place]; LLQ abdominal pain; surgical lysis of adhesions  How have you been since you were released from the hospital? "I am doing fine, just taking my time and making sure I am doing everything they told me to.  I am able to do everything on my own, but my wife is here if I were to need help.  I can't go over my medications today, I am still in bed, the phone woke me up.... I manage my own medications and they didn't make any changes, except for giving me some pain medication, so I am good on medicines, no questions or concerns.  My surgeon told me to come to see him in about 2 weeks and my appointment is scheduled." Any questions or concerns? No  Items Reviewed: Did the pt receive and understand the discharge instructions provided? Yes  Medications obtained and verified? Yes  patient declined full medication review, as above- states he just woke up; reports he self-manages medications and denies questions/ concerns; verified he obtained pain medications post-recent surgery Other? No  Any new allergies since your discharge? No  Dietary orders reviewed? No- reports essentially regular diet Do you have support at home? Yes  reports he is independent in self-care activities; wife assists as/ if needed  Home Care and Equipment/Supplies: Were home health services ordered? no If so, what is the name of the agency? N/A  Has the agency set up a time to come to the patient's home? not applicable Were any new equipment or medical supplies ordered?  No What is the name of the medical supply agency? N/A Were you able to get the supplies/equipment? not applicable Do you have any questions related to the use of the equipment or supplies? No N/A  Functional Questionnaire: (I = Independent and D = Dependent) ADLs: I  Bathing/Dressing- I  Meal Prep-  I  Eating- I  Maintaining continence- I  Transferring/Ambulation- I  Managing Meds- I  Follow up appointments reviewed:  PCP Hospital f/u appt confirmed? No  Scheduled to see - on - @ - verified post-op PCP office visit was not recommended at time of hospital discharge [facility] f/u appt confirmed? Yes  Scheduled to see neurosurgeon at [facility] on Friday, 09/22/22 @ 08:00 am- verified this is recommended time frame for post-op visit per review of outside hospital records Are transportation arrangements needed? No  If their condition worsens, is the pt aware to call PCP or go to the Emergency Dept.? Yes Was the patient provided with contact information for the PCP's office or ED? No- patient declined, reports already has contact information for all care providers Was to pt encouraged to call back with questions or concerns? Yes  SDOH assessments and interventions completed:   Yes SDOH Interventions Today    Flowsheet Row Most Recent Value  SDOH Interventions   Food Insecurity Interventions Intervention Not Indicated  Transportation Interventions Intervention Not Indicated  [patient normally drives self,  reports spouse will provide transportation while he is recuperating from surgery,  until cleared to resume driving]      Care Coordination Interventions:  Reviewed/ reinforced importance of walking and staying conservatively active post-op    Encounter Outcome:  Pt. Visit Completed    Oneta Rack, RN, BSN, CCRN Alumnus RN CM Care Coordination/ Transition of [place] Management [phone]: direct office

## 2022-09-05 NOTE — Patient Outreach (Signed)
  Care Coordination TOC Note Transition Care Management Unsuccessful Follow-up Telephone Call  Date of discharge and from where:  duplicate TOC entry- pls see entry dated 09/05/22  Attempts:  2nd Attempt  Reason for unsuccessful TCM follow-up call:  Unable to reach patient- duplicate entry for [facility]- see subsequent entry dated 09/05/22  Oneta Rack, RN, BSN, [place] RN CM Care Coordination/ Transition of [place] Management [phone]: direct office

## 2022-09-18 ENCOUNTER — Encounter: Payer: Self-pay | Admitting: Sports Medicine

## 2022-09-18 DIAGNOSIS — F5101 Primary insomnia: Secondary | ICD-10-CM

## 2022-09-18 MED ORDER — HYDROXYZINE HCL 50 MG PO TABS: 50.0000 mg | ORAL_TABLET | Freq: Every day | ORAL | 1 refills | Status: DC

## 2022-09-27 DIAGNOSIS — K581 Irritable bowel syndrome with constipation: Secondary | ICD-10-CM | POA: Diagnosis not present

## 2022-09-29 ENCOUNTER — Ambulatory Visit (INDEPENDENT_AMBULATORY_CARE_PROVIDER_SITE_OTHER): Payer: Medicare HMO | Admitting: Sports Medicine

## 2022-09-29 ENCOUNTER — Ambulatory Visit: Payer: Medicare HMO | Admitting: Sports Medicine

## 2022-09-29 ENCOUNTER — Encounter: Payer: Self-pay | Admitting: Sports Medicine

## 2022-09-29 VITALS — BP 128/85 | HR 82 | Wt 188.0 lb

## 2022-09-29 DIAGNOSIS — E119 Type 2 diabetes mellitus without complications: Secondary | ICD-10-CM | POA: Diagnosis not present

## 2022-09-29 DIAGNOSIS — K56609 Unspecified intestinal obstruction, unspecified as to partial versus complete obstruction: Secondary | ICD-10-CM

## 2022-09-29 NOTE — Progress Notes (Signed)
    Procedures performed today:    None.  Independent interpretation of notes and tests performed by another provider:   None.  Brief History, Exam, Impression, and Recommendations:    Small bowel obstruction ([place]) This pleasant 72 year old male returns, status post Roux-en-Y gastric bypass, appendectomy, 2 months ago he was hospitalized with obstipation, found to have small bowel obstruction with a transition point right lower quadrant likely due to adhesions from prior surgery, he did well in the postoperative period, ultimately he was seen for general surgery follow-up, had a bowel resection and is doing well now.  Diabetes mellitus type 2, controlled Well-controlled, he does need a urine microalbumin.    ____________________________________________ [name]. Dianah Field, M.D., ABFM., CAQSM., AME. Primary Care and Sports Medicine [place] [facility]  Adjunct Professor of [place] [employer] [facility] of Medicine  Risk manager

## 2022-09-29 NOTE — Assessment & Plan Note (Signed)
This pleasant 72 year old male returns, status post Roux-en-Y gastric bypass, appendectomy, 2 months ago he was hospitalized with obstipation, found to have small bowel obstruction with a transition point right lower quadrant likely due to adhesions from prior surgery, he did well in the postoperative period, ultimately he was seen for general surgery follow-up, had a bowel resection and is doing well now.

## 2022-09-29 NOTE — Assessment & Plan Note (Signed)
Well-controlled, he does need a urine microalbumin.

## 2022-10-23 DIAGNOSIS — I10 Essential (primary) hypertension: Secondary | ICD-10-CM | POA: Diagnosis not present

## 2022-10-23 DIAGNOSIS — N1832 Chronic kidney disease, stage 3b: Secondary | ICD-10-CM | POA: Diagnosis not present

## 2022-10-23 DIAGNOSIS — E1122 Type 2 diabetes mellitus with diabetic chronic kidney disease: Secondary | ICD-10-CM | POA: Diagnosis not present

## 2022-10-23 DIAGNOSIS — D472 Monoclonal gammopathy: Secondary | ICD-10-CM | POA: Diagnosis not present

## 2022-10-23 DIAGNOSIS — D631 Anemia in chronic kidney disease: Secondary | ICD-10-CM | POA: Diagnosis not present

## 2022-10-29 ENCOUNTER — Encounter: Payer: Self-pay | Admitting: Sports Medicine

## 2022-10-30 NOTE — Telephone Encounter (Signed)
Some of my partners have openings this week, lets get him in with one of them please.

## 2022-10-31 ENCOUNTER — Ambulatory Visit: Payer: Managed Care, Other (non HMO) | Admitting: Family Medicine

## 2022-10-31 ENCOUNTER — Encounter: Payer: Self-pay | Admitting: Sports Medicine

## 2022-10-31 ENCOUNTER — Ambulatory Visit (INDEPENDENT_AMBULATORY_CARE_PROVIDER_SITE_OTHER): Payer: Medicare HMO | Admitting: Sports Medicine

## 2022-10-31 VITALS — BP 124/77 | HR 86 | Wt 195.0 lb

## 2022-10-31 DIAGNOSIS — E538 Deficiency of other specified B group vitamins: Secondary | ICD-10-CM | POA: Diagnosis not present

## 2022-10-31 DIAGNOSIS — E119 Type 2 diabetes mellitus without complications: Secondary | ICD-10-CM | POA: Diagnosis not present

## 2022-10-31 DIAGNOSIS — H814 Vertigo of central origin: Secondary | ICD-10-CM

## 2022-10-31 DIAGNOSIS — N401 Enlarged prostate with lower urinary tract symptoms: Secondary | ICD-10-CM

## 2022-10-31 DIAGNOSIS — R42 Dizziness and giddiness: Secondary | ICD-10-CM

## 2022-10-31 NOTE — Assessment & Plan Note (Signed)
James Frye returns, he has had additional episodes of vertigo that he describes as significant spinning, he feels hot and has to lay down. They are not specifically linked with change in head position, he has had vestibular rehab in the past. He denies any preceding or postceding headaches. No other focal neurologic symptoms. He had a nuclear stress test in 2021 that was clear. He will continue the Epley maneuver for now, but considering the lack of association with head turning I would like a brain MRI with and without contrast to ensure that were not dealing central vertigo with a cerebellar or brainstem TIA.

## 2022-10-31 NOTE — Progress Notes (Signed)
    Procedures performed today:    None.  Independent interpretation of notes and tests performed by another provider:   None.  Brief History, Exam, Impression, and Recommendations:    Vertigo James Frye returns, he has had additional episodes of vertigo that he describes as significant spinning, he feels hot and has to lay down. They are not specifically linked with change in head position, he has had vestibular rehab in the past. He denies any preceding or postceding headaches. No other focal neurologic symptoms. He had a nuclear stress test in 2021 that was clear. He will continue the Epley maneuver for now, but considering the lack of association with head turning I would like a brain MRI with and without contrast to ensure that were not dealing central vertigo with a cerebellar or brainstem TIA.  I spent 30 minutes of total time managing this patient today, this includes chart review, face to face, and non-face to face time.  ____________________________________________ [name]. Dianah Field, M.D., ABFM., CAQSM., AME. Primary Care and Sports Medicine [place] [facility]  Adjunct Professor of [place] of [employer] [facility] of Medicine  Risk manager

## 2022-11-02 LAB — COMPREHENSIVE METABOLIC PANEL
AG Ratio: 1.4 (calc) (ref 1.0–2.5)
ALT: 20 U/L (ref 9–46)
AST: 15 U/L (ref 10–35)
Albumin: 4.3 g/dL (ref 3.6–5.1)
Alkaline phosphatase (APISO): 78 U/L (ref 35–144)
BUN/Creatinine Ratio: 14 (calc) (ref 6–22)
BUN: 29 mg/dL — ABNORMAL HIGH (ref 7–25)
CO2: 26 mmol/L (ref 20–32)
Calcium: 9.1 mg/dL (ref 8.6–10.3)
Chloride: 108 mmol/L (ref 98–110)
Creat: 2.05 mg/dL — ABNORMAL HIGH (ref 0.70–1.28)
Globulin: 3.1 g/dL (calc) (ref 1.9–3.7)
Glucose, Bld: 120 mg/dL — ABNORMAL HIGH (ref 65–99)
Potassium: 4.6 mmol/L (ref 3.5–5.3)
Sodium: 142 mmol/L (ref 135–146)
Total Bilirubin: 0.5 mg/dL (ref 0.2–1.2)
Total Protein: 7.4 g/dL (ref 6.1–8.1)

## 2022-11-02 LAB — LIPID PANEL
Cholesterol: 166 mg/dL (ref <200)
HDL: 116 mg/dL (ref >=40)
LDL Cholesterol (Calc): 36 mg/dL (calc)
Non-HDL Cholesterol (Calc): 50 mg/dL (calc) (ref <130)
Total CHOL/HDL Ratio: 1.4 (calc) (ref <5.0)
Triglycerides: 68 mg/dL (ref <150)

## 2022-11-02 LAB — PSA, TOTAL AND FREE
PSA, % Free: 35 % (calc) (ref >25)
PSA, Free: 1.2 ng/mL
PSA, Total: 3.4 ng/mL (ref <=4.0)

## 2022-11-02 LAB — TSH: TSH: 0.76 mIU/L (ref 0.40–4.50)

## 2022-11-02 LAB — CBC
HCT: 34.8 % — ABNORMAL LOW (ref 38.5–50.0)
Hemoglobin: 11 g/dL — ABNORMAL LOW (ref 13.2–17.1)
MCH: 27 pg (ref 27.0–33.0)
MCHC: 31.6 g/dL — ABNORMAL LOW (ref 32.0–36.0)
MCV: 85.5 fL (ref 80.0–100.0)
MPV: 10.2 fL (ref 7.5–12.5)
Platelets: 212 10*3/uL (ref 140–400)
RBC: 4.07 10*6/uL — ABNORMAL LOW (ref 4.20–5.80)
RDW: 14.5 % (ref 11.0–15.0)
WBC: 4.9 10*3/uL (ref 3.8–10.8)

## 2022-11-02 LAB — MICROALBUMIN / CREATININE URINE RATIO
Creatinine, Urine: 96 mg/dL (ref 20–320)
Microalb Creat Ratio: 9 mg/g creat (ref <30)
Microalb, Ur: 0.9 mg/dL

## 2022-11-02 LAB — HEMOGLOBIN A1C
Hgb A1c MFr Bld: 7 % of total Hgb — ABNORMAL HIGH (ref <5.7)
Mean Plasma Glucose: 154 mg/dL
eAG (mmol/L): 8.5 mmol/L

## 2022-11-02 LAB — VITAMIN B12: Vitamin B-12: 697 pg/mL (ref 200–1100)

## 2022-11-06 ENCOUNTER — Ambulatory Visit: Payer: Managed Care, Other (non HMO) | Admitting: Sports Medicine

## 2022-11-08 ENCOUNTER — Other Ambulatory Visit: Payer: Self-pay | Admitting: Sports Medicine

## 2022-11-08 DIAGNOSIS — R42 Dizziness and giddiness: Secondary | ICD-10-CM

## 2022-11-13 ENCOUNTER — Ambulatory Visit (INDEPENDENT_AMBULATORY_CARE_PROVIDER_SITE_OTHER): Payer: Medicare HMO

## 2022-11-13 DIAGNOSIS — R42 Dizziness and giddiness: Secondary | ICD-10-CM

## 2022-11-13 DIAGNOSIS — H814 Vertigo of central origin: Secondary | ICD-10-CM | POA: Diagnosis not present

## 2022-11-13 DIAGNOSIS — R29818 Other symptoms and signs involving the nervous system: Secondary | ICD-10-CM | POA: Diagnosis not present

## 2022-11-13 MED ORDER — GADOBUTROL 1 MMOL/ML IV SOLN
9.0000 mL | Freq: Once | INTRAVENOUS | Status: AC | PRN
  Administered 2022-11-13: 9 mL via INTRAVENOUS

## 2022-11-16 ENCOUNTER — Encounter: Payer: Self-pay | Admitting: Sports Medicine

## 2022-11-20 ENCOUNTER — Other Ambulatory Visit: Payer: Self-pay | Admitting: Pharmacist

## 2022-11-20 NOTE — Progress Notes (Signed)
11/20/2022 Name: James Frye MRN: [id number] DOB: 1951/02/26   James Frye is a 72 y.o. year old male who presented for a telephone visit.   They were referred to the pharmacist by a quality report for assistance in managing  quality metrics: med adherence hypertension [facility]), med adherence cholesterol (MAC) .    Subjective:  Care Team: Primary Care Provider: [name], MD ;    Medication Access/Adherence  Current Pharmacy:  [facility] [phone] - [place], [place] - [address] AT [facility] OF [address] [place] [address] Phone: [phone] Fax: [phone]   Patient reports affordability concerns with their medications: No  Patient reports access/transportation concerns to their pharmacy: No  Patient reports adherence concerns with their medications:  No     Hypertension:  Current medications: losartan 25mg  daily Medications previously tried: lisinopril 2.5mg  daily   Hyperlipidemia/ASCVD Risk Reduction  Current lipid lowering medications: rosuvastatin 10mg  daily   Objective:  Lab Results  Component Value Date   HGBA1C 7.0 (H) 10/31/2022    Lab Results  Component Value Date   CREATININE 2.05 (H) 10/31/2022   BUN 29 (H) 10/31/2022   NA 142 10/31/2022   K 4.6 10/31/2022   CL 108 10/31/2022   CO2 26 10/31/2022    Lab Results  Component Value Date   CHOL 166 10/31/2022   HDL 116 10/31/2022   LDLCALC 36 10/31/2022   TRIG 68 10/31/2022   CHOLHDL 1.4 10/31/2022    Medications Reviewed Today     Reviewed by Carren Rang, CMA (Certified Medical Assistant) on 10/31/22 at 1017  Med List Status: <None>   Medication Order Taking? Sig Documenting Provider Last Dose Status Informant  busPIRone (BUSPAR) 5 MG tablet [id number] Yes TAKE 1 TO 2 TABLETS(5 TO 10 MG) BY MOUTH TWICE DAILY [name], MD Taking Active   calcium carbonate (OS-CAL) 1250 (500 Ca) MG chewable tablet [id number] Yes  Chew by mouth 2 (two) times daily with a meal. [provider] Taking Active   Continuous Blood Gluc Sensor (FREESTYLE LIBRE 3 SENSOR) MISC [id number] Yes 1 each by Does not apply route in the morning, at noon, and at bedtime. Place 1 sensor on the skin every 14 days. Use to check glucose continuously. [name], MD Taking Active   famotidine (PEPCID) 20 MG tablet [id number] Yes Take 20 mg by mouth. [provider] Taking Active   fluticasone (FLONASE) 50 MCG/ACT nasal spray [id number] Yes Place 2 sprays into both nostrils daily. [provider] Taking Active   hydrOXYzine (ATARAX) 50 MG tablet [id number] Yes Take 1 tablet (50 mg total) by mouth at bedtime. [name], MD Taking Active   ipratropium (ATROVENT) 0.06 % nasal spray [id number] Yes Place 2 sprays into both nostrils 3 (three) times daily. [name], MD Taking Active   JARDIANCE 10 MG TABS tablet [id number] Yes Take 10 mg by mouth daily. [provider] Taking Active   losartan (COZAAR) 25 MG tablet [id number] Yes Take by mouth. [provider] Taking Active   omeprazole (PRILOSEC) 40 MG capsule [id number] Yes Take 40 mg by mouth at bedtime. [provider] Taking Active   primidone (MYSOLINE) 50 MG tablet [id number] Yes Take 1 tablet (50 mg total) by mouth at bedtime. [name], MD Taking Active   rosuvastatin (CRESTOR) 20 MG tablet [id number] Yes Take 1 tablet (20 mg total) by mouth daily.  Patient taking differently:  Take 10 mg by mouth daily.   [name], MD Taking Active   tamsulosin [facility]) 0.4 MG CAPS capsule [id number] Yes Take 1 capsule (0.4 mg total) by mouth daily after breakfast. [name], MD Taking Active               Assessment/Plan:   Hypertension: - Currently controlled, reviewed fill history of medications as well as confirmed medication adherence with patient - Recommend to continue current  regimen      Hyperlipidemia/ASCVD Risk Reduction: - Currently controlled. Reviewed fill history of medications as well as confirmed medication adherence with patient - duplicate rosuvastatin RX identified from PCP and Dr. Haroldine Laws, nephrology. Patient is taking rosuvastatin 10mg  daily from Dr. Allayne Stack prescription. - Resolved duplicate order, recommend to continue current regimen    Lynnda Shields, PharmD, BCPS Clinical Pharmacist [facility]

## 2022-11-20 NOTE — Progress Notes (Signed)
Patient appearing on report for quality metrics.  Outreached patient to discuss medication management as well as offer support. Due to poor connection with patient's phone, was unable to leave voicemail for patient to return my call at their convenience. Will reattempt.  Lynnda Shields, PharmD, BCPS Clinical Pharmacist [facility]

## 2022-12-05 ENCOUNTER — Encounter: Payer: Self-pay | Admitting: Sports Medicine

## 2022-12-06 ENCOUNTER — Encounter: Payer: Self-pay | Admitting: Sports Medicine

## 2022-12-07 NOTE — Telephone Encounter (Addendum)
We have the disability paperwork, James Frye needs an appointment to to go over this and help me fill it out.

## 2022-12-08 ENCOUNTER — Encounter: Payer: Self-pay | Admitting: Sports Medicine

## 2022-12-08 ENCOUNTER — Ambulatory Visit (INDEPENDENT_AMBULATORY_CARE_PROVIDER_SITE_OTHER): Payer: Medicare HMO | Admitting: Sports Medicine

## 2022-12-08 VITALS — BP 122/70 | HR 90

## 2022-12-08 DIAGNOSIS — R29818 Other symptoms and signs involving the nervous system: Secondary | ICD-10-CM

## 2022-12-08 DIAGNOSIS — E119 Type 2 diabetes mellitus without complications: Secondary | ICD-10-CM | POA: Diagnosis not present

## 2022-12-08 DIAGNOSIS — N401 Enlarged prostate with lower urinary tract symptoms: Secondary | ICD-10-CM | POA: Diagnosis not present

## 2022-12-08 LAB — POCT URINALYSIS DIP (CLINITEK)
Bilirubin, UA: NEGATIVE
Blood, UA: NEGATIVE
Glucose, UA: >=1000 mg/dL — AB
Ketones, POC UA: NEGATIVE mg/dL
Leukocytes, UA: NEGATIVE
Nitrite, UA: NEGATIVE
Spec Grav, UA: >=1.03 — AB (ref 1.010–1.025)
Urobilinogen, UA: 0.2 E.U./dL
pH, UA: 5.5 (ref 5.0–8.0)

## 2022-12-08 NOTE — Progress Notes (Addendum)
    Procedures performed today:    None.  Independent interpretation of notes and tests performed by another provider:   None.  Brief History, Exam, Impression, and Recommendations:    Disability due to neurological disorder James Frye has a history of a stroke, he is having intermittent episodes loss of consciousness, the visit today was predominantly to fill out disability paperwork.  Benign prostate hyperplasia History of episodes of urinary frequency, urgency, nocturia, hesitancy, history of prostatitis treated with 14 days of Cipro, Flomax, he improved, unfortunately he is having recurrence of symptoms so we will get a UA and urine culture today. If concerning we will add Cipro again. He does need to touch base again with his urologist if symptoms persist.  Update: Glycosuria on urinalysis, we will go ahead and check some labs including blood sugar and A1c.  Diabetes mellitus type 2, controlled Historically controlled diabetes, increasing polyuria today, glycosuria on urinalysis, adding some labs. He is on [place].  I spent 40 minutes of total time managing this patient today, this includes chart review, face to face, and non-face to face time.  ____________________________________________ [name]. Benjamin Stain, M.D., ABFM., CAQSM., AME. Primary Care and Sports Medicine [place] [facility]  Adjunct Professor of Family Medicine  [place] [employer]  Restaurant manager, fast food

## 2022-12-08 NOTE — Addendum Note (Signed)
Addended by: Carren Rang A on: 12/08/2022 09:46 AM   Modules accepted: Orders

## 2022-12-08 NOTE — Assessment & Plan Note (Signed)
James Frye has a history of a stroke, he is having intermittent episodes loss of consciousness, the visit today was predominantly to fill out disability paperwork.

## 2022-12-08 NOTE — Assessment & Plan Note (Signed)
Historically controlled diabetes, increasing polyuria today, glycosuria on urinalysis, adding some labs. He is on [place].

## 2022-12-08 NOTE — Addendum Note (Signed)
Addended by: [name] on: 12/08/2022 09:33 AM   Modules accepted: Orders, Level of Service

## 2022-12-08 NOTE — Telephone Encounter (Signed)
Patient scheduled.

## 2022-12-08 NOTE — Addendum Note (Signed)
Addended by: Holland Falling on: 12/08/2022 09:35 AM   Modules accepted: Orders

## 2022-12-08 NOTE — Assessment & Plan Note (Addendum)
History of episodes of urinary frequency, urgency, nocturia, hesitancy, history of prostatitis treated with 14 days of Cipro, Flomax, he improved, unfortunately he is having recurrence of symptoms so we will get a UA and urine culture today. If concerning we will add Cipro again. He does need to touch base again with his urologist if symptoms persist.  Update: Glycosuria on urinalysis, we will go ahead and check some labs including blood sugar and A1c.

## 2022-12-09 LAB — CBC
HCT: 33.3 % — ABNORMAL LOW (ref 38.5–50.0)
Hemoglobin: 10.5 g/dL — ABNORMAL LOW (ref 13.2–17.1)
MCH: 26.4 pg — ABNORMAL LOW (ref 27.0–33.0)
MCHC: 31.5 g/dL — ABNORMAL LOW (ref 32.0–36.0)
MCV: 83.9 fL (ref 80.0–100.0)
MPV: 10.5 fL (ref 7.5–12.5)
Platelets: 242 10*3/uL (ref 140–400)
RBC: 3.97 10*6/uL — ABNORMAL LOW (ref 4.20–5.80)
RDW: 14.9 % (ref 11.0–15.0)
WBC: 5.4 10*3/uL (ref 3.8–10.8)

## 2022-12-09 LAB — COMPREHENSIVE METABOLIC PANEL
AG Ratio: 1.5 (calc) (ref 1.0–2.5)
ALT: 15 U/L (ref 9–46)
AST: 13 U/L (ref 10–35)
Albumin: 4.1 g/dL (ref 3.6–5.1)
Alkaline phosphatase (APISO): 77 U/L (ref 35–144)
BUN/Creatinine Ratio: 12 (calc) (ref 6–22)
BUN: 26 mg/dL — ABNORMAL HIGH (ref 7–25)
CO2: 27 mmol/L (ref 20–32)
Calcium: 9.3 mg/dL (ref 8.6–10.3)
Chloride: 105 mmol/L (ref 98–110)
Creat: 2.15 mg/dL — ABNORMAL HIGH (ref 0.70–1.28)
Globulin: 2.8 g/dL (calc) (ref 1.9–3.7)
Glucose, Bld: 128 mg/dL — ABNORMAL HIGH (ref 65–99)
Potassium: 4.4 mmol/L (ref 3.5–5.3)
Sodium: 139 mmol/L (ref 135–146)
Total Bilirubin: 0.6 mg/dL (ref 0.2–1.2)
Total Protein: 6.9 g/dL (ref 6.1–8.1)

## 2022-12-09 LAB — URINE CULTURE
MICRO NUMBER:: 14879510
Result:: NO GROWTH
SPECIMEN QUALITY:: ADEQUATE

## 2022-12-09 LAB — HEMOGLOBIN A1C
Hgb A1c MFr Bld: 7.2 % of total Hgb — ABNORMAL HIGH (ref <5.7)
Mean Plasma Glucose: 160 mg/dL
eAG (mmol/L): 8.9 mmol/L

## 2022-12-10 ENCOUNTER — Encounter: Payer: Self-pay | Admitting: Sports Medicine

## 2022-12-10 DIAGNOSIS — E119 Type 2 diabetes mellitus without complications: Secondary | ICD-10-CM

## 2022-12-12 MED ORDER — SITAGLIPTIN PHOSPHATE 50 MG PO TABS
50.0000 mg | ORAL_TABLET | Freq: Every day | ORAL | 3 refills | Status: DC
Start: 2022-12-12 — End: 2023-04-06

## 2022-12-28 ENCOUNTER — Ambulatory Visit (INDEPENDENT_AMBULATORY_CARE_PROVIDER_SITE_OTHER): Payer: Medicare HMO | Admitting: Sports Medicine

## 2022-12-28 ENCOUNTER — Encounter: Payer: Self-pay | Admitting: Sports Medicine

## 2022-12-28 VITALS — BP 126/78 | HR 105 | Ht 67.0 in | Wt 194.0 lb

## 2022-12-28 DIAGNOSIS — N401 Enlarged prostate with lower urinary tract symptoms: Secondary | ICD-10-CM

## 2022-12-28 DIAGNOSIS — G250 Essential tremor: Secondary | ICD-10-CM | POA: Diagnosis not present

## 2022-12-28 DIAGNOSIS — R42 Dizziness and giddiness: Secondary | ICD-10-CM

## 2022-12-28 MED ORDER — PRIMIDONE 50 MG PO TABS
ORAL_TABLET | ORAL | 3 refills | Status: DC
Start: 2022-12-28 — End: 2023-04-02

## 2022-12-28 NOTE — Assessment & Plan Note (Signed)
Benign essential tremor, improved considerably, he reports increasing tremor so we will go ahead and go up on his primidone to 2 tabs at bedtime.

## 2022-12-28 NOTE — Progress Notes (Signed)
    Procedures performed today:    None.  Independent interpretation of notes and tests performed by another provider:   None.  Brief History, Exam, Impression, and Recommendations:    Benign prostate hyperplasia Michele Mcalpine does have BPH, he is having significant orthostasis, I will have him cut back his Flomax to every other day.  Benign essential tremor Benign essential tremor, improved considerably, he reports increasing tremor so we will go ahead and go up on his primidone to 2 tabs at bedtime.    ____________________________________________ [name]. Benjamin Stain, M.D., ABFM., CAQSM., AME. Primary Care and Sports Medicine [place] [facility]  Adjunct Professor of Family Medicine  [place] [employer] [facility] [employer] [facility] [employer]  Restaurant manager, fast food

## 2022-12-28 NOTE — Assessment & Plan Note (Signed)
James Frye does have BPH, he is having significant orthostasis, I will have him cut back his Flomax to every other day.

## 2023-01-05 ENCOUNTER — Ambulatory Visit: Payer: Medicare HMO | Attending: Sports Medicine | Admitting: Physical Therapy

## 2023-01-05 ENCOUNTER — Encounter: Payer: Self-pay | Admitting: Physical Therapy

## 2023-01-05 ENCOUNTER — Other Ambulatory Visit: Payer: Self-pay

## 2023-01-05 DIAGNOSIS — R42 Dizziness and giddiness: Secondary | ICD-10-CM | POA: Insufficient documentation

## 2023-01-05 NOTE — Therapy (Signed)
OUTPATIENT PHYSICAL THERAPY VESTIBULAR EVALUATION     Patient Name: James Frye MRN: [id number] DOB:01-11-1951, 72 y.o., male Today's Date: 01/05/2023  END OF SESSION:  PT End of Session - 01/05/23 0906     Visit Number 1    Number of Visits 12    Date for PT Re-Evaluation 02/16/23    Authorization Type [employer] medicare    Authorization - Visit Number 1    Progress Note Due on Visit 10    PT Start Time 0800    PT Stop Time 0845    PT Time Calculation (min) 45 min    Activity Tolerance Patient tolerated treatment well    Behavior During Therapy [facility] for tasks assessed/performed             Past Medical History:  Diagnosis Date   Allergy not known, long ago   Anemia of chronic renal failure, stage 3b (HCC) 08/10/2022   CAD (coronary artery disease)    Diabetes mellitus    Hiatal hernia    Hypertension    Past Surgical History:  Procedure Laterality Date   APPENDECTOMY     CARDIAC CATHETERIZATION     GASTRIC BYPASS     Patient Active Problem List   Diagnosis Date Noted   Disability due to neurological disorder 12/08/2022   Small bowel obstruction (HCC) 08/18/2022   Strain of left pectoralis muscle 07/11/2022   Vitreous floater, bilateral 03/29/2022   Impingement syndrome, shoulder, right 02/15/2022   Chronic renal insufficiency, stage III (moderate) (HCC) 10/06/2021   Benign essential tremor 01/20/2021   Obstructive sleep apnea 10/22/2020   Generalized anxiety disorder 10/22/2020   Seborrheic keratosis 04/25/2019   Dupuytrens contracture 10/22/2018   Normocytic anemia 05/04/2018   Vertigo 05/02/2018   Right cervical radiculopathy 06/27/2016   Benign prostate hyperplasia 12/29/2015   Chronic cough 11/23/2015   CAD (coronary artery disease) 07/22/2014   Annual physical exam 11/07/2012   Insomnia 11/07/2012   Perennial allergic rhinitis 11/07/2012   H/O gastric bypass 10/09/2012   Erectile dysfunction associated with type 2 diabetes mellitus (HCC)  11/14/2011   S/P stroke due to cerebrovascular disease 11/14/2011   Diabetes mellitus type 2, controlled (HCC) 11/14/2011    PCP: Benjamin Stain REFERRING PROVIDER: Benjamin Stain  REFERRING DIAG: Vertigo  THERAPY DIAG:  Dizziness and giddiness  ONSET DATE: 12/2022  Rationale for Evaluation and Treatment: Rehabilitation  SUBJECTIVE:   SUBJECTIVE STATEMENT: Pt states he has "fainting spells" and was having them 1-2x a week. He states when the spells occur he gets hot and the "world starts spinning" and he looses his balance. 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 He states he does not have any cardiac history and that his diabetes is under control. Since he has been out of work he has not had any episodes of dizziness. Pt does report occasional headaches and states he has noticed some dizziness with watching TV or scrolling phone. Pt accompanied by: self  PERTINENT HISTORY: history of vertigo 2020, CVA 2011  PAIN:  Are you having pain? No  PRECAUTIONS: None  WEIGHT BEARING RESTRICTIONS: No  FALLS: Has patient fallen in last 6 months?  No, near falls due to dizzy spells   PLOF: Independent  PATIENT GOALS: decrease episodes of dizziness  OBJECTIVE:   DIAGNOSTIC FINDINGS: MRI brain: 1. No acute intracranial abnormality. 2. Normal MRA of the head and neck. 3. Mild chronic small vessel disease.  COGNITION: Overall cognitive status: Within functional limits for tasks assessed   POSTURE:  rounded shoulders and forward head  Cervical ROM:   Some increased dizziness with cervical rotation, increased mm spasticity cervical paraspinals    VESTIBULAR ASSESSMENT:     SYMPTOM BEHAVIOR:  Subjective history: see above  Non-Vestibular symptoms: headaches  Type  of dizziness: Spinning/Vertigo  Frequency: 1-2x a week  Duration: 30 minutes  Aggravating factors: No known aggravating factors  Relieving factors: rest  Progression of symptoms: better  OCULOMOTOR EXAM:  Ocular Alignment: normal  Ocular ROM: No Limitations  Spontaneous Nystagmus: absent  Gaze-Induced Nystagmus: absent  Smooth Pursuits: intact some dizziness  Saccades: intact  Convergence: moderate dizziness  VESTIBULAR - OCULAR REFLEX:   Slow VOR: Normal  VOR Cancellation: Unable to Maintain Gaze  Head-Impulse Test: HIT Right: negative HIT Left: negative     POSITIONAL TESTING: Right Dix-Hallpike: no nystagmus Left Dix-Hallpike: no nystagmus Right Roll Test: no nystagmus Left Roll Test: no nystagmus  MOTION SENSITIVITY:  Motion Sensitivity Quotient Intensity: 0 = none, 1 = Lightheaded, 2 = Mild, 3 = Moderate, 4 = Severe, 5 = Vomiting  Intensity  1. Sitting to supine   2. Supine to L side   3. Supine to R side   4. Supine to sitting   5. L Hallpike-Dix   6. Up from L    7. R Hallpike-Dix   8. Up from R    9. Sitting, head tipped to L knee   10. Head up from L knee   11. Sitting, head tipped to R knee   12. Head up from R knee   13. Sitting head turns x5   14.Sitting head nods x5   15. In stance, 180 turn to L    16. In stance, 180 turn to R     OTHOSTATICS: supine 110/64, seated 112/66, standing 111/69    VESTIBULAR TREATMENT:                                                                                                   DATE: 01/05/23 See HEP  PATIENT EDUCATION: Education details: PT POC and goals, HEP Person educated: Patient Education method: Explanation, Demonstration, and Handouts Education comprehension: verbalized understanding and returned demonstration  HOME EXERCISE PROGRAM: Access Code: [email or web address] URL: [email or web address] Date: 01/05/2023 Prepared by: Reggy Eye  Exercises - Seated Horizontal Smooth Pursuit  - 1 x  daily - 7 x weekly - 3 sets - 10 reps - Seated Vertical Smooth Pursuit  - 1 x daily - 7 x weekly - 3 sets - 10 reps - Seated Proximal-Distal Smooth Pursuit  - 1 x daily - 7 x weekly - 3 sets - 10 reps  GOALS: Goals reviewed with patient? Yes  SHORT TERM GOALS: Target date: 01/19/2023    Pt will be independent with initial HEP Baseline: Goal status: INITIAL  LONG TERM GOALS: Target date: 02/16/2023    Pt  will reduce "dizzy spells" to <= 2x per month Baseline:  Goal status: INITIAL  2.  Pt will return to full work duties with no symptoms Baseline:  Goal status: INITIAL  3.  Pt will report he is able to watch TV x 1 hour with no symptoms Baseline:  Goal status: INITIAL  4.  Pt will be independent with advanced HEP Baseline:  Goal status: INITIAL   ASSESSMENT:  CLINICAL IMPRESSION: Patient is a 72 y.o. male who was seen today for physical therapy evaluation and treatment for dizziness. Pt presents with complaints of "dizzy episodes" 1-2x a week. He presents with difficulty with smooth pursuits and convergence, no symptoms with positional testing. Pt will benefit from skilled PT to address deficits and improve functional activity tolerance for a full return to work.   OBJECTIVE IMPAIRMENTS: decreased activity tolerance, decreased balance, and dizziness.   ACTIVITY LIMITATIONS:  working  PARTICIPATION LIMITATIONS: driving and occupation  PERSONAL FACTORS: Past/current experiences and Time since onset of injury/illness/exacerbation are also affecting patient's functional outcome.   REHAB POTENTIAL: Good  CLINICAL DECISION MAKING: Evolving/moderate complexity  EVALUATION COMPLEXITY: Moderate   PLAN:  PT FREQUENCY: 1-2x/week  PT DURATION: 6 weeks  PLANNED INTERVENTIONS: Therapeutic exercises, Therapeutic activity, Neuromuscular re-education, Balance training, Gait training, Patient/Family education, Self Care, Joint mobilization, Vestibular training, Canalith  repositioning, Dry Needling, Electrical stimulation, Cryotherapy, Moist heat, Taping, Traction, Manual therapy, and Re-evaluation  PLAN FOR NEXT SESSION: assess response to HEP, continue vestibular treatment   Clorene Nerio, PT 01/05/2023, 9:08 AM

## 2023-01-09 ENCOUNTER — Encounter: Payer: Self-pay | Admitting: Sports Medicine

## 2023-01-10 ENCOUNTER — Encounter: Payer: Self-pay | Admitting: Physical Therapy

## 2023-01-10 ENCOUNTER — Encounter: Payer: Self-pay | Admitting: Sports Medicine

## 2023-01-10 ENCOUNTER — Ambulatory Visit: Payer: Medicare HMO | Admitting: Physical Therapy

## 2023-01-10 DIAGNOSIS — R42 Dizziness and giddiness: Secondary | ICD-10-CM

## 2023-01-10 NOTE — Therapy (Signed)
OUTPATIENT PHYSICAL THERAPY VESTIBULAR TREATMENT     Patient Name: James Frye MRN: [id number] DOB:[birth year], 72 y.o., male Today's Date: 01/10/2023  END OF SESSION:  PT End of Session - 01/10/23 1403     Visit Number 2    Number of Visits 12    Date for PT Re-Evaluation 02/16/23    Authorization Type [employer]are    Progress Note Due on Visit 10    PT Start Time 1403    PT Stop Time 1445    PT Time Calculation (min) 42 min    Activity Tolerance Patient tolerated treatment well    Behavior During Therapy WFL for tasks assessed/performed             Past Medical History:  Diagnosis Date   Allergy not known, long ago   Anemia of chronic renal failure, stage 3b (HCC) 08/10/2022   CAD (coronary artery disease)    Diabetes mellitus    Hiatal hernia    Hypertension    Past Surgical History:  Procedure Laterality Date   APPENDECTOMY     CARDIAC CATHETERIZATION     GASTRIC BYPASS     Patient Active Problem List   Diagnosis Date Noted   Disability due to neurological disorder 12/08/2022   Small bowel obstruction (HCC) 08/18/2022   Strain of left pectoralis muscle 07/11/2022   Vitreous floater, bilateral 03/29/2022   Impingement syndrome, shoulder, right 02/15/2022   Chronic renal insufficiency, stage III (moderate) (HCC) 10/06/2021   Benign essential tremor 01/20/2021   Obstructive sleep apnea 10/22/2020   Generalized anxiety disorder 10/22/2020   Seborrheic keratosis 04/25/2019   Dupuytrens contracture 10/22/2018   Normocytic anemia 05/04/2018   Vertigo 05/02/2018   Right cervical radiculopathy 06/27/2016   Benign prostate hyperplasia 12/29/2015   Chronic cough 11/23/2015   CAD (coronary artery disease) 07/22/2014   Annual physical exam 11/07/2012   Insomnia 11/07/2012   Perennial allergic rhinitis 11/07/2012   H/O gastric bypass 10/09/2012   Erectile dysfunction associated with type 2 diabetes mellitus (HCC) 11/14/2011   S/P stroke due to  cerebrovascular disease 11/14/2011   Diabetes mellitus type 2, controlled (HCC) 11/14/2011    PCP: Benjamin Stain REFERRING PROVIDER: Benjamin Stain  REFERRING DIAG: Vertigo  THERAPY DIAG:  Dizziness and giddiness  ONSET DATE: 12/2022  Rationale for Evaluation and Treatment: Rehabilitation  SUBJECTIVE:   SUBJECTIVE STATEMENT: Pt reports no fainting spells. Pt states he has been doing his exercises and has been feeling less dizziness with them. Pt does note increased dizziness this past week while in the grocery.  Pt accompanied by: self  PERTINENT HISTORY: history of vertigo 2020, CVA 2011  PAIN:  Are you having pain? No  PRECAUTIONS: None  WEIGHT BEARING RESTRICTIONS: No  FALLS: Has patient fallen in last 6 months?  No, near falls due to dizzy spells   PLOF: Independent  PATIENT GOALS: decrease episodes of dizziness  OBJECTIVE:   DIAGNOSTIC FINDINGS: MRI brain: 1. No acute intracranial abnormality. 2. Normal MRA of the head and neck. 3. Mild chronic small vessel disease.  COGNITION: Overall cognitive status: Within functional limits for tasks assessed  POSTURE:  rounded shoulders and forward head  Cervical ROM:   Some increased dizziness with cervical rotation, increased mm spasticity cervical paraspinals    VESTIBULAR ASSESSMENT:     SYMPTOM BEHAVIOR:  Subjective history: see above  Non-Vestibular symptoms: headaches  Type of dizziness: Spinning/Vertigo  Frequency: 1-2x a week  Duration: 30 minutes  Aggravating factors: No known aggravating factors  Relieving factors: rest  Progression of symptoms: better  OCULOMOTOR EXAM:  Ocular Alignment: normal  Ocular ROM: No Limitations  Spontaneous Nystagmus: absent  Gaze-Induced Nystagmus: absent  Smooth Pursuits: intact some dizziness  Saccades: intact  Convergence: moderate dizziness  VESTIBULAR - OCULAR REFLEX:   Slow VOR: Normal  VOR Cancellation: Unable to Maintain Gaze  Head-Impulse Test: HIT  Right: negative HIT Left: negative     POSITIONAL TESTING: Right Dix-Hallpike: no nystagmus Left Dix-Hallpike: no nystagmus Right Roll Test: no nystagmus Left Roll Test: no nystagmus  MOTION SENSITIVITY:  Motion Sensitivity Quotient Intensity: 0 = none, 1 = Lightheaded, 2 = Mild, 3 = Moderate, 4 = Severe, 5 = Vomiting  Intensity  1. Sitting to supine   2. Supine to L side   3. Supine to R side   4. Supine to sitting   5. L Hallpike-Dix   6. Up from L    7. R Hallpike-Dix   8. Up from R    9. Sitting, head tipped to L knee   10. Head up from L knee   11. Sitting, head tipped to R knee   12. Head up from R knee   13. Sitting head turns x5   14.Sitting head nods x5   15. In stance, 180 turn to L    16. In stance, 180 turn to R     OTHOSTATICS: supine 110/64, seated 112/66, standing 111/69    VESTIBULAR TREATMENT:    01/10/23 Neuromuscular re-ed: Habituation Seated smooth pursuit 2x30 sec (cues for quicker pace but smaller distance) horizontal only Standing feet 2" apart smooth pursuit 2x30 sec horizontal and vertical Standing feet 2" apart  saccades 2x30 sec horizontal and vertical Gaze stabilization Standing feet 2" apart VORx1 horizontal and vertical 2x30 sec Standing feet 2" apart VOR cancellation 2x30 sec  Optokinetic training Sitting -- visual stimuli with colors x30 sec Sitting -- visual stimuli with colors + VORx1 x30 sec Sitting -- horizontal bars moving black/white with smooth pursuit x30 sec                                                                                              DATE: 01/05/23 See HEP  PATIENT EDUCATION: Education details: PT POC and goals, HEP Person educated: Patient Education method: Explanation, Demonstration, and Handouts Education comprehension: verbalized understanding and returned demonstration  HOME EXERCISE PROGRAM: Access Code: [email or web address] URL: [email or web address] Date: 01/10/2023 Prepared by: Vernon Prey  April Kirstie Peri  Exercises - Seated Horizontal Smooth Pursuit  - 1 x daily - 7 x weekly - 3 sets - 10 reps - Seated Vertical Smooth Pursuit  - 1 x daily - 7 x weekly - 3 sets - 10 reps - Seated Proximal-Distal Smooth Pursuit  - 1 x daily - 7 x weekly - 3 sets - 10 reps - Standing Gaze Stabilization with Head Rotation  - 1 x daily - 7 x weekly - 2 sets - 30 sec hold - Standing Gaze Stabilization with Head Nod  - 1 x daily - 7 x weekly - 2 sets - 30 sec hold - Standing VOR Cancellation  -  1 x daily - 7 x weekly - 2 sets - 30 sec hold  GOALS: Goals reviewed with patient? Yes  SHORT TERM GOALS: Target date: 01/19/2023    Pt will be independent with initial HEP Baseline: Goal status: INITIAL  LONG TERM GOALS: Target date: 02/16/2023    Pt will reduce "dizzy spells" to <= 2x per month Baseline:  Goal status: INITIAL  2.  Pt will return to full work duties with no symptoms Baseline:  Goal status: INITIAL  3.  Pt will report he is able to watch TV x 1 hour with no symptoms Baseline:  Goal status: INITIAL  4.  Pt will be independent with advanced HEP Baseline:  Goal status: INITIAL   ASSESSMENT:  CLINICAL IMPRESSION: Treatment focused on progressing vestibular exercises into standing. Mostly symptomatic with VOR exercises but has been having less symptoms with smooth pursuit. Worked on optokinetic training to reduce dizziness with watching things on screen -- reports increased symptoms when provided bright yellow/green moving stimuli.   OBJECTIVE IMPAIRMENTS: decreased activity tolerance, decreased balance, and dizziness.     PLAN:  PT FREQUENCY: 1-2x/week  PT DURATION: 6 weeks  PLANNED INTERVENTIONS: Therapeutic exercises, Therapeutic activity, Neuromuscular re-education, Balance training, Gait training, Patient/Family education, Self Care, Joint mobilization, Vestibular training, Canalith repositioning, Dry Needling, Electrical stimulation, Cryotherapy, Moist heat,  Taping, Traction, Manual therapy, and Re-evaluation  PLAN FOR NEXT SESSION: assess response to HEP, continue vestibular treatment/optokinetic training   Mahnoor Mathisen April Ma L Sydelle Sherfield, PT 01/10/2023, 2:03 PM

## 2023-01-16 ENCOUNTER — Encounter: Payer: Self-pay | Admitting: Sports Medicine

## 2023-01-18 ENCOUNTER — Ambulatory Visit: Payer: Medicare HMO | Attending: Sports Medicine | Admitting: Physical Therapy

## 2023-01-18 ENCOUNTER — Encounter: Payer: Self-pay | Admitting: Physical Therapy

## 2023-01-18 ENCOUNTER — Encounter: Payer: Self-pay | Admitting: Sports Medicine

## 2023-01-18 DIAGNOSIS — R42 Dizziness and giddiness: Secondary | ICD-10-CM | POA: Insufficient documentation

## 2023-01-18 MED ORDER — FAMOTIDINE 20 MG PO TABS: 20.0000 mg | ORAL_TABLET | Freq: Every day | ORAL | 1 refills | Status: DC

## 2023-01-18 NOTE — Telephone Encounter (Signed)
Patient scheduled.

## 2023-01-18 NOTE — Therapy (Signed)
OUTPATIENT PHYSICAL THERAPY VESTIBULAR TREATMENT     Patient Name: James Frye MRN: [id number] DOB:12/02/1950, 72 y.o., male Today's Date: 01/18/2023  END OF SESSION:  PT End of Session - 01/18/23 0804     Visit Number 3    Number of Visits 12    Date for PT Re-Evaluation 02/16/23    Authorization Type [employer] medicare    Progress Note Due on Visit 10    PT Start Time 0804    PT Stop Time 0845    PT Time Calculation (min) 41 min    Activity Tolerance Patient tolerated treatment well    Behavior During Therapy [facility] for tasks assessed/performed             Past Medical History:  Diagnosis Date   Allergy not known, long ago   Anemia of chronic renal failure, stage 3b (HCC) 08/10/2022   CAD (coronary artery disease)    Diabetes mellitus    Hiatal hernia    Hypertension    Past Surgical History:  Procedure Laterality Date   APPENDECTOMY     CARDIAC CATHETERIZATION     GASTRIC BYPASS     Patient Active Problem List   Diagnosis Date Noted   Disability due to neurological disorder 12/08/2022   Small bowel obstruction (HCC) 08/18/2022   Strain of left pectoralis muscle 07/11/2022   Vitreous floater, bilateral 03/29/2022   Impingement syndrome, shoulder, right 02/15/2022   Chronic renal insufficiency, stage III (moderate) (HCC) 10/06/2021   Benign essential tremor 01/20/2021   Obstructive sleep apnea 10/22/2020   Generalized anxiety disorder 10/22/2020   Seborrheic keratosis 04/25/2019   Dupuytrens contracture 10/22/2018   Normocytic anemia 05/04/2018   Vertigo 05/02/2018   Right cervical radiculopathy 06/27/2016   Benign prostate hyperplasia 12/29/2015   Chronic cough 11/23/2015   CAD (coronary artery disease) 07/22/2014   Annual physical exam 11/07/2012   Insomnia 11/07/2012   Perennial allergic rhinitis 11/07/2012   H/O gastric bypass 10/09/2012   Erectile dysfunction associated with type 2 diabetes mellitus (HCC) 11/14/2011   S/P stroke due to  cerebrovascular disease 11/14/2011   Diabetes mellitus type 2, controlled (HCC) 11/14/2011    PCP: Benjamin Stain REFERRING PROVIDER: Benjamin Stain  REFERRING DIAG: Vertigo  THERAPY DIAG:  Dizziness and giddiness  ONSET DATE: 12/2022  Rationale for Evaluation and Treatment: Rehabilitation  SUBJECTIVE:   SUBJECTIVE STATEMENT: Pt states exercises are going alright. "I get a little cringe depending on how fast I go." No fainting spells. "I haven't been out much." No noticeable dizziness while in the house.  Pt accompanied by: self  PERTINENT HISTORY: history of vertigo 2020, CVA 2011  PAIN:  Are you having pain? No  PRECAUTIONS: None  WEIGHT BEARING RESTRICTIONS: No  FALLS: Has patient fallen in last 6 months?  No, near falls due to dizzy spells   PLOF: Independent  PATIENT GOALS: decrease episodes of dizziness  OBJECTIVE:   DIAGNOSTIC FINDINGS: MRI brain: 1. No acute intracranial abnormality. 2. Normal MRA of the head and neck. 3. Mild chronic small vessel disease.  COGNITION: Overall cognitive status: Within functional limits for tasks assessed  POSTURE:  rounded shoulders and forward head  Cervical ROM:   Some increased dizziness with cervical rotation, increased mm spasticity cervical paraspinals    VESTIBULAR ASSESSMENT: (Measures in this section from initial evaluation unless otherwise noted)    SYMPTOM BEHAVIOR:  Subjective history: see above  Non-Vestibular symptoms: headaches  Type of dizziness: Spinning/Vertigo  Frequency: 1-2x a week  Duration: 30 minutes  Aggravating factors: No known aggravating factors  Relieving factors: rest  Progression of symptoms: better  OCULOMOTOR EXAM:  Ocular Alignment: normal  Ocular ROM: No Limitations  Spontaneous Nystagmus: absent  Gaze-Induced Nystagmus: absent  Smooth Pursuits: intact some dizziness  Saccades: intact  Convergence: moderate dizziness  VESTIBULAR - OCULAR REFLEX:   Slow VOR:  Normal  VOR Cancellation: Unable to Maintain Gaze  Head-Impulse Test: HIT Right: negative HIT Left: negative     POSITIONAL TESTING: Right Dix-Hallpike: no nystagmus Left Dix-Hallpike: no nystagmus Right Roll Test: no nystagmus Left Roll Test: no nystagmus  MOTION SENSITIVITY:  Motion Sensitivity Quotient Intensity: 0 = none, 1 = Lightheaded, 2 = Mild, 3 = Moderate, 4 = Severe, 5 = Vomiting  Intensity  1. Sitting to supine   2. Supine to L side   3. Supine to R side   4. Supine to sitting   5. L Hallpike-Dix   6. Up from L    7. R Hallpike-Dix   8. Up from R    9. Sitting, head tipped to L knee   10. Head up from L knee   11. Sitting, head tipped to R knee   12. Head up from R knee   13. Sitting head turns x5   14.Sitting head nods x5   15. In stance, 180 turn to L    16. In stance, 180 turn to R     OTHOSTATICS:  From eval: supine 110/64, seated 112/66, standing 111/69 01/18/23: After standing on foam eyes closed, pt diaphoretic and dizzy with BP of 82/56; After supine with legs raised x 5 min, BP 116/80s   VESTIBULAR TREATMENT:    01/18/23 Neuromuscular re-ed: Habituation Standing on foam feet together saccades and corrective saccades 2x30 sec each [email or web address] Standing on foam feet together smooth pursuit 2x30 sec [email or web address] Vestibular ball 2x30 sec Gaze stabilization Standing on foam feet together VOR x2 horizontal and vertical 2x30 sec [email or web address] VOR x 1 horizontal and then vertical to metronome at 160 bpm x30 sec (too fast for pt), 140 bpm x30 sec;  On foam feet together Eyes closed static standing 2x30 sec (increased dizziness after performing this with BP drop 82/56, increased to 116/80s after laying supine x 5 min)   01/10/23 Neuromuscular re-ed: Habituation Seated smooth pursuit 2x30 sec (cues for quicker pace but smaller distance) horizontal only Standing  feet 2" apart smooth pursuit 2x30 sec horizontal and vertical Standing feet 2" apart  saccades 2x30 sec horizontal and vertical Gaze stabilization Standing feet 2" apart VORx1 horizontal and vertical 2x30 sec Standing feet 2" apart VOR cancellation 2x30 sec  Optokinetic training Sitting -- visual stimuli with colors x30 sec Sitting -- visual stimuli with colors + VORx1 x30 sec Sitting -- horizontal bars moving black/white with smooth pursuit x30 sec                                                                                              DATE: 01/05/23 See HEP  PATIENT EDUCATION: Education details: PT POC and goals, HEP Person educated: Patient Education method: Explanation, Demonstration, and Handouts Education comprehension: verbalized understanding  and returned demonstration  HOME EXERCISE PROGRAM: Access Code: [email or web address] URL: [email or web address] Date: 01/10/2023 Prepared by: Vernon Prey April Kirstie Peri  Exercises - Seated Horizontal Smooth Pursuit  - 1 x daily - 7 x weekly - 3 sets - 10 reps - Seated Vertical Smooth Pursuit  - 1 x daily - 7 x weekly - 3 sets - 10 reps - Seated Proximal-Distal Smooth Pursuit  - 1 x daily - 7 x weekly - 3 sets - 10 reps - Standing Gaze Stabilization with Head Rotation  - 1 x daily - 7 x weekly - 2 sets - 30 sec hold - Standing Gaze Stabilization with Head Nod  - 1 x daily - 7 x weekly - 2 sets - 30 sec hold - Standing VOR Cancellation  - 1 x daily - 7 x weekly - 2 sets - 30 sec hold  GOALS: Goals reviewed with patient? Yes  SHORT TERM GOALS: Target date: 01/19/2023   Pt will be independent with initial HEP Baseline: Goal status: MET  LONG TERM GOALS: Target date: 02/16/2023    Pt will reduce "dizzy spells" to <= 2x per month Baseline:  Goal status: INITIAL  2.  Pt will return to full work duties with no symptoms Baseline:  Goal status: INITIAL  3.  Pt will report he is able to watch TV x 1 hour with no  symptoms Baseline:  Goal status: INITIAL  4.  Pt will be independent with advanced HEP Baseline:  Goal status: INITIAL   ASSESSMENT:  CLINICAL IMPRESSION: Pt has been compliant with HEP. Further progressed pt's vestibular exercises and balance exercises into standing on foam this session. No issues with gaze stabilization and habituation exercises on foam. When tasked to perform eyes closed on foam, pt was more challenged with c/o increased knee aching. Upon sitting rest break, pt with notable diaphoresis, c/o increased dizziness and then short period of decreased responsiveness. BP found to have dropped to 82/56. Upon laying supine with feet raised, BP raised to normal levels with pt return to responsiveness. Pt will benefit from further medical work up.   OBJECTIVE IMPAIRMENTS: decreased activity tolerance, decreased balance, and dizziness.     PLAN:  PT FREQUENCY: 1-2x/week  PT DURATION: 6 weeks  PLANNED INTERVENTIONS: Therapeutic exercises, Therapeutic activity, Neuromuscular re-education, Balance training, Gait training, Patient/Family education, Self Care, Joint mobilization, Vestibular training, Canalith repositioning, Dry Needling, Electrical stimulation, Cryotherapy, Moist heat, Taping, Traction, Manual therapy, and Re-evaluation  PLAN FOR NEXT SESSION: assess response to HEP, continue vestibular treatment/optokinetic training. Check BP?   Zarie Kosiba April Ma L Temisha Murley, PT 01/18/2023, 8:04 AM

## 2023-01-19 ENCOUNTER — Ambulatory Visit: Payer: Medicare HMO | Admitting: Sports Medicine

## 2023-01-22 ENCOUNTER — Ambulatory Visit: Payer: Medicare HMO | Attending: Sports Medicine

## 2023-01-22 ENCOUNTER — Encounter: Payer: Self-pay | Admitting: Sports Medicine

## 2023-01-22 ENCOUNTER — Ambulatory Visit (INDEPENDENT_AMBULATORY_CARE_PROVIDER_SITE_OTHER): Payer: Medicare HMO | Admitting: Sports Medicine

## 2023-01-22 VITALS — BP 131/75 | HR 75

## 2023-01-22 DIAGNOSIS — N401 Enlarged prostate with lower urinary tract symptoms: Secondary | ICD-10-CM

## 2023-01-22 DIAGNOSIS — E119 Type 2 diabetes mellitus without complications: Secondary | ICD-10-CM | POA: Diagnosis not present

## 2023-01-22 DIAGNOSIS — R55 Syncope and collapse: Secondary | ICD-10-CM

## 2023-01-22 DIAGNOSIS — R0602 Shortness of breath: Secondary | ICD-10-CM | POA: Diagnosis not present

## 2023-01-22 DIAGNOSIS — R42 Dizziness and giddiness: Secondary | ICD-10-CM | POA: Diagnosis not present

## 2023-01-22 LAB — CBC: Platelets: 236 10*3/uL (ref 140–400)

## 2023-01-22 NOTE — Assessment & Plan Note (Addendum)
This is a very pleasant 72 year old male, he does have a history of diabetes, he is status post gastric bypass, and physical therapy he had an episode of syncope, he was standing up doing some vestibular exercises, felt somewhat dizzy/lightheaded, he had to sit down, he then laid flat and was unconscious shortly, he denies any concurrent chest pain, palpitations, only minimal shortness of breath during the episode. He came to and he felt a lot better in about 15 minutes. Physical therapist did check her blood pressure, the blood pressure was reported quite low, 82/56, no pulse rate recorded. This resolved quickly with raising of the legs. Orthostatic vital signs today were normal. He is taking Flomax. I do think he became orthostatic, we will check some labs, I will discontinue Flomax, I would like to add a Zio patch, and a consultation with cardiology.

## 2023-01-22 NOTE — Progress Notes (Unsigned)
Enrolled for [employer] to mail a ZIO XT long term holter monitor to the patients address on file.   DOD to read. 

## 2023-01-22 NOTE — Assessment & Plan Note (Signed)
We will cut back Flomax again, he is continuing to have what sounds to be orthostasis.

## 2023-01-22 NOTE — Progress Notes (Addendum)
    Procedures performed today:    None.  Independent interpretation of notes and tests performed by another provider:   None.  Brief History, Exam, Impression, and Recommendations:    Syncope This is a very pleasant 72 year old male, he does have a history of diabetes, he is status post gastric bypass, and physical therapy he had an episode of syncope, he was standing up doing some vestibular exercises, felt somewhat dizzy/lightheaded, he had to sit down, he then laid flat and was unconscious shortly, he denies any concurrent chest pain, palpitations, only minimal shortness of breath during the episode. He came to and he felt a lot better in about 15 minutes. Physical therapist did check her blood pressure, the blood pressure was reported quite low, 82/56, no pulse rate recorded. This resolved quickly with raising of the legs. Orthostatic vital signs today were normal. He is taking Flomax. I do think he became orthostatic, we will check some labs, I will discontinue Flomax, I would like to add a Zio patch, and a consultation with cardiology.   Benign prostate hyperplasia We will cut back Flomax again, he is continuing to have what sounds to be orthostasis.    ____________________________________________ [name]. Benjamin Stain, M.D., ABFM., CAQSM., AME. Primary Care and Sports Medicine [facility]  Adjunct Professor of Family Medicine  [employer]  Restaurant manager, fast [employer]

## 2023-01-22 NOTE — Addendum Note (Signed)
Addended by: [name] on: 01/22/2023 11:29 AM   Modules accepted: Orders

## 2023-01-23 ENCOUNTER — Encounter: Payer: Self-pay | Admitting: Sports Medicine

## 2023-01-23 LAB — BRAIN NATRIURETIC PEPTIDE: Brain Natriuretic Peptide: 25 pg/mL (ref <100)

## 2023-01-23 LAB — HEMOGLOBIN A1C
Hgb A1c MFr Bld: 7 % of total Hgb — ABNORMAL HIGH (ref <5.7)
Mean Plasma Glucose: 154 mg/dL
eAG (mmol/L): 8.5 mmol/L

## 2023-01-23 LAB — COMPREHENSIVE METABOLIC PANEL
AG Ratio: 1.3 (calc) (ref 1.0–2.5)
ALT: 20 U/L (ref 9–46)
AST: 18 U/L (ref 10–35)
Albumin: 4.3 g/dL (ref 3.6–5.1)
Alkaline phosphatase (APISO): 74 U/L (ref 35–144)
BUN/Creatinine Ratio: 15 (calc) (ref 6–22)
BUN: 31 mg/dL — ABNORMAL HIGH (ref 7–25)
CO2: 24 mmol/L (ref 20–32)
Calcium: 9.4 mg/dL (ref 8.6–10.3)
Chloride: 104 mmol/L (ref 98–110)
Creat: 2.1 mg/dL — ABNORMAL HIGH (ref 0.70–1.28)
Globulin: 3.2 g/dL (calc) (ref 1.9–3.7)
Glucose, Bld: 83 mg/dL (ref 65–99)
Potassium: 5 mmol/L (ref 3.5–5.3)
Sodium: 138 mmol/L (ref 135–146)
Total Bilirubin: 0.4 mg/dL (ref 0.2–1.2)
Total Protein: 7.5 g/dL (ref 6.1–8.1)

## 2023-01-23 LAB — CBC
HCT: 34.2 % — ABNORMAL LOW (ref 38.5–50.0)
Hemoglobin: 10.9 g/dL — ABNORMAL LOW (ref 13.2–17.1)
MCH: 26.3 pg — ABNORMAL LOW (ref 27.0–33.0)
MCHC: 31.9 g/dL — ABNORMAL LOW (ref 32.0–36.0)
MCV: 82.6 fL (ref 80.0–100.0)
MPV: 10.4 fL (ref 7.5–12.5)
RBC: 4.14 10*6/uL — ABNORMAL LOW (ref 4.20–5.80)
RDW: 13.8 % (ref 11.0–15.0)
WBC: 6 10*3/uL (ref 3.8–10.8)

## 2023-01-23 LAB — TSH: TSH: 0.77 mIU/L (ref 0.40–4.50)

## 2023-01-24 DIAGNOSIS — R55 Syncope and collapse: Secondary | ICD-10-CM

## 2023-01-25 ENCOUNTER — Encounter: Payer: Self-pay | Admitting: Physical Therapy

## 2023-01-25 ENCOUNTER — Ambulatory Visit: Payer: Medicare HMO | Admitting: Physical Therapy

## 2023-01-25 DIAGNOSIS — R42 Dizziness and giddiness: Secondary | ICD-10-CM

## 2023-01-25 NOTE — Therapy (Signed)
OUTPATIENT PHYSICAL THERAPY VESTIBULAR TREATMENT     Patient Name: James Frye MRN: [id number] DOB:11-23-1950, 72 y.o., male Today's Date: 01/25/2023  END OF SESSION:  PT End of Session - 01/25/23 0851     Visit Number 4    Number of Visits 12    Date for PT Re-Evaluation 02/16/23    Authorization Type [employer] medicare    Progress Note Due on Visit 10    PT Start Time 0850    PT Stop Time 0930    PT Time Calculation (min) 40 min    Activity Tolerance Patient tolerated treatment well    Behavior During Therapy [facility] for tasks assessed/performed             Past Medical History:  Diagnosis Date   Allergy not known, long ago   Anemia of chronic renal failure, stage 3b (HCC) 08/10/2022   CAD (coronary artery disease)    Diabetes mellitus    Hiatal hernia    Hypertension    Past Surgical History:  Procedure Laterality Date   APPENDECTOMY     CARDIAC CATHETERIZATION     GASTRIC BYPASS     Patient Active Problem List   Diagnosis Date Noted   Syncope 01/22/2023   Disability due to neurological disorder 12/08/2022   Small bowel obstruction (HCC) 08/18/2022   Strain of left pectoralis muscle 07/11/2022   Vitreous floater, bilateral 03/29/2022   Impingement syndrome, shoulder, right 02/15/2022   Chronic renal insufficiency, stage III (moderate) (HCC) 10/06/2021   Benign essential tremor 01/20/2021   Obstructive sleep apnea 10/22/2020   Generalized anxiety disorder 10/22/2020   Seborrheic keratosis 04/25/2019   Dupuytrens contracture 10/22/2018   Normocytic anemia 05/04/2018   Vertigo 05/02/2018   Right cervical radiculopathy 06/27/2016   Benign prostate hyperplasia 12/29/2015   Chronic cough 11/23/2015   CAD (coronary artery disease) 07/22/2014   Annual physical exam 11/07/2012   Insomnia 11/07/2012   Perennial allergic rhinitis 11/07/2012   H/O gastric bypass 10/09/2012   Erectile dysfunction associated with type 2 diabetes mellitus (HCC) 11/14/2011   S/P  stroke due to cerebrovascular disease 11/14/2011   Diabetes mellitus type 2, controlled (HCC) 11/14/2011    PCP: Benjamin Stain REFERRING PROVIDER: Benjamin Stain  REFERRING DIAG: Vertigo  THERAPY DIAG:  Dizziness and giddiness  ONSET DATE: 12/2022  Rationale for Evaluation and Treatment: Rehabilitation  SUBJECTIVE:   SUBJECTIVE STATEMENT: Pt reports he got the Holter monitor last night. Pt has an appointment 02/11/23 for Cardiology. Pt notes no other fainting spells.  Pt accompanied by: self  PERTINENT HISTORY: history of vertigo 2020, CVA 2011  PAIN:  Are you having pain? No  PRECAUTIONS: None  WEIGHT BEARING RESTRICTIONS: No  FALLS: Has patient fallen in last 6 months?  No, near falls due to dizzy spells   PLOF: Independent  PATIENT GOALS: decrease episodes of dizziness  OBJECTIVE:   DIAGNOSTIC FINDINGS: MRI brain: 1. No acute intracranial abnormality. 2. Normal MRA of the head and neck. 3. Mild chronic small vessel disease.  COGNITION: Overall cognitive status: Within functional limits for tasks assessed  POSTURE:  rounded shoulders and forward head  Cervical ROM:   Some increased dizziness with cervical rotation, increased mm spasticity cervical paraspinals    VESTIBULAR ASSESSMENT: (Measures in this section from initial evaluation unless otherwise noted)    SYMPTOM BEHAVIOR:  Subjective history: see above  Non-Vestibular symptoms: headaches  Type of dizziness: Spinning/Vertigo  Frequency: 1-2x a week  Duration: 30 minutes  Aggravating factors: No known aggravating  factors  Relieving factors: rest  Progression of symptoms: better  OCULOMOTOR EXAM:  Ocular Alignment: normal  Ocular ROM: No Limitations  Spontaneous Nystagmus: absent  Gaze-Induced Nystagmus: absent  Smooth Pursuits: intact some dizziness  Saccades: intact  Convergence: moderate dizziness  VESTIBULAR - OCULAR REFLEX:   Slow VOR: Normal  VOR Cancellation: Unable to Maintain  Gaze  Head-Impulse Test: HIT Right: negative HIT Left: negative     POSITIONAL TESTING: Right Dix-Hallpike: no nystagmus Left Dix-Hallpike: no nystagmus Right Roll Test: no nystagmus Left Roll Test: no nystagmus  MOTION SENSITIVITY:  Motion Sensitivity Quotient Intensity: 0 = none, 1 = Lightheaded, 2 = Mild, 3 = Moderate, 4 = Severe, 5 = Vomiting  Intensity  1. Sitting to supine   2. Supine to L side   3. Supine to R side   4. Supine to sitting   5. L Hallpike-Dix   6. Up from L    7. R Hallpike-Dix   8. Up from R    9. Sitting, head tipped to L knee   10. Head up from L knee   11. Sitting, head tipped to R knee   12. Head up from R knee   13. Sitting head turns x5   14.Sitting head nods x5   15. In stance, 180 turn to L    16. In stance, 180 turn to R     OTHOSTATICS:  From eval: supine 110/64, seated 112/66, standing 111/69 01/18/23: After standing on foam eyes closed, pt diaphoretic and dizzy with BP of 82/56; After supine with legs raised x 5 min, BP 116/80s 01/25/23: On entry seated 112/71, HR 78 BPM; after 30 min of standing vestibular exercises 102/64, HR 75 BPM;   VESTIBULAR TREATMENT:    01/25/23 Neuromuscular re-ed: Oculomotor standing on foam feet together Smooth pursuit horizontal and vertical 2x30 sec each Ball throw up 2x30 sec Ball bounce 2x30 sec Vestibular ball 2x30 sec Gaze stabilization standing on foam feet together VOR x 1 at 140 BPM 2x30 sec horizontal and then vertical VOR x 2 (sitting and then standing) at 120 BPM 2x30 sec horizontal and vertical  Balance Feet together on foam eyes closed 2x30 sec Feet together on foam, eyes closed with head nods and then head turns x 30 sec each  01/18/23 Neuromuscular re-ed: Habituation Standing on foam feet together saccades and corrective saccades 2x30 sec each [email or web address] Standing on foam feet together smooth pursuit 2x30 sec  [email or web address] Vestibular ball 2x30 sec Gaze stabilization Standing on foam feet together VOR x2 horizontal and vertical 2x30 sec [email or web address] VOR x 1 horizontal and then vertical to metronome at 160 bpm x30 sec (too fast for pt), 140 bpm x30 sec;  On foam feet together Eyes closed static standing 2x30 sec (increased dizziness after performing this with BP drop 82/56, increased to 116/80s after laying supine x 5 min)   01/10/23 Neuromuscular re-ed: Habituation Seated smooth pursuit 2x30 sec (cues for quicker pace but smaller distance) horizontal only Standing feet 2" apart smooth pursuit 2x30 sec horizontal and vertical Standing feet 2" apart  saccades 2x30 sec horizontal and vertical Gaze stabilization Standing feet 2" apart VORx1 horizontal and vertical 2x30 sec Standing feet 2" apart VOR cancellation 2x30 sec  Optokinetic training Sitting -- visual stimuli with colors x30 sec Sitting -- visual stimuli with colors + VORx1 x30 sec Sitting -- horizontal bars moving black/white with smooth pursuit x30 sec  DATE: 01/05/23 See HEP  PATIENT EDUCATION: Education details: PT POC and goals, HEP Person educated: Patient Education method: Explanation, Demonstration, and Handouts Education comprehension: verbalized understanding and returned demonstration  HOME EXERCISE PROGRAM: Access Code: [email or web address] URL: [email or web address] Date: 01/10/2023 Prepared by: Vernon Prey April Kirstie Peri  Exercises - Seated Horizontal Smooth Pursuit  - 1 x daily - 7 x weekly - 3 sets - 10 reps - Seated Vertical Smooth Pursuit  - 1 x daily - 7 x weekly - 3 sets - 10 reps - Seated Proximal-Distal Smooth Pursuit  - 1 x daily - 7 x weekly - 3 sets - 10 reps - Standing Gaze Stabilization with Head Rotation  - 1 x daily - 7 x weekly - 2 sets - 30 sec  hold - Standing Gaze Stabilization with Head Nod  - 1 x daily - 7 x weekly - 2 sets - 30 sec hold - Standing VOR Cancellation  - 1 x daily - 7 x weekly - 2 sets - 30 sec hold  GOALS: Goals reviewed with patient? Yes  SHORT TERM GOALS: Target date: 01/19/2023   Pt will be independent with initial HEP Baseline: Goal status: MET  LONG TERM GOALS: Target date: 02/16/2023    Pt will reduce "dizzy spells" to <= 2x per month Baseline:  Goal status: INITIAL  2.  Pt will return to full work duties with no symptoms Baseline:  Goal status: INITIAL  3.  Pt will report he is able to watch TV x 1 hour with no symptoms Baseline:  Goal status: INITIAL  4.  Pt will be independent with advanced HEP Baseline:  Goal status: INITIAL   ASSESSMENT:  CLINICAL IMPRESSION: Continued to work on pt's balance and vestibular exercises on foam this session. Monitored BP throughout. Noted some drop with prolonged static standing on foam; however, pt not symptomatic. Discussed with pt to work on his balance exercises at home and to monitor his BP as well.   OBJECTIVE IMPAIRMENTS: decreased activity tolerance, decreased balance, and dizziness.     PLAN:  PT FREQUENCY: 1-2x/week  PT DURATION: 6 weeks  PLANNED INTERVENTIONS: Therapeutic exercises, Therapeutic activity, Neuromuscular re-education, Balance training, Gait training, Patient/Family education, Self Care, Joint mobilization, Vestibular training, Canalith repositioning, Dry Needling, Electrical stimulation, Cryotherapy, Moist heat, Taping, Traction, Manual therapy, and Re-evaluation  PLAN FOR NEXT SESSION: assess response to HEP, continue vestibular treatment/optokinetic training. Monitor BP   Eder Macek April Ma L Iveth Heidemann, PT 01/25/2023, 8:51 AM

## 2023-01-31 NOTE — Progress Notes (Unsigned)
Cardiology Office Note:   Date:  02/01/2023  NAME:  James Frye    MRN: [id number] DOB:  1951/02/17   PCP:  [name], MD  Cardiologist:  None  Electrophysiologist:  None   Referring MD: [name],*   Chief Complaint  Patient presents with   Loss of Consciousness         History of Present Illness:   James Frye is a 72 y.o. male with a hx of DM, CKD, HTN, HLD who is being seen today for the evaluation of syncope at the request of [name], MD. he tells me for the past 4 to 6 months has had recurrent syncopal episodes.  He describes episodes where he gets dizzy and lightheaded.  He tells me he is passed out on at least 2 occasions.  He passed out in January.  He also had a passing out spell on 01/18/2023 at vestibular therapy.  He tells me some of the episodes occur with change in position of his head.  He describes a prodrome and symptoms which include dizziness, diaphoresis and lightheadedness.  On 01/18/2023 he was undergoing vestibular therapy. Had a syncopal episode during treatment with BP drop to 82/56. Symptoms resolved with laying flat.  He reports no chest pain or trouble breathing prior to the episodes.  No heart racing.  New medications include Farxiga.  He was admitted to the hospital in January with a small bowel obstruction and underwent lysis of adhesions.  He has had prior gastric bypass surgery.  He tells me he has not checked his blood sugar during these episodes.  He is diabetic with an A1c of 7.0.  He does have CKD with a creatinine of 2.0.  Lipids are at goal.  His only new medication is Com[place].  We did discuss that this can result in dehydration.  He drinks very little water up to 30 ounces per day.  He has no chest pain or shortness of breath.  He currently works as a Museum/gallery exhibitions officer.  We discussed that he cannot go back to driving until this has been worked up and resolved.  He underwent a stress test in 2021 that was normal.   Also had a normal echo in 2021.  We discussed repeating an echo.  His primary care physician has ordered a monitor.  TSH 0.77.  BNP value was normal at 25.  His hemoglobin is slightly low at 10.9.  CV examination unremarkable.  EKG is normal.  His condition is thought to have been related to vertigo.  Brain MRI has been ordered which shows no acute abnormality.  He does have mild small vessel disease.  MRA of the neck is normal.  TTE 2021 -> 55-60% NM Stress 2021 -> Normal  HGB 10.9 TSH 0.77  Problem List DM -A1c 7.0 HLD -T chol 166, HDL 116, LDL 36, TG 68 HTN CKD IIIb -eGFR 41  Past Medical History: Past Medical History:  Diagnosis Date   Allergy not known, long ago   Anemia of chronic renal failure, stage 3b (HCC) 08/10/2022   CAD (coronary artery disease)    Diabetes mellitus    Hiatal hernia    Hypertension     Past Surgical History: Past Surgical History:  Procedure Laterality Date   APPENDECTOMY     CARDIAC CATHETERIZATION     GASTRIC BYPASS      Current Medications: No outpatient medications have been marked as taking for the 02/01/23 encounter (Office Visit)  with O'Neal, Ronnald Ramp, MD.     Allergies:    Penicillins   Social History: Social History   Socioeconomic History   Marital status: Married    Spouse name: [name]   Number of children: 3   Years of education: 14   Highest education level: Some college, no degree  Occupational History    Comment: works full time  Tobacco Use   Smoking status: Former   Smokeless tobacco: Never  Building services engineer Use: Never used  Substance and Sexual Activity   Alcohol use: No   Drug use: No   Sexual activity: Not Currently  Other Topics Concern   Not on file  Social History Narrative   Lives with his wife and step-son. He works two full-time jobs. He enjoys working and doesn't have time for any hobbies.    Social Determinants of Health   Financial Resource Strain: Low Risk  (12/25/2022)   Overall  Financial Resource Strain (CARDIA)    Difficulty of Paying Living Expenses: Not hard at all  Food Insecurity: No Food Insecurity (12/25/2022)   Hunger Vital Sign    Worried About Running Out of Food in the Last Year: Never true    Ran Out of Food in the Last Year: Never true  Transportation Needs: No Transportation Needs (12/25/2022)   PRAPARE - Administrator, Civil Service (Medical): No    Lack of Transportation (Non-Medical): No  Physical Activity: Inactive (12/25/2022)   Exercise Vital Sign    Days of Exercise per Week: 0 days    Minutes of Exercise per Session: 0 min  Stress: No Stress Concern Present (12/25/2022)   [employer] of Occupational Health - Occupational Stress Questionnaire    Feeling of Stress : Only a little  Social Connections: Socially Integrated (12/25/2022)   Social Connection and Isolation Panel [NHANES]    Frequency of Communication with Friends and Family: More than three times a week    Frequency of Social Gatherings with Friends and Family: Once a week    Attends Religious Services: More than 4 times per year    Active Member of [employer] or Organizations: Yes    Attends Engineer, structural: More than 4 times per year    Marital Status: Married     Family History: The patient's family history includes Cancer in his mother; Heart disease in his father; Stroke in his father.  ROS:   All other ROS reviewed and negative. Pertinent positives noted in the HPI.     EKGs/Labs/Other Studies Reviewed:   The following studies were personally reviewed by me today:  EKG:  EKG is ordered today.    EKG Interpretation: Sinus rhythm heart rate 75, no acute ischemic changes or evidence of infarction  Recent Labs: 01/22/2023: ALT 20; Brain Natriuretic Peptide 25; BUN 31; Creat 2.10; Hemoglobin 10.9; Platelets 236; Potassium 5.0; Sodium 138; TSH 0.77   Recent Lipid Panel    Component Value Date/Time   CHOL 166 10/31/2022 1126   TRIG 68  10/31/2022 1126   HDL 116 10/31/2022 1126   CHOLHDL 1.4 10/31/2022 1126   VLDL 9 06/27/2016 0839   LDLCALC 36 10/31/2022 1126    Physical Exam:   VS:  There were no vitals taken for this visit.   Wt Readings from Last 3 Encounters:  12/28/22 194 lb (88 kg)  10/31/22 195 lb (88.5 kg)  09/29/22 188 lb (85.3 kg)    General: Well nourished, well developed, in  no acute distress Head: Atraumatic, normal size  Eyes: PEERLA, EOMI  Neck: Supple, no JVD Endocrine: No thryomegaly Cardiac: Normal S1, S2; RRR; no murmurs, rubs, or gallops Lungs: Clear to auscultation bilaterally, no wheezing, rhonchi or rales  Abd: Soft, nontender, no hepatomegaly  Ext: No edema, pulses 2+ Musculoskeletal: No deformities, BUE and BLE strength normal and equal Skin: Warm and dry, no rashes   Neuro: Alert and oriented to person, place, time, and situation, CNII-XII grossly intact, no focal deficits  Psych: Normal mood and affect   ASSESSMENT:   James Frye is a 72 y.o. male who presents for the following: 1. Vasovagal syncope     PLAN:   1. Vasovagal syncope -Recurrent episodes of syncope.  EKG is normal.  CV exam is normal.  Symptoms occur with change in position of his head.  They do sound vestibular in nature.  He is undergoing vestibular rehab.  He had an episode while at rehab.  His blood pressure became quite low and resolved with laying on the ground.  He describes a prodrome of symptoms which include dizziness and lightheadedness.  He can actually sit down and circumvent these episodes. -A1c is within limits.  I have asked him to check his blood sugar if they happen again.  They could be hypoglycemic in nature however his blood pressure seems to be quite stable during the episodes. -He needs a repeat echo.  Echo in 2021 and stress test were normal. -His thyroid and hemoglobin values are within limits. -All of his brain imaging has been normal including MRI of the brain and MRA of the neck.  Does  not appear to be a primary neurological issue.  No concern for seizure per history. -I see no other medications that could explain this besides jardiance.  This can cause dehydration and I do wonder if this is contributing.  I have asked him to hold this for now.  This was started in January and it does coincide with his symptomology. -Overall, they sound like vasovagal episodes versus orthostatic episode of hypotension.  He is still having despite vestibular therapy. Suspect it could be exacerbated by dehydration.  He is only drinking 30 ounces of water per day.  He was asked to double this.  He is also on Jardiance which causes hypovolemia.  I have asked him to stop the Jardiance. -We will follow-up the results of his echo and his monitor.  In the meantime he will try to increase his hydration and stop [place].  I did explain to him that he cannot drive until this is resolved and he goes a period of time with no further episodes.  He works as a Environmental health practitioner.  Disposition: Return if symptoms worsen or fail to improve.  Medication Adjustments/Labs and Tests Ordered: Current medicines are reviewed at length with the patient today.  Concerns regarding medicines are outlined above.  No orders of the defined types were placed in this encounter.  No orders of the defined types were placed in this encounter.  There are no Patient Instructions on file for this visit.    Signed, Lenna Gilford. Flora Lipps, MD, [facility]  [facility]  [address] [place], [place] [address] [phone]  02/01/2023 3:43 PM

## 2023-02-01 ENCOUNTER — Encounter: Payer: Self-pay | Admitting: Cardiovascular Disease

## 2023-02-01 ENCOUNTER — Encounter: Payer: Self-pay | Admitting: Physical Therapy

## 2023-02-01 ENCOUNTER — Other Ambulatory Visit: Payer: Self-pay

## 2023-02-01 ENCOUNTER — Ambulatory Visit: Payer: Medicare HMO | Admitting: Physical Therapy

## 2023-02-01 ENCOUNTER — Ambulatory Visit: Payer: Medicare HMO | Attending: Cardiovascular Disease | Admitting: Cardiovascular Disease

## 2023-02-01 VITALS — BP 128/72 | HR 75 | Ht 67.0 in | Wt 199.4 lb

## 2023-02-01 DIAGNOSIS — R42 Dizziness and giddiness: Secondary | ICD-10-CM | POA: Diagnosis not present

## 2023-02-01 DIAGNOSIS — R55 Syncope and collapse: Secondary | ICD-10-CM | POA: Diagnosis not present

## 2023-02-01 NOTE — Patient Instructions (Addendum)
Medication Instructions:  STOP Jardiance   *If you need a refill on your cardiac medications before your next appointment, please call your pharmacy*   Testing/Procedures:  Echocardiogram - Your physician has requested that you have an echocardiogram. Echocardiography is a painless test that uses sound waves to create images of your heart. It provides your doctor with information about the size and shape of your heart and how well your heart's chambers and valves are working. This procedure takes approximately one hour. There are no restrictions for this procedure.    Follow-Up: At [facility], you and your health needs are our priority.  As part of our continuing mission to provide you with exceptional heart care, we have created designated Provider Care Teams.  These Care Teams include your primary Cardiologist (physician) and Advanced Practice Providers (APPs -  Physician Assistants and Nurse Practitioners) who all work together to provide you with the care you need, when you need it.  We recommend signing up for the patient portal called "MyChart".  Sign up information is provided on this After Visit Summary.  MyChart is used to connect with patients for Virtual Visits (Telemedicine).  Patients are able to view lab/test results, encounter notes, upcoming appointments, etc.  Non-urgent messages can be sent to your provider as well.   To learn more about what you can do with MyChart, go to [email or web address].    Your next appointment:   2 months  Provider:   Lennie Odor, MD

## 2023-02-01 NOTE — Therapy (Signed)
OUTPATIENT PHYSICAL THERAPY VESTIBULAR TREATMENT   Patient Name: James Frye MRN: [id number] DOB:[birth year], 72 y.o., male Today's Date: 02/01/2023  END OF SESSION:  PT End of Session - 02/01/23 0848     Visit Number 5    Number of Visits 12    Date for PT Re-Evaluation 02/16/23    Authorization Type [employer] medicare    Progress Note Due on Visit 10    PT Start Time 0848    PT Stop Time 0930    PT Time Calculation (min) 42 min    Activity Tolerance Patient tolerated treatment well    Behavior During Therapy [facility] for tasks assessed/performed              Past Medical History:  Diagnosis Date   Allergy not known, long ago   Anemia of chronic renal failure, stage 3b (HCC) 08/10/2022   CAD (coronary artery disease)    Diabetes mellitus    Hiatal hernia    Hypertension    Past Surgical History:  Procedure Laterality Date   APPENDECTOMY     CARDIAC CATHETERIZATION     GASTRIC BYPASS     Patient Active Problem List   Diagnosis Date Noted   Syncope 01/22/2023   Disability due to neurological disorder 12/08/2022   Small bowel obstruction (HCC) 08/18/2022   Strain of left pectoralis muscle 07/11/2022   Vitreous floater, bilateral 03/29/2022   Impingement syndrome, shoulder, right 02/15/2022   Chronic renal insufficiency, stage III (moderate) (HCC) 10/06/2021   Benign essential tremor 01/20/2021   Obstructive sleep apnea 10/22/2020   Generalized anxiety disorder 10/22/2020   Seborrheic keratosis 04/25/2019   Dupuytrens contracture 10/22/2018   Normocytic anemia 05/04/2018   Vertigo 05/02/2018   Right cervical radiculopathy 06/27/2016   Benign prostate hyperplasia 12/29/2015   Chronic cough 11/23/2015   CAD (coronary artery disease) 07/22/2014   Annual physical exam 11/07/2012   Insomnia 11/07/2012   Perennial allergic rhinitis 11/07/2012   H/O gastric bypass 10/09/2012   Erectile dysfunction associated with type 2 diabetes mellitus (HCC) 11/14/2011   S/P  stroke due to cerebrovascular disease 11/14/2011   Diabetes mellitus type 2, controlled (HCC) 11/14/2011    PCP: Benjamin Stain REFERRING PROVIDER: Benjamin Stain  REFERRING DIAG: Vertigo  THERAPY DIAG:  Dizziness and giddiness  ONSET DATE: 12/2022  Rationale for Evaluation and Treatment: Rehabilitation  SUBJECTIVE:   SUBJECTIVE STATEMENT: Pt states he had another fainting spell on Tuesday. Pt reports he was doing his exercises with eyes closed. Will be seeing cardiologist today. Holter monitor will stay on for another week. Pt accompanied by: self  PERTINENT HISTORY: history of vertigo 2020, CVA 2011  PAIN:  Are you having pain? No  PRECAUTIONS: None  WEIGHT BEARING RESTRICTIONS: No  FALLS: Has patient fallen in last 6 months?  No, near falls due to dizzy spells  PLOF: Independent  PATIENT GOALS: decrease episodes of dizziness  OBJECTIVE:   DIAGNOSTIC FINDINGS: MRI brain: 1. No acute intracranial abnormality. 2. Normal MRA of the head and neck. 3. Mild chronic small vessel disease.  COGNITION: Overall cognitive status: Within functional limits for tasks assessed  POSTURE:  rounded shoulders and forward head  Cervical ROM:   Some increased dizziness with cervical rotation, increased mm spasticity cervical paraspinals    VESTIBULAR ASSESSMENT: (Measures in this section from initial evaluation unless otherwise noted)    SYMPTOM BEHAVIOR:  Subjective history: see above  Non-Vestibular symptoms: headaches  Type of dizziness: Spinning/Vertigo  Frequency: 1-2x a week  Duration: 30  minutes  Aggravating factors: No known aggravating factors  Relieving factors: rest  Progression of symptoms: better  OCULOMOTOR EXAM:  Ocular Alignment: normal  Ocular ROM: No Limitations  Spontaneous Nystagmus: absent  Gaze-Induced Nystagmus: absent  Smooth Pursuits: intact some dizziness  Saccades: intact  Convergence: moderate dizziness  VESTIBULAR - OCULAR REFLEX:    Slow VOR: Normal  VOR Cancellation: Unable to Maintain Gaze  Head-Impulse Test: HIT Right: negative HIT Left: negative     POSITIONAL TESTING: Right Dix-Hallpike: no nystagmus Left Dix-Hallpike: no nystagmus Right Roll Test: no nystagmus Left Roll Test: no nystagmus  MOTION SENSITIVITY:  Motion Sensitivity Quotient Intensity: 0 = none, 1 = Lightheaded, 2 = Mild, 3 = Moderate, 4 = Severe, 5 = Vomiting  Intensity  1. Sitting to supine   2. Supine to L side   3. Supine to R side   4. Supine to sitting   5. L Hallpike-Dix   6. Up from L    7. R Hallpike-Dix   8. Up from R    9. Sitting, head tipped to L knee   10. Head up from L knee   11. Sitting, head tipped to R knee   12. Head up from R knee   13. Sitting head turns x5   14.Sitting head nods x5   15. In stance, 180 turn to L    16. In stance, 180 turn to R     OTHOSTATICS:  From eval: supine 110/64, seated 112/66, standing 111/69 01/18/23: After standing on foam eyes closed, pt diaphoretic and dizzy with BP of 82/56; After supine with legs raised x 5 min, BP 116/80s 01/25/23: On entry seated 112/71, HR 78 BPM; after 30 min of standing vestibular exercises 102/64, HR 75 BPM;  02/01/23: On entry seated 128/85, HR 71 BPM; after foam exercises with EO 112/72, HR 67 BPM,   VESTIBULAR TREATMENT:    02/01/23 Neuromuscular re-ed: Standing on foam feet together EO (head started to hurt -- drop in BP see above) Head turns 2x30 sec Head nods 2x30 sec Slow marching 2x10 Standing on foam staggered stance (no BP change) Static standing EO x30 sec R and then L foot back  Static standing EC x30 sec R and then L foot back EO with head turns x30 sec R and then L foot back EO with head nods x30 sec R and then L foot back Standing on foam feet together EC 2x30 sec Amb with head turns x2 laps Amb with head nods x2 laps Backwards amb x2 laps Amb with eyes closed 2x20' Tandem walking 2x10  01/25/23 Neuromuscular re-ed: Oculomotor  standing on foam feet together Smooth pursuit horizontal and vertical 2x30 sec each Ball throw up 2x30 sec Ball bounce 2x30 sec Vestibular ball 2x30 sec Gaze stabilization standing on foam feet together VOR x 1 at 140 BPM 2x30 sec horizontal and then vertical VOR x 2 (sitting and then standing) at 120 BPM 2x30 sec horizontal and vertical  Balance Feet together on foam eyes closed 2x30 sec Feet together on foam, eyes closed with head nods and then head turns x 30 sec each  01/18/23 Neuromuscular re-ed: Habituation Standing on foam feet together saccades and corrective saccades 2x30 sec each [email or web address] Standing on foam feet together smooth pursuit 2x30 sec [email or web address] Vestibular ball 2x30 sec Gaze stabilization Standing on foam feet together VOR x2 horizontal and vertical 2x30 sec [email or web address] VOR x 1 horizontal and then vertical to metronome at 160 bpm x30 sec (too fast  for pt), 140 bpm x30 sec;  On foam feet together Eyes closed static standing 2x30 sec (increased dizziness after performing this with BP drop 82/56, increased to 116/80s after laying supine x 5 min)   01/10/23 Neuromuscular re-ed: Habituation Seated smooth pursuit 2x30 sec (cues for quicker pace but smaller distance) horizontal only Standing feet 2" apart smooth pursuit 2x30 sec horizontal and vertical Standing feet 2" apart  saccades 2x30 sec horizontal and vertical Gaze stabilization Standing feet 2" apart VORx1 horizontal and vertical 2x30 sec Standing feet 2" apart VOR cancellation 2x30 sec  Optokinetic training Sitting -- visual stimuli with colors x30 sec Sitting -- visual stimuli with colors + VORx1 x30 sec Sitting -- horizontal bars moving black/white with smooth pursuit x30 sec                                                                                              DATE: 01/05/23 See HEP  PATIENT  EDUCATION: Education details: PT POC and goals, HEP Person educated: Patient Education method: Explanation, Demonstration, and Handouts Education comprehension: verbalized understanding and returned demonstration  HOME EXERCISE PROGRAM: Access Code: [email or web address] URL: [email or web address] Date: 01/10/2023 Prepared by: Vernon Prey April Kirstie Peri  Exercises - Seated Horizontal Smooth Pursuit  - 1 x daily - 7 x weekly - 3 sets - 10 reps - Seated Vertical Smooth Pursuit  - 1 x daily - 7 x weekly - 3 sets - 10 reps - Seated Proximal-Distal Smooth Pursuit  - 1 x daily - 7 x weekly - 3 sets - 10 reps - Standing Gaze Stabilization with Head Rotation  - 1 x daily - 7 x weekly - 2 sets - 30 sec hold - Standing Gaze Stabilization with Head Nod  - 1 x daily - 7 x weekly - 2 sets - 30 sec hold - Standing VOR Cancellation  - 1 x daily - 7 x weekly - 2 sets - 30 sec hold  GOALS: Goals reviewed with patient? Yes  SHORT TERM GOALS: Target date: 01/19/2023   Pt will be independent with initial HEP Baseline: Goal status: MET  LONG TERM GOALS: Target date: 02/16/2023    Pt will reduce "dizzy spells" to <= 2x per month Baseline:  Goal status: INITIAL  2.  Pt will return to full work duties with no symptoms Baseline:  Goal status: INITIAL  3.  Pt will report he is able to watch TV x 1 hour with no symptoms Baseline:  Goal status: INITIAL  4.  Pt will be independent with advanced HEP Baseline:  Goal status: INITIAL   ASSESSMENT:  CLINICAL IMPRESSION: Continued to advance pt's vestibular/balance exercises on foam. One instance of BP dropping after performing head turns on foam with eyes open. BP remained stable the rest of PT session. Good tolerance to dynamic balance exercises.   OBJECTIVE IMPAIRMENTS: decreased activity tolerance, decreased balance, and dizziness.     PLAN:  PT FREQUENCY: 1-2x/week  PT DURATION: 6 weeks  PLANNED INTERVENTIONS: Therapeutic exercises,  Therapeutic activity, Neuromuscular re-education, Balance training, Gait training, Patient/Family education, Self Care, Joint mobilization, Vestibular training, Canalith repositioning, Dry Needling, Electrical  stimulation, Cryotherapy, Moist heat, Taping, Traction, Manual therapy, and Re-evaluation  PLAN FOR NEXT SESSION: assess response to HEP, continue vestibular treatment/optokinetic training. Monitor BP   Zoltan Genest April Ma L [place], PT 02/01/2023, 8:48 AM

## 2023-02-06 ENCOUNTER — Other Ambulatory Visit: Payer: Self-pay | Admitting: Sports Medicine

## 2023-02-06 NOTE — Telephone Encounter (Signed)
Requesting rx rf of buspar 5mg  Last written 08/18/2022 Last OV 01/22/2023 No upcoming appt schld.

## 2023-02-08 ENCOUNTER — Ambulatory Visit: Payer: Medicare HMO | Admitting: Sports Medicine

## 2023-02-10 DIAGNOSIS — R55 Syncope and collapse: Secondary | ICD-10-CM | POA: Diagnosis not present

## 2023-02-12 ENCOUNTER — Encounter: Payer: Self-pay | Admitting: Sports Medicine

## 2023-03-01 ENCOUNTER — Ambulatory Visit (HOSPITAL_COMMUNITY): Payer: Medicare HMO | Attending: Cardiovascular Disease

## 2023-03-01 DIAGNOSIS — R55 Syncope and collapse: Secondary | ICD-10-CM | POA: Diagnosis not present

## 2023-03-01 LAB — ECHOCARDIOGRAM COMPLETE
Area-P 1/2: 3.19 cm2
S' Lateral: 2.7 cm

## 2023-03-01 MED ORDER — PERFLUTREN LIPID MICROSPHERE
1.0000 mL | INTRAVENOUS | Status: AC | PRN
Start: 2023-03-01 — End: 2023-03-01
  Administered 2023-03-01: 3 mL via INTRAVENOUS

## 2023-03-05 ENCOUNTER — Encounter: Payer: Self-pay | Admitting: Cardiovascular Disease

## 2023-03-05 ENCOUNTER — Encounter: Payer: Self-pay | Admitting: Sports Medicine

## 2023-03-06 ENCOUNTER — Ambulatory Visit (INDEPENDENT_AMBULATORY_CARE_PROVIDER_SITE_OTHER): Payer: Medicare HMO | Admitting: Sports Medicine

## 2023-03-06 VITALS — BP 157/74 | HR 95

## 2023-03-06 DIAGNOSIS — N401 Enlarged prostate with lower urinary tract symptoms: Secondary | ICD-10-CM

## 2023-03-06 LAB — POCT URINALYSIS DIP (CLINITEK)
Bilirubin, UA: NEGATIVE
Blood, UA: NEGATIVE
Glucose, UA: NEGATIVE mg/dL
Ketones, POC UA: NEGATIVE mg/dL
Leukocytes, UA: NEGATIVE
Nitrite, UA: NEGATIVE
POC PROTEIN,UA: 30 — AB
Spec Grav, UA: 1.025 (ref 1.010–1.025)
Urobilinogen, UA: 0.2 E.U./dL
pH, UA: 5.5 (ref 5.0–8.0)

## 2023-03-06 MED ORDER — SILODOSIN 4 MG PO CAPS: 4.0000 mg | ORAL_CAPSULE | Freq: Every day | ORAL | 11 refills | Status: DC

## 2023-03-06 MED ORDER — MIRABEGRON ER 25 MG PO TB24: 25.0000 mg | ORAL_TABLET | Freq: Every day | ORAL | 11 refills | Status: DC

## 2023-03-06 NOTE — Telephone Encounter (Signed)
Patient scheduled.

## 2023-03-06 NOTE — Progress Notes (Signed)
    Procedures performed today:    None.  Independent interpretation of notes and tests performed by another provider:   None.  Brief History, Exam, Impression, and Recommendations:    Benign prostate hyperplasia Michele Mcalpine returns, he is a pleasant 72 year old male with obstructive uropathy, imaging historically has shown a very large nodular prostate, he does have a urologist. Historically he had symptoms moderately controlled on high-dose Flomax, tadalafil. He did develop excessive orthostasis on even lower doses of Flomax and so this was stopped. He tells me he is still taking tadalafil, if not on his med list so I will add it. He continues to have episodes of urgency, frequency, occasional leaking and dribbling, he was urinating up to every 2 hours. He cut back coffee and this improved to some degree. Urinalysis today was negative. I am going to try to get him Rapaflo, as well as Myrbetriq, and a does have an appointment set up with his urologist. For insurance coverage purposes Myrbetriq is needed due to the low risk of anticholinergic side effects in this 72 year old male.    ____________________________________________ [name]. Benjamin Stain, M.D., ABFM., CAQSM., AME. Primary Care and Sports Medicine [facility]  Adjunct Professor of Family Medicine  [employer]  Restaurant manager, fast food

## 2023-03-06 NOTE — Assessment & Plan Note (Signed)
James Frye returns, he is a pleasant 72 year old male with obstructive uropathy, imaging historically has shown a very large nodular prostate, he does have a urologist. Historically he had symptoms moderately controlled on high-dose Flomax, tadalafil. He did develop excessive orthostasis on even lower doses of Flomax and so this was stopped. He tells me he is still taking tadalafil, if not on his med list so I will add it. He continues to have episodes of urgency, frequency, occasional leaking and dribbling, he was urinating up to every 2 hours. He cut back coffee and this improved to some degree. Urinalysis today was negative. I am going to try to get him Rapaflo, as well as Myrbetriq, and a does have an appointment set up with his urologist. For insurance coverage purposes Myrbetriq is needed due to the low risk of anticholinergic side effects in this 72 year old male.

## 2023-03-07 ENCOUNTER — Encounter: Payer: Self-pay | Admitting: Sports Medicine

## 2023-03-08 NOTE — Telephone Encounter (Signed)
He will drop it off, this is typically able to be filled out by staff

## 2023-03-12 ENCOUNTER — Encounter (INDEPENDENT_AMBULATORY_CARE_PROVIDER_SITE_OTHER): Payer: Medicare HMO | Admitting: Sports Medicine

## 2023-03-12 DIAGNOSIS — I491 Atrial premature depolarization: Secondary | ICD-10-CM

## 2023-03-13 NOTE — Telephone Encounter (Signed)
I spent 5 total minutes of online digital evaluation and management services in this patient-initiated request for online care. 

## 2023-03-28 NOTE — Progress Notes (Unsigned)
Cardiology Office Note:   Date:  03/29/2023  NAME:  James Frye    MRN: [id number] DOB:  [birth year]   PCP:  [name], MD  Cardiologist:  None  Electrophysiologist:  None   Referring MD: [name],*   Chief Complaint  Patient presents with   Follow-up    History of Present Illness:   [name] is a 72 y.o. male with a hx of DM, HLD who presents for follow-up. Seen for syncope concerning for vasovagal episodes vs orthostatic hypotension. Jardiance stopped.  He reports no further episodes of dizziness or lightheadedness since stopping Jardiance.  He is also drinking 6 glasses or more of water daily.  I suspect his symptoms are related to dehydration or vasovagal episodes.  He had no further episodes.  He may return to work.  Monitor was normal.  Echo was normal.  Cholesterol levels are at goal.  Denies any chest pains or trouble breathing.  Not exercising but overall doing quite well.  Problem List DM -A1c 7.0 HLD -T chol 166, HDL 116, LDL 36, TG 68 HTN CKD IIIb -eGFR 41  Past Medical History: Past Medical History:  Diagnosis Date   Allergy not known, long ago   Anemia of chronic renal failure, stage 3b (HCC) 08/10/2022   Diabetes mellitus    Hiatal hernia    Hypertension    Small bowel obstruction (HCC)     Past Surgical History: Past Surgical History:  Procedure Laterality Date   APPENDECTOMY     CARDIAC CATHETERIZATION     GASTRIC BYPASS     LAPAROSCOPIC LYSIS OF ADHESIONS      Current Medications: Current Meds  Medication Sig   busPIRone (BUSPAR) 5 MG tablet TAKE 1 TO 2 TABLETS(5 TO 10 MG) BY MOUTH TWICE DAILY   calcium carbonate (OS-CAL) 1250 (500 Ca) MG chewable tablet Chew by mouth 2 (two) times daily with a meal.   Continuous Blood Gluc Sensor (FREESTYLE LIBRE 3 SENSOR) MISC 1 each by Does not apply route in the morning, at noon, and at bedtime. Place 1 sensor on the skin every 14 days. Use to check glucose continuously.    famotidine (PEPCID) 20 MG tablet Take 1 tablet (20 mg total) by mouth daily.   fluticasone (FLONASE) 50 MCG/ACT nasal spray Place 2 sprays into both nostrils daily.   hydrOXYzine (ATARAX) 50 MG tablet Take 1 tablet (50 mg total) by mouth at bedtime.   mirabegron ER (MYRBETRIQ) 25 MG TB24 tablet Take 1 tablet (25 mg total) by mouth daily.   omeprazole (PRILOSEC) 40 MG capsule Take 40 mg by mouth at bedtime.   primidone (MYSOLINE) 50 MG tablet 2 tabs at bedtime for a week then 3 tabs at bedtime for a week then 4 tabs at bedtime if needed   rosuvastatin (CRESTOR) 10 MG tablet Take 10 mg by mouth daily. RX from Dr Haroldine Laws   silodosin (RAPAFLO) 4 MG CAPS capsule Take 1 capsule (4 mg total) by mouth daily with breakfast.   sitaGLIPtin (JANUVIA) 50 MG tablet Take 1 tablet (50 mg total) by mouth daily.   tadalafil (CIALIS) 5 MG tablet Take 1 tablet (5 mg total) by mouth daily.     Allergies:    Penicillins   Social History: Social History   Socioeconomic History   Marital status: Married    Spouse name: [name]   Number of children: 3   Years of education: 14   Highest education level: Some college, no  degree  Occupational History    Comment: works full time  Tobacco Use   Smoking status: Former   Smokeless tobacco: Never  Advertising account planner   Vaping status: Never Used  Substance and Sexual Activity   Alcohol use: No   Drug use: No   Sexual activity: Not Currently  Other Topics Concern   Not on file  Social History Narrative   Lives with his wife and step-son. He works two full-time jobs. He enjoys working and doesn't have time for any hobbies.    Social Determinants of Health   Financial Resource Strain: Low Risk  (12/25/2022)   Overall Financial Resource Strain (CARDIA)    Difficulty of Paying Living Expenses: Not hard at all  Food Insecurity: No Food Insecurity (12/25/2022)   Hunger Vital Sign    Worried About Running Out of Food in the Last Year: Never true    Ran Out of Food in  the Last Year: Never true  Transportation Needs: No Transportation Needs (12/25/2022)   PRAPARE - Administrator, Civil Service (Medical): No    Lack of Transportation (Non-Medical): No  Physical Activity: Inactive (12/25/2022)   Exercise Vital Sign    Days of Exercise per Week: 0 days    Minutes of Exercise per Session: 0 min  Stress: No Stress Concern Present (12/25/2022)   [employer] of Occupational Health - Occupational Stress Questionnaire    Feeling of Stress : Only a little  Social Connections: Socially Integrated (12/25/2022)   Social Connection and Isolation Panel [NHANES]    Frequency of Communication with Friends and Family: More than three times a week    Frequency of Social Gatherings with Friends and Family: Once a week    Attends Religious Services: More than 4 times per year    Active Member of [employer] or Organizations: Yes    Attends Engineer, structural: More than 4 times per year    Marital Status: Married     Family History: The patient's family history includes Cancer in his mother; Heart disease in his father; Stroke in his father.  ROS:   All other ROS reviewed and negative. Pertinent positives noted in the HPI.     EKGs/Labs/Other Studies Reviewed:   The following studies were personally reviewed by me today:  EKG:  EKG is not ordered today.        TTE 03/01/2023  1. Left ventricular ejection fraction, by estimation, is 55 to 60%. The  left ventricle has normal function. The left ventricle has no regional  wall motion abnormalities. There is mild asymmetric left ventricular  hypertrophy of the basal-septal segment.  Left ventricular diastolic parameters are consistent with Grade I  diastolic dysfunction (impaired relaxation).   2. Right ventricular systolic function is normal. The right ventricular  size is normal. Tricuspid regurgitation signal is inadequate for assessing  PA pressure.   3. The mitral valve is normal in  structure. Trivial mitral valve  regurgitation. No evidence of mitral stenosis.   4. The aortic valve is tricuspid. Aortic valve regurgitation is not  visualized. No aortic stenosis is present.   5. The inferior vena cava is normal in size with greater than 50%  respiratory variability, suggesting right atrial pressure of 3 mmHg.   Recent Labs: 01/22/2023: ALT 20; Brain Natriuretic Peptide 25; BUN 31; Creat 2.10; Hemoglobin 10.9; Platelets 236; Potassium 5.0; Sodium 138; TSH 0.77   Recent Lipid Panel    Component Value Date/Time   CHOL  166 10/31/2022 1126   TRIG 68 10/31/2022 1126   HDL 116 10/31/2022 1126   CHOLHDL 1.4 10/31/2022 1126   VLDL 9 06/27/2016 0839   LDLCALC 36 10/31/2022 1126    Physical Exam:   VS:  BP 112/68 (BP Location: Left Arm, Patient Position: Sitting, Cuff Size: Large)   Pulse 82   Ht 5\' 7"  (1.702 m)   Wt 205 lb 12.8 oz (93.4 kg)   SpO2 96%   BMI 32.23 kg/m    Wt Readings from Last 3 Encounters:  03/29/23 205 lb 12.8 oz (93.4 kg)  02/01/23 199 lb 6.4 oz (90.4 kg)  12/28/22 194 lb (88 kg)    General: Well nourished, well developed, in no acute distress Head: Atraumatic, normal size  Eyes: PEERLA, EOMI  Neck: Supple, no JVD Endocrine: No thryomegaly Cardiac: Normal S1, S2; RRR; no murmurs, rubs, or gallops Lungs: Clear to auscultation bilaterally, no wheezing, rhonchi or rales  Abd: Soft, nontender, no hepatomegaly  Ext: No edema, pulses 2+ Musculoskeletal: No deformities, BUE and BLE strength normal and equal Skin: Warm and dry, no rashes   Neuro: Alert and oriented to person, place, time, and situation, CNII-XII grossly intact, no focal deficits  Psych: Normal mood and affect   ASSESSMENT:   JEDI BARBOZA is a 72 y.o. male who presents for the following: 1. Vasovagal syncope   2. Orthostasis     PLAN:   1. Vasovagal syncope 2. Orthostasis -Strongly suspect his syncopal episodes were related to dehydration and Jardiance use.  He has  stopped Jardiance and drinking more water.  No further episodes.  His echo was normal.  His monitor is normal.  He may return to work without restrictions.  He also may resume driving.  He describes no symptoms of angina.  His lipids are at goal.  Blood pressure is at goal.  He will see [place] yearly.      Disposition: Return in about 1 year (around 03/28/2024).  Medication Adjustments/Labs and Tests Ordered: Current medicines are reviewed at length with the patient today.  Concerns regarding medicines are outlined above.  No orders of the defined types were placed in this encounter.  No orders of the defined types were placed in this encounter.  Patient Instructions  Medication Instructions:  Continue same medications *If you need a refill on your cardiac medications before your next appointment, please call your pharmacy*   Lab Work: None ordered   Testing/Procedures: None ordered   Follow-Up: At [facility], you and your health needs are our priority.  As part of our continuing mission to provide you with exceptional heart care, we have created designated Provider Care Teams.  These Care Teams include your primary Cardiologist (physician) and Advanced Practice Providers (APPs -  Physician Assistants and Nurse Practitioners) who all work together to provide you with the care you need, when you need it.  We recommend signing up for the patient portal called "MyChart".  Sign up information is provided on this After Visit Summary.  MyChart is used to connect with patients for Virtual Visits (Telemedicine).  Patients are able to view lab/test results, encounter notes, upcoming appointments, etc.  Non-urgent messages can be sent to your provider as well.   To learn more about what you can do with MyChart, go to [email or web address].    Your next appointment:  1 year    Provider:  Dr.O'Neal     Time Spent with Patient: I have spent a total of 25  minutes with patient  reviewing hospital notes, telemetry, EKGs, labs and examining the patient as well as establishing an assessment and plan that was discussed with the patient.  > 50% of time was spent in direct patient care.  Signed, Lenna Gilford. Flora Lipps, MD, [facility]  [facility]  [address] [place], [place] [address] [phone]  03/29/2023 3:20 PM

## 2023-03-29 ENCOUNTER — Encounter: Payer: Self-pay | Admitting: Sports Medicine

## 2023-03-29 ENCOUNTER — Encounter: Payer: Self-pay | Admitting: Cardiovascular Disease

## 2023-03-29 ENCOUNTER — Ambulatory Visit: Payer: Medicare HMO | Attending: Cardiovascular Disease | Admitting: Cardiovascular Disease

## 2023-03-29 VITALS — BP 112/68 | HR 82 | Ht 67.0 in | Wt 205.8 lb

## 2023-03-29 DIAGNOSIS — I951 Orthostatic hypotension: Secondary | ICD-10-CM

## 2023-03-29 DIAGNOSIS — R55 Syncope and collapse: Secondary | ICD-10-CM

## 2023-03-29 NOTE — Patient Instructions (Signed)
Medication Instructions:  Continue same medications *If you need a refill on your cardiac medications before your next appointment, please call your pharmacy*   Lab Work: None ordered   Testing/Procedures: None ordered   Follow-Up: At [facility], you and your health needs are our priority.  As part of our continuing mission to provide you with exceptional heart care, we have created designated Provider Care Teams.  These Care Teams include your primary Cardiologist (physician) and Advanced Practice Providers (APPs -  Physician Assistants and Nurse Practitioners) who all work together to provide you with the care you need, when you need it.  We recommend signing up for the patient portal called "MyChart".  Sign up information is provided on this After Visit Summary.  MyChart is used to connect with patients for Virtual Visits (Telemedicine).  Patients are able to view lab/test results, encounter notes, upcoming appointments, etc.  Non-urgent messages can be sent to your provider as well.   To learn more about what you can do with MyChart, go to [email or web address].    Your next appointment:  1 year    Provider:  Dr.O'Neal

## 2023-03-30 ENCOUNTER — Encounter: Payer: Self-pay | Admitting: Sports Medicine

## 2023-04-01 ENCOUNTER — Other Ambulatory Visit: Payer: Self-pay | Admitting: Sports Medicine

## 2023-04-01 DIAGNOSIS — G250 Essential tremor: Secondary | ICD-10-CM

## 2023-04-05 ENCOUNTER — Encounter: Payer: Self-pay | Admitting: Sports Medicine

## 2023-04-05 DIAGNOSIS — F5101 Primary insomnia: Secondary | ICD-10-CM

## 2023-04-05 MED ORDER — HYDROXYZINE HCL 50 MG PO TABS
50.0000 mg | ORAL_TABLET | Freq: Every day | ORAL | 1 refills | Status: DC
Start: 2023-04-05 — End: 2023-10-04

## 2023-04-06 ENCOUNTER — Encounter: Payer: Self-pay | Admitting: Sports Medicine

## 2023-04-06 ENCOUNTER — Ambulatory Visit (INDEPENDENT_AMBULATORY_CARE_PROVIDER_SITE_OTHER): Payer: Medicare HMO | Admitting: Sports Medicine

## 2023-04-06 VITALS — BP 132/78 | HR 73 | Resp 20 | Ht 67.0 in | Wt 210.0 lb

## 2023-04-06 DIAGNOSIS — N401 Enlarged prostate with lower urinary tract symptoms: Secondary | ICD-10-CM | POA: Diagnosis not present

## 2023-04-06 DIAGNOSIS — E119 Type 2 diabetes mellitus without complications: Secondary | ICD-10-CM

## 2023-04-06 DIAGNOSIS — J029 Acute pharyngitis, unspecified: Secondary | ICD-10-CM | POA: Diagnosis not present

## 2023-04-06 LAB — POC COVID19 BINAXNOW: SARS Coronavirus 2 Ag: NEGATIVE

## 2023-04-06 MED ORDER — SITAGLIPTIN PHOSPHATE 100 MG PO TABS: 100.0000 mg | ORAL_TABLET | Freq: Every day | ORAL | 11 refills | Status: DC

## 2023-04-06 MED ORDER — OXYBUTYNIN CHLORIDE 5 MG PO TABS
5.0000 mg | ORAL_TABLET | Freq: Every day | ORAL | 3 refills | Status: DC
Start: 2023-04-06 — End: 2023-08-06

## 2023-04-06 NOTE — Addendum Note (Signed)
Addended by: Latanya Presser on: 04/06/2023 02:40 PM   Modules accepted: Orders

## 2023-04-06 NOTE — Assessment & Plan Note (Signed)
This 71 year old male has obstructive uropathy, he likely has overactive bladder as well, we have tried multiple medications including Flomax, tadalafil, Rapaflo, Myrbetriq, nothing has worked. Discontinue the above medications, we will try oxybutynin at night. I do think some of his urinary frequency is coming from diabetes under poor control.

## 2023-04-06 NOTE — Progress Notes (Signed)
    Procedures performed today:    None.  Independent interpretation of notes and tests performed by another provider:   None.  Brief History, Exam, Impression, and Recommendations:    Diabetes mellitus type 2, controlled Improved presyncopal symptoms off of Jardiance, he is continuing to have polyuria, had glycosuria on the urinalysis at the last visit. Increasing Januvia to 100 mg daily, we will recheck his hemoglobin A1c in 3 months, if still elevated we will need to add glipizide.  Benign prostate hyperplasia This 72 year old male has obstructive uropathy, he likely has overactive bladder as well, we have tried multiple medications including Flomax, tadalafil, Rapaflo, Myrbetriq, nothing has worked. Discontinue the above medications, we will try oxybutynin at night. I do think some of his urinary frequency is coming from diabetes under poor control.    ____________________________________________ [name]. Benjamin Stain, M.D., ABFM., CAQSM., AME. Primary Care and Sports Medicine [facility] [facility]  Adjunct Professor of Family Medicine  [place] [employer] [facility] [employer]  Restaurant manager, fast food

## 2023-04-06 NOTE — Assessment & Plan Note (Signed)
Improved presyncopal symptoms off of Jardiance, he is continuing to have polyuria, had glycosuria on the urinalysis at the last visit. Increasing Januvia to 100 mg daily, we will recheck his hemoglobin A1c in 3 months, if still elevated we will need to add glipizide.

## 2023-04-09 ENCOUNTER — Encounter: Payer: Self-pay | Admitting: Sports Medicine

## 2023-04-10 ENCOUNTER — Telehealth: Payer: Self-pay | Admitting: Sports Medicine

## 2023-04-10 ENCOUNTER — Encounter: Payer: Self-pay | Admitting: Sports Medicine

## 2023-04-10 NOTE — Telephone Encounter (Signed)
Letter written, it will be in my chart for him to download.

## 2023-04-10 NOTE — Telephone Encounter (Signed)
Patient called in stating that the return to work note needs to have a return date of 04/16/23.  Please advise

## 2023-04-25 DIAGNOSIS — N1832 Chronic kidney disease, stage 3b: Secondary | ICD-10-CM | POA: Diagnosis not present

## 2023-04-25 DIAGNOSIS — E1122 Type 2 diabetes mellitus with diabetic chronic kidney disease: Secondary | ICD-10-CM | POA: Diagnosis not present

## 2023-04-25 DIAGNOSIS — D631 Anemia in chronic kidney disease: Secondary | ICD-10-CM | POA: Diagnosis not present

## 2023-04-25 DIAGNOSIS — I10 Essential (primary) hypertension: Secondary | ICD-10-CM | POA: Diagnosis not present

## 2023-04-25 DIAGNOSIS — D472 Monoclonal gammopathy: Secondary | ICD-10-CM | POA: Diagnosis not present

## 2023-04-25 DIAGNOSIS — N183 Chronic kidney disease, stage 3 unspecified: Secondary | ICD-10-CM | POA: Diagnosis not present

## 2023-04-30 ENCOUNTER — Other Ambulatory Visit: Payer: Self-pay | Admitting: *Deleted

## 2023-04-30 DIAGNOSIS — Z125 Encounter for screening for malignant neoplasm of prostate: Secondary | ICD-10-CM

## 2023-05-01 NOTE — Progress Notes (Signed)
Patient: James Frye           Date of Birth: Aug 15, 1950           MRN: [id number] Visit Date: 04/30/2023 PCP: [name], MD  Prostate Cancer Screening Date of last physical exam: 04/06/23 Have you ever had any of the following?: Enlarged prostate Have you ever had or been told you have an allergy to latex products?: No Are you currently taking any natural prostate preparations?: No Are you currently experiencing any urinary symptoms?: Yes If yes, please explain::  (currently taking meds for urinary symptoms)  Prostate Exam Exam not completed. PSA blood test completed only.  Patient's History Patient Active Problem List   Diagnosis Date Noted   Syncope 01/22/2023   Disability due to neurological disorder 12/08/2022   Small bowel obstruction (HCC) 08/18/2022   Strain of left pectoralis muscle 07/11/2022   Vitreous floater, bilateral 03/29/2022   Impingement syndrome, shoulder, right 02/15/2022   Chronic renal insufficiency, stage III (moderate) (HCC) 10/06/2021   Benign essential tremor 01/20/2021   Obstructive sleep apnea 10/22/2020   Generalized anxiety disorder 10/22/2020   Seborrheic keratosis 04/25/2019   Dupuytrens contracture 10/22/2018   Normocytic anemia 05/04/2018   Vertigo 05/02/2018   Right cervical radiculopathy 06/27/2016   Benign prostate hyperplasia 12/29/2015   Chronic cough 11/23/2015   CAD (coronary artery disease) 07/22/2014   Annual physical exam 11/07/2012   Insomnia 11/07/2012   Perennial allergic rhinitis 11/07/2012   H/O gastric bypass 10/09/2012   Erectile dysfunction associated with type 2 diabetes mellitus (HCC) 11/14/2011   S/P stroke due to cerebrovascular disease 11/14/2011   Diabetes mellitus type 2, controlled (HCC) 11/14/2011   Past Medical History:  Diagnosis Date   Allergy not known, long ago   Anemia of chronic renal failure, stage 3b (HCC) 08/10/2022   Diabetes mellitus    Hiatal hernia    Hypertension    Small  bowel obstruction (HCC)     Family History  Problem Relation Age of Onset   Cancer Mother    Stroke Father    Heart disease Father     Social History   Occupational History    Comment: works full time  Tobacco Use   Smoking status: Former   Smokeless tobacco: Never  Advertising account planner   Vaping status: Never Used  Substance and Sexual Activity   Alcohol use: No   Drug use: No   Sexual activity: Not Currently

## 2023-05-03 ENCOUNTER — Telehealth: Payer: Self-pay

## 2023-05-03 ENCOUNTER — Telehealth: Payer: Self-pay | Admitting: Sports Medicine

## 2023-05-03 NOTE — Telephone Encounter (Signed)
Okay please let him know that I would like to see him back in about a month to recheck. ___________________________________________ [name]. Benjamin Stain, M.D., ABFM., CAQSM., AME. Primary Care and Sports Medicine [place] [facility]  Adjunct Professor of Family Medicine  [employer] [facility] of Medicine  Restaurant manager, fast food   ===View-only below this line=== ----- Message ----- From: Narda Rutherford, LPN Sent: 7/82/9562   3:11 PM EDT To: [name], MD; * Subject: Abnormal PSA                                   Hi,   Mr. Malden came to our PSA screening and education event at the [facility] on 04/30/2023. PSA results are elevated at 4.3. He did inform us that he is currently taking meds to help with frequent urination. Patient was informed results and we would forward this information to your office for further recommendations.   Thank you, Clois Dupes, LPN [facility] [employer] [phone]

## 2023-05-03 NOTE — Telephone Encounter (Addendum)
Patient informed, will contact Dr. Charm Rings office to schedule.  ----- Message from James Frye sent at 05/03/2023  3:51 PM EDT ----- Regarding: RE: Abnormal PSA Okay please let him know that I would like to see him back in about a month to recheck. ___________________________________________ James Frye. Benjamin Stain, M.D., ABFM., CAQSM., AME. Primary Care and Sports Medicine [place] [facility]  Adjunct Professor of Family Medicine  [employer] [facility] [employer]  Restaurant manager, fast food ----- Message ----- From: Narda Rutherford, LPN Sent: 4/54/0981   3:11 PM EDT To: [name], MD; # Subject: Abnormal PSA                                   Hi,   [name] came to our PSA screening and education event at the [facility] on 04/30/2023. PSA results are elevated at 4.3. He did inform us that he is currently taking meds to help with frequent urination. Patient was informed results and we would forward this information to your office for further recommendations.   Thank you, Clois Dupes, LPN [facility] [employer] [phone]

## 2023-05-03 NOTE — Telephone Encounter (Addendum)
Patient returned call and was informed PSA results, elevated at 4.3. Patient stated that he was prescribed medication to help slow down frequent urination. Patient was informed that we would forward this information to Dr. Benjamin Stain (PCP), follow any recommendations provided, verbalized understanding.      Attempted to contact patient regarding lab results. Left message on voicemail requesting a return call.

## 2023-06-18 ENCOUNTER — Encounter: Payer: Self-pay | Admitting: Sports Medicine

## 2023-06-19 ENCOUNTER — Telehealth: Payer: Self-pay | Admitting: Sports Medicine

## 2023-06-19 NOTE — Telephone Encounter (Signed)
Patient dropped off document  Verification of Chronic Condition , to be filled out by provider. Patient requested to send it back via Call Patient to pick up within 5-days. Document is located in providers tray at front office.Please advise at Mobile [phone] (mobile)

## 2023-07-09 ENCOUNTER — Ambulatory Visit: Payer: Medicare HMO | Admitting: Sports Medicine

## 2023-07-11 ENCOUNTER — Ambulatory Visit (INDEPENDENT_AMBULATORY_CARE_PROVIDER_SITE_OTHER): Payer: Medicare HMO | Admitting: Sports Medicine

## 2023-07-11 ENCOUNTER — Encounter: Payer: Self-pay | Admitting: Sports Medicine

## 2023-07-11 VITALS — BP 154/93 | HR 60 | Ht 67.0 in | Wt 202.0 lb

## 2023-07-11 DIAGNOSIS — E119 Type 2 diabetes mellitus without complications: Secondary | ICD-10-CM

## 2023-07-11 NOTE — Progress Notes (Signed)
    Procedures performed today:    None.  Independent interpretation of notes and tests performed by another provider:   None.  Brief History, Exam, Impression, and Recommendations:    Diabetes mellitus type 2, controlled James Frye was having significant presyncopal symptoms on Jardiance, this has been discontinued, he did have some polyuria and glycosuria on a urinalysis, we increase Januvia to 100 mg daily and we will check the hemoglobin A1c. If persistent elevation we will add glipizide, he did not tolerate metformin in the past.    ____________________________________________ James Frye. James Frye, M.D., ABFM., CAQSM., AME. Primary Care and Sports Medicine [place] [facility]  Adjunct Professor of Family Medicine  [place] [employer] [facility] of Medicine  Restaurant manager, fast food

## 2023-07-11 NOTE — Assessment & Plan Note (Signed)
James Frye was having significant presyncopal symptoms on Jardiance, this has been discontinued, James Frye did have some polyuria and glycosuria on a urinalysis, we increase Januvia to 100 mg daily and we will check the hemoglobin A1c. If persistent elevation we will add glipizide, James Frye did not tolerate metformin in the past.

## 2023-07-12 LAB — TSH: TSH: 1.57 u[IU]/mL (ref 0.450–4.500)

## 2023-07-12 LAB — COMPREHENSIVE METABOLIC PANEL
ALT: 20 [IU]/L (ref 0–44)
AST: 20 [IU]/L (ref 0–40)
Albumin: 4.5 g/dL (ref 3.9–4.9)
Alkaline Phosphatase: 96 [IU]/L (ref 44–121)
BUN/Creatinine Ratio: 12 (ref 10–24)
BUN: 21 mg/dL (ref 8–27)
Bilirubin Total: 0.2 mg/dL (ref 0.0–1.2)
CO2: 19 mmol/L — ABNORMAL LOW (ref 20–29)
Calcium: 9.3 mg/dL (ref 8.6–10.2)
Chloride: 101 mmol/L (ref 96–106)
Creatinine, Ser: 1.72 mg/dL — ABNORMAL HIGH (ref 0.76–1.27)
Globulin, Total: 2.9 g/dL (ref 1.5–4.5)
Glucose: 103 mg/dL — ABNORMAL HIGH (ref 70–99)
Potassium: 5.1 mmol/L (ref 3.5–5.2)
Sodium: 138 mmol/L (ref 134–144)
Total Protein: 7.4 g/dL (ref 6.0–8.5)
eGFR: 42 mL/min/{1.73_m2} — ABNORMAL LOW (ref >59)

## 2023-07-12 LAB — T4, FREE: Free T4: 1 ng/dL (ref 0.82–1.77)

## 2023-07-12 LAB — HEMOGLOBIN A1C
Est. average glucose Bld gHb Est-mCnc: 143 mg/dL
Hgb A1c MFr Bld: 6.6 % — ABNORMAL HIGH (ref 4.8–5.6)

## 2023-07-12 LAB — T3, FREE: T3, Free: 3 pg/mL (ref 2.0–4.4)

## 2023-07-14 ENCOUNTER — Other Ambulatory Visit: Payer: Self-pay | Admitting: Sports Medicine

## 2023-07-20 DIAGNOSIS — N400 Enlarged prostate without lower urinary tract symptoms: Secondary | ICD-10-CM | POA: Diagnosis not present

## 2023-07-20 DIAGNOSIS — I10 Essential (primary) hypertension: Secondary | ICD-10-CM | POA: Diagnosis not present

## 2023-07-20 DIAGNOSIS — N529 Male erectile dysfunction, unspecified: Secondary | ICD-10-CM | POA: Diagnosis not present

## 2023-07-25 DIAGNOSIS — E119 Type 2 diabetes mellitus without complications: Secondary | ICD-10-CM | POA: Diagnosis not present

## 2023-07-25 DIAGNOSIS — H524 Presbyopia: Secondary | ICD-10-CM | POA: Diagnosis not present

## 2023-08-06 ENCOUNTER — Encounter: Payer: Self-pay | Admitting: Sports Medicine

## 2023-08-06 DIAGNOSIS — N401 Enlarged prostate with lower urinary tract symptoms: Secondary | ICD-10-CM

## 2023-08-06 MED ORDER — OXYBUTYNIN CHLORIDE 5 MG PO TABS: 5.0000 mg | ORAL_TABLET | Freq: Every day | ORAL | 3 refills | Status: DC

## 2023-08-09 ENCOUNTER — Telehealth: Payer: Self-pay

## 2023-08-09 NOTE — Telephone Encounter (Signed)
Initiated Prior authorization ONG:[id number] Chloride 5MG  tablets Via: Covermymeds Case/Key:[id number] Status: denied  as of 08/09/23 Reason:submission from another user,There is an existing case within the [facility] environment that has the same patient, prescriber, and drug. This case must be finalized before proceeding with similar requests. Notified Pt via: Mychart

## 2023-08-09 NOTE — Telephone Encounter (Signed)
It certainly can, but I am approving this for this patient, if they refuse to cover it he can just buy it cash with a good Rx coupon, it is fairly cheap, typically about $10 for 70-month supply at most pharmacies.

## 2023-08-10 ENCOUNTER — Telehealth: Payer: Self-pay

## 2023-08-10 NOTE — Telephone Encounter (Signed)
Patient informed and will use good rx coupon.

## 2023-08-10 NOTE — Telephone Encounter (Signed)
Spoke with patient.

## 2023-08-10 NOTE — Telephone Encounter (Signed)
Called patient. Informed that we would reach out to him once Dr. Karie Schwalbe has reviewed the information and if medication changes are made.

## 2023-08-10 NOTE — Telephone Encounter (Signed)
Attempted call to patient. Left a voice mail message requesting a return call.  

## 2023-08-10 NOTE — Telephone Encounter (Signed)
FYI to all, dirt cheap at many local pharmacies with a good Rx coupon that he can download himself.

## 2023-08-10 NOTE — Telephone Encounter (Signed)
Copied from CRM [id number]. Topic: General - Other >> Aug 10, 2023 10:44 AM Donita Brooks wrote: Reason for CRM: pt miss call from Selena Batten, would like for kim to give call back - [phone]

## 2023-08-20 ENCOUNTER — Encounter: Payer: Self-pay | Admitting: Family Medicine

## 2023-08-20 ENCOUNTER — Ambulatory Visit (INDEPENDENT_AMBULATORY_CARE_PROVIDER_SITE_OTHER): Payer: Medicare HMO | Admitting: Family Medicine

## 2023-08-20 VITALS — Ht 67.0 in | Wt 202.0 lb

## 2023-08-20 DIAGNOSIS — Z0000 Encounter for general adult medical examination without abnormal findings: Secondary | ICD-10-CM

## 2023-08-20 NOTE — Progress Notes (Deleted)
 Marland Kitchen

## 2023-08-20 NOTE — Progress Notes (Signed)
 Subjective:   James Frye is a 73 y.o. male who presents for Medicare Annual/Subsequent preventive examination.  Visit Complete: Virtual I connected with  James Frye on 08/20/23 by a audio enabled telemedicine application and verified that I am speaking with the correct person using two identifiers.  Patient Location: Home  Provider Location: Office/Clinic  I discussed the limitations of evaluation and management by telemedicine. The patient expressed understanding and agreed to proceed.  Vital Signs: Because this visit was a virtual/telehealth visit, some criteria may be missing or patient reported. Any vitals not documented were not able to be obtained and vitals that have been documented are patient reported.  Patient Medicare AWV questionnaire was completed by the patient on 08/16/23; I have confirmed that all information answered by patient is correct and no changes since this date.  Cardiac Risk Factors include: advanced age (>36men, >77 women);diabetes mellitus;male gender;hypertension     Objective:    Today's Vitals   08/20/23 1009  Weight: 202 lb (91.6 kg)  Height: 5' 7 (1.702 m)   Body mass index is 31.64 kg/m.     08/20/2023   10:19 AM 01/05/2023    8:02 AM 08/15/2022    9:07 AM 08/12/2021   10:17 AM 10/23/2018    3:19 PM 05/08/2018   10:33 AM 05/17/2016    9:29 PM  Advanced Directives  Does Patient Have a Medical Advance Directive? No No No Yes No No No  Type of Advance Directive    Living will;Healthcare Power of Attorney     Does patient want to make changes to medical advance directive?    No - Patient declined     Copy of Healthcare Power of Attorney in Chart?    No - copy requested     Would patient like information on creating a medical advance directive? No - Patient declined  No - Patient declined  No - Patient declined No - Patient declined No - patient declined information    Current Medications (verified) Outpatient Encounter Medications as  of 08/20/2023  Medication Sig   busPIRone  (BUSPAR ) 5 MG tablet TAKE 1 TO 2 TABLETS(5 TO 10 MG) BY MOUTH TWICE DAILY   calcium  carbonate (OS-CAL) 1250 (500 Ca) MG chewable tablet Chew by mouth 2 (two) times daily with a meal.   Continuous Blood Gluc Sensor (FREESTYLE LIBRE 3 SENSOR) MISC 1 each by Does not apply route in the morning, at noon, and at bedtime. Place 1 sensor on the skin every 14 days. Use to check glucose continuously.   famotidine  (PEPCID ) 20 MG tablet TAKE 1 TABLET BY MOUTH EVERY DAY   hydrOXYzine  (ATARAX ) 50 MG tablet Take 1 tablet (50 mg total) by mouth at bedtime.   omeprazole  (PRILOSEC) 40 MG capsule Take 40 mg by mouth at bedtime.   oxybutynin  (DITROPAN ) 5 MG tablet Take 1 tablet (5 mg total) by mouth at bedtime.   rosuvastatin  (CRESTOR ) 10 MG tablet Take 10 mg by mouth daily. RX from Dr Floy   sitaGLIPtin  (JANUVIA ) 100 MG tablet Take 1 tablet (100 mg total) by mouth daily.   fluticasone  (FLONASE ) 50 MCG/ACT nasal spray Place 2 sprays into both nostrils daily. (Patient not taking: Reported on 08/20/2023)   ipratropium (ATROVENT ) 0.06 % nasal spray Place 2 sprays into both nostrils 3 (three) times daily. (Patient not taking: Reported on 08/20/2023)   primidone  (MYSOLINE ) 50 MG tablet TAKE 2 TABLETS BY MOUTH AT BEDTIME FOR 1 WEEK THEN TAKE 3 TABLETS BY MOUTH  AT BEDTIME FOR 1 WEEK THEN TAKE 4 TABLETS BY MOUTH AT BEDTIME AS NEEDED (Patient not taking: Reported on 08/20/2023)   tadalafil  (CIALIS ) 5 MG tablet Take 1 tablet (5 mg total) by mouth daily. (Patient not taking: Reported on 08/20/2023)   No facility-administered encounter medications on file as of 08/20/2023.    Allergies (verified) Penicillins   History: Past Medical History:  Diagnosis Date   Allergy not known, long ago   Anemia of chronic renal failure, stage 3b (HCC) 08/10/2022   Diabetes mellitus    Hiatal hernia    Hypertension    Small bowel obstruction (HCC)    Past Surgical History:  Procedure Laterality  Date   APPENDECTOMY     CARDIAC CATHETERIZATION     GASTRIC BYPASS     LAPAROSCOPIC LYSIS OF ADHESIONS     Family History  Problem Relation Age of Onset   Cancer Mother    Stroke Father    Heart disease Father    Social History   Socioeconomic History   Marital status: Married    Spouse name: Terry   Number of children: 4   Years of education: 14   Highest education level: Some college, no degree  Occupational History    Comment: works full time  Tobacco Use   Smoking status: Never   Smokeless tobacco: Never  Vaping Use   Vaping status: Never Used  Substance and Sexual Activity   Alcohol use: No   Drug use: No   Sexual activity: Not Currently  Other Topics Concern   Not on file  Social History Narrative   Lives with his wife and step-son. He works two full-time jobs. He enjoys working and doesn't have time for any hobbies.    Social Drivers of Corporate Investment Banker Strain: Low Risk  (08/20/2023)   Overall Financial Resource Strain (CARDIA)    Difficulty of Paying Living Expenses: Not very hard  Food Insecurity: No Food Insecurity (08/20/2023)   Hunger Vital Sign    Worried About Running Out of Food in the Last Year: Never true    Ran Out of Food in the Last Year: Never true  Transportation Needs: No Transportation Needs (08/20/2023)   PRAPARE - Administrator, Civil Service (Medical): No    Lack of Transportation (Non-Medical): No  Physical Activity: Inactive (08/20/2023)   Exercise Vital Sign    Days of Exercise per Week: 0 days    Minutes of Exercise per Session: 0 min  Stress: No Stress Concern Present (08/20/2023)   [employer] of Occupational Health - Occupational Stress Questionnaire    Feeling of Stress : Not at all  Social Connections: Socially Integrated (08/20/2023)   Social Connection and Isolation Panel [NHANES]    Frequency of Communication with Friends and Family: More than three times a week    Frequency of Social Gatherings with  Friends and Family: Once a week    Attends Religious Services: More than 4 times per year    Active Member of [employer] or Organizations: Yes    Attends Banker Meetings: Never    Marital Status: Married    Tobacco Counseling Counseling given: Not Answered   Clinical Intake:  Pre-visit preparation completed: Yes  Pain : No/denies pain     BMI - recorded: 31.6 Nutritional Status: BMI > 30  Obese Nutritional Risks: None Diabetes: Yes CBG done?: No (does not check at home) Did pt. bring in CBG monitor from home?: No  How often do you need to have someone help you when you read instructions, pamphlets, or other written materials from your doctor or pharmacy?: 1 - Never What is the last grade level you completed in school?: 13  Interpreter Needed?: No      Activities of Daily Living    08/20/2023   10:11 AM 08/16/2023    1:43 PM  In your present state of health, do you have any difficulty performing the following activities:  Hearing? 0 0  Vision? 0 0  Difficulty concentrating or making decisions? 0 0  Walking or climbing stairs? 0 0  Dressing or bathing? 0 0  Doing errands, shopping? 0 0  Preparing Food and eating ? N N  Using the Toilet? N N  In the past six months, have you accidently leaked urine? N N  Do you have problems with loss of bowel control? N N  Managing your Medications? N N  Managing your Finances? N N  Housekeeping or managing your Housekeeping? N N    Patient Care Team: Curtis Debby PARAS, MD as PCP - General (Family Medicine) Beverli Cara PARAS, [facility] (Inactive) as Pharmacist (Pharmacist) Saturnino Buerger, Urology Erich Hamilton, Nephrology Dozier, Dr. Diedre Surgeon Barbaraann Kotyk, cardiology   Indicate any recent Medical Services you may have received from other than [facility] providers in the past year (date may be approximate).     Assessment:   This is a routine wellness examination for Malone.  Hearing/Vision screen Hearing  Screening - Comments:: Unable to test, grossly intact.  Vision Screening - Comments:: Unable to test, no issues reported   Goals Addressed             This Visit's Progress    Disease Progression Prevented or Minimized       Trying to change eating habits to get off of medications.  Goal weight 170 pounds.          Depression Screen    08/20/2023   10:21 AM 08/20/2023   10:19 AM 08/15/2022    8:54 AM 07/11/2022   11:07 AM 08/12/2021   10:18 AM 02/23/2021    4:19 PM 01/20/2021   12:00 PM  PHQ 2/9 Scores  PHQ - 2 Score 0 0 0 0 0 0 0  PHQ- 9 Score      6 3    Fall Risk    08/20/2023   10:20 AM 08/16/2023    1:43 PM 04/06/2023    2:05 PM 08/15/2022    8:54 AM 08/11/2022    6:43 PM  Fall Risk   Falls in the past year? 1 1 0 0 0  Number falls in past yr: 0 0 0 0 0  Injury with Fall? 0 0 0 0 0  Risk for fall due to : History of fall(s)  No Fall Risks No Fall Risks   Risk for fall due to: Comment tripped on something, no serious injury      Follow up   Falls evaluation completed Falls evaluation completed     MEDICARE RISK AT HOME: Medicare Risk at Home Any stairs in or around the home?: Yes If so, are there any without handrails?: No Home free of loose throw rugs in walkways, pet beds, electrical cords, etc?: Yes Adequate lighting in your home to reduce risk of falls?: Yes Life alert?: No Use of a cane, walker or w/c?: No Grab bars in the bathroom?: No Shower chair or bench in shower?: No Elevated toilet seat or  a handicapped toilet?: No  TIMED UP AND GO:  Was the test performed?  No    Cognitive Function:        08/20/2023   10:21 AM 08/15/2022    9:10 AM 08/12/2021   10:28 AM  6CIT Screen  What Year? 0 points 0 points 0 points  What month? 0 points 0 points 0 points  What time? 0 points 0 points 0 points  Count back from 20 0 points 0 points 0 points  Months in reverse 0 points 0 points 0 points  Repeat phrase 0 points 0 points 0 points  Total Score 0 points 0  points 0 points    Immunizations Immunization History  Administered Date(s) Administered   Fluad Quad(high Dose 65+) 04/25/2019, 05/05/2020, 07/11/2022   Influenza Split 06/20/2012   Influenza, High Dose Seasonal PF 06/06/2018   Influenza,inj,Quad PF,6+ Mos 07/03/2013, 06/18/2017   Influenza-Unspecified 07/29/2021   PFIZER(Purple Top)SARS-COV-2 Vaccination 10/05/2019, 10/28/2019, 06/14/2020   Pfizer Covid-19 Vaccine Bivalent Booster 23yrs & up 07/22/2021   Pneumococcal Polysaccharide-23 11/07/2012, 06/06/2018   Rsv, Bivalent, Protein Subunit Rsvpref,pf [place]) 08/17/2022   Tdap 08/14/2010, 02/23/2014   Zoster Recombinant(Shingrix ) 02/01/2021, 05/19/2021   Zoster, Live 11/07/2012    TDAP status: Up to date  Flu Vaccine status: Due, Education has been provided regarding the importance of this vaccine. Advised may receive this vaccine at local pharmacy or Health Dept. Aware to provide a copy of the vaccination record if obtained from local pharmacy or Health Dept. Verbalized acceptance and understanding.  Pneumococcal vaccine status: Up to date  Covid-19 vaccine status: Information provided on how to obtain vaccines.   Qualifies for Shingles Vaccine? Yes   Zostavax completed Yes   Shingrix  Completed?: Yes  Screening Tests Health Maintenance  Topic Date Due   Pneumonia Vaccine 59+ Years old (3 of 3 - PCV) 06/07/2019   OPHTHALMOLOGY EXAM  08/25/2022   FOOT EXAM  02/16/2023   COVID-19 Vaccine (5 - 2024-25 season) 04/15/2023   INFLUENZA VACCINE  11/12/2023 (Originally 03/15/2023)   Diabetic kidney evaluation - Urine ACR  10/31/2023   HEMOGLOBIN A1C  01/08/2024   DTaP/Tdap/Td (3 - Td or Tdap) 02/24/2024   Diabetic kidney evaluation - eGFR measurement  07/10/2024   Medicare Annual Wellness (AWV)  08/19/2024   Colonoscopy  08/21/2028   Hepatitis C Screening  Completed   Zoster Vaccines- Shingrix   Completed   HPV VACCINES  Aged Out    Health Maintenance  Health Maintenance  Due  Topic Date Due   Pneumonia Vaccine 64+ Years old (3 of 3 - PCV) 06/07/2019   OPHTHALMOLOGY EXAM  08/25/2022   FOOT EXAM  02/16/2023   COVID-19 Vaccine (5 - 2024-25 season) 04/15/2023    Colorectal cancer screening: Type of screening: Colonoscopy. Completed 08/21/2018. Repeat every 10 years  Lung Cancer Screening: (Low Dose CT Chest recommended if Age 15-80 years, 20 pack-year currently smoking OR have quit w/in 15years.) does not qualify.   Lung Cancer Screening Referral: n/a   Additional Screening:  Hepatitis C Screening: does qualify; Completed 06/27/2016  Vision Screening: Recommended annual ophthalmology exams for early detection of glaucoma and other disorders of the eye. Is the patient up to date with their annual eye exam?  Yes  Who is the provider or what is the name of the office in which the patient attends annual eye exams? [facility] If pt is not established with a provider, would they like to be referred to a provider to establish care? No .  Dental Screening: Recommended annual dental exams for proper oral hygiene  Diabetic Foot Exam: Diabetic Foot Exam: Overdue, Pt has been advised about the importance in completing this exam. Pt is scheduled for diabetic foot exam on needs to schedule with PCP .  Community Resource Referral / Chronic Care Management: CRR required this visit?  No   CCM required this visit?  No     Plan:     I have personally reviewed and noted the following in the patient's chart:   Medical and social history Use of alcohol, tobacco or illicit drugs  Current medications and supplements including opioid prescriptions. Patient is not currently taking opioid prescriptions. Functional ability and status Nutritional status Physical activity Advanced directives List of other physicians Hospitalizations, surgeries, and ER visits in previous 12 months Vitals Screenings to include cognitive, depression, and falls Referrals and  appointments  In addition, I have reviewed and discussed with patient certain preventive protocols, quality metrics, and best practice recommendations. A written personalized care plan for preventive services as well as general preventive health recommendations were provided to patient.     [name], FNP   08/20/2023   After Visit Summary: (MyChart) Due to this being a telephonic visit, the after visit summary with patients personalized plan was offered to patient via MyChart   Follow-up with PCP as scheduled. Influenza and Covid vaccines at local pharmacy.  Foot exam per PCP.

## 2023-08-21 DIAGNOSIS — N400 Enlarged prostate without lower urinary tract symptoms: Secondary | ICD-10-CM | POA: Diagnosis not present

## 2023-08-30 ENCOUNTER — Encounter: Payer: Self-pay | Admitting: Sports Medicine

## 2023-08-30 MED ORDER — BUSPIRONE HCL 5 MG PO TABS: ORAL_TABLET | ORAL | 3 refills | Status: DC

## 2023-09-10 ENCOUNTER — Encounter: Payer: Self-pay | Admitting: Sports Medicine

## 2023-09-12 ENCOUNTER — Encounter: Payer: Self-pay | Admitting: Sports Medicine

## 2023-09-15 LAB — HM DIABETES EYE EXAM

## 2023-10-03 DIAGNOSIS — N400 Enlarged prostate without lower urinary tract symptoms: Secondary | ICD-10-CM | POA: Diagnosis not present

## 2023-10-04 ENCOUNTER — Encounter: Payer: Self-pay | Admitting: Sports Medicine

## 2023-10-04 DIAGNOSIS — F5101 Primary insomnia: Secondary | ICD-10-CM

## 2023-10-04 MED ORDER — HYDROXYZINE HCL 50 MG PO TABS: 50.0000 mg | ORAL_TABLET | Freq: Every day | ORAL | 1 refills | Status: DC

## 2023-10-11 ENCOUNTER — Encounter: Payer: Self-pay | Admitting: Sports Medicine

## 2023-10-11 ENCOUNTER — Ambulatory Visit (INDEPENDENT_AMBULATORY_CARE_PROVIDER_SITE_OTHER): Payer: Medicare HMO | Admitting: Sports Medicine

## 2023-10-11 VITALS — BP 122/70 | HR 70 | Ht 67.0 in | Wt 196.0 lb

## 2023-10-11 DIAGNOSIS — E119 Type 2 diabetes mellitus without complications: Secondary | ICD-10-CM | POA: Diagnosis not present

## 2023-10-11 DIAGNOSIS — F4322 Adjustment disorder with anxiety: Secondary | ICD-10-CM | POA: Diagnosis not present

## 2023-10-11 DIAGNOSIS — R143 Flatulence: Secondary | ICD-10-CM | POA: Insufficient documentation

## 2023-10-11 DIAGNOSIS — R809 Proteinuria, unspecified: Secondary | ICD-10-CM

## 2023-10-11 DIAGNOSIS — E1129 Type 2 diabetes mellitus with other diabetic kidney complication: Secondary | ICD-10-CM

## 2023-10-11 DIAGNOSIS — F4320 Adjustment disorder, unspecified: Secondary | ICD-10-CM | POA: Insufficient documentation

## 2023-10-11 LAB — POCT GLYCOSYLATED HEMOGLOBIN (HGB A1C): Hemoglobin A1C: 6.8 % — AB (ref 4.0–5.6)

## 2023-10-11 LAB — POCT UA - MICROALBUMIN
Creatinine, POC: 300 mg/dL
Microalbumin Ur, POC: 80 mg/L

## 2023-10-11 MED ORDER — SIMETHICONE 180 MG PO CAPS: 1.0000 | ORAL_CAPSULE | Freq: Four times a day (QID) | ORAL | 0 refills | Status: AC | PRN

## 2023-10-11 NOTE — Assessment & Plan Note (Signed)
 James Frye is going through a separation and divorce, he does have significant anxiety, as well as adrenergic symptoms such as shakiness when thinking about it. He will be moving out and he has an apartment. Financially things will be okay. I think is reasonable to get him some cognitive behavioral therapy in the meantime before we consider medication.

## 2023-10-11 NOTE — Assessment & Plan Note (Signed)
 Presyncopal symptoms have improved dramatically since the discontinuance of Jardiance, we did increase his Januvia to 100 mg daily, he did not tolerate metformin in the past. He is doing well today, A1c is 6.8%, he did have some microalbuminuria, he will bring this up to his nephrologist. In the meantime no changes.

## 2023-10-11 NOTE — Progress Notes (Signed)
    Procedures performed today:    None.  Independent interpretation of notes and tests performed by another provider:   None.  Brief History, Exam, Impression, and Recommendations:    Type 2 diabetes mellitus, controlled, with renal complications (HCC) Presyncopal symptoms have improved dramatically since the discontinuance of Jardiance, we did increase his Januvia to 100 mg daily, he did not tolerate metformin in the past. He is doing well today, A1c is 6.8%, he did have some microalbuminuria, he will bring this up to his nephrologist. In the meantime no changes.  Adjustment disorder Judith is going through a separation and divorce, he does have significant anxiety, as well as adrenergic symptoms such as shakiness when thinking about it. He will be moving out and he has an apartment. Financially things will be okay. I think is reasonable to get him some cognitive behavioral therapy in the meantime before we consider medication.  I spent 30 minutes of total time managing this patient today, this includes chart review, face to face, and non-face to face time.  ____________________________________________ [name]. Benjamin Stain, M.D., ABFM., CAQSM., AME. Primary Care and Sports Medicine [facility]  Adjunct Professor of Family Medicine  [employer]  Restaurant manager, fast food

## 2023-10-11 NOTE — Assessment & Plan Note (Signed)
 We will start with simethicone

## 2023-10-18 ENCOUNTER — Encounter: Payer: Self-pay | Admitting: Professional

## 2023-10-18 ENCOUNTER — Ambulatory Visit: Admitting: Professional

## 2023-10-18 DIAGNOSIS — F411 Generalized anxiety disorder: Secondary | ICD-10-CM
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
## 2023-10-18 NOTE — Progress Notes (Signed)
   James Frye, [facility]

## 2023-10-19 ENCOUNTER — Ambulatory Visit (INDEPENDENT_AMBULATORY_CARE_PROVIDER_SITE_OTHER): Admitting: Sports Medicine

## 2023-10-19 ENCOUNTER — Ambulatory Visit

## 2023-10-19 ENCOUNTER — Encounter: Payer: Self-pay | Admitting: Sports Medicine

## 2023-10-19 DIAGNOSIS — M47816 Spondylosis without myelopathy or radiculopathy, lumbar region: Secondary | ICD-10-CM | POA: Diagnosis not present

## 2023-10-19 DIAGNOSIS — M545 Low back pain, unspecified: Secondary | ICD-10-CM

## 2023-10-19 DIAGNOSIS — M549 Dorsalgia, unspecified: Secondary | ICD-10-CM | POA: Diagnosis not present

## 2023-10-19 DIAGNOSIS — S39012A Strain of muscle, fascia and tendon of lower back, initial encounter: Secondary | ICD-10-CM | POA: Diagnosis not present

## 2023-10-19 DIAGNOSIS — M4316 Spondylolisthesis, lumbar region: Secondary | ICD-10-CM | POA: Diagnosis not present

## 2023-10-19 MED ORDER — HYDROCODONE-ACETAMINOPHEN 5-325 MG PO TABS: 1.0000 | ORAL_TABLET | Freq: Three times a day (TID) | ORAL | 0 refills | Status: DC | PRN

## 2023-10-19 MED ORDER — METHOCARBAMOL 500 MG PO TABS: 500.0000 mg | ORAL_TABLET | Freq: Three times a day (TID) | ORAL | 0 refills | Status: AC

## 2023-10-19 MED ORDER — PREDNISONE 50 MG PO TABS: ORAL_TABLET | ORAL | 0 refills | Status: DC

## 2023-10-19 NOTE — Progress Notes (Signed)
    Procedures performed today:    None.  Independent interpretation of notes and tests performed by another provider:   None.  Brief History, Exam, Impression, and Recommendations:    Lumbar strain Very pleasant 73 year old male, who was lifting his TV and felt pain left low back in the quadratus lumborum, nothing radicular, no red flag symptoms for Known lumbar DDD. Adding x-rays, prednisone, hydrocodone, Robaxin at night. Home PT, return to see me in 6 weeks as needed.    ____________________________________________ [name]. Benjamin Stain, M.D., ABFM., CAQSM., AME. Primary Care and Sports Medicine [facility]  Adjunct Professor of Family Medicine  [place] [employer]  Restaurant manager, fast food

## 2023-10-19 NOTE — Assessment & Plan Note (Signed)
 Very pleasant 73 year old male, who was lifting his TV and felt pain left low back in the quadratus lumborum, nothing radicular, no red flag symptoms for Known lumbar DDD. Adding x-rays, prednisone, hydrocodone, Robaxin at night. Home PT, return to see me in 6 weeks as needed.

## 2023-10-22 DIAGNOSIS — D472 Monoclonal gammopathy: Secondary | ICD-10-CM | POA: Diagnosis not present

## 2023-10-22 DIAGNOSIS — N1832 Chronic kidney disease, stage 3b: Secondary | ICD-10-CM | POA: Diagnosis not present

## 2023-10-22 DIAGNOSIS — I10 Essential (primary) hypertension: Secondary | ICD-10-CM | POA: Diagnosis not present

## 2023-10-22 DIAGNOSIS — D631 Anemia in chronic kidney disease: Secondary | ICD-10-CM | POA: Diagnosis not present

## 2023-10-22 DIAGNOSIS — E1122 Type 2 diabetes mellitus with diabetic chronic kidney disease: Secondary | ICD-10-CM | POA: Diagnosis not present

## 2023-10-22 DIAGNOSIS — N183 Chronic kidney disease, stage 3 unspecified: Secondary | ICD-10-CM | POA: Diagnosis not present

## 2023-11-01 ENCOUNTER — Encounter: Payer: Self-pay | Admitting: Professional

## 2023-11-01 ENCOUNTER — Ambulatory Visit (INDEPENDENT_AMBULATORY_CARE_PROVIDER_SITE_OTHER): Admitting: Professional

## 2023-11-01 DIAGNOSIS — F411 Generalized anxiety disorder: Secondary | ICD-10-CM | POA: Diagnosis not present
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
## 2023-11-01 NOTE — Progress Notes (Unsigned)
   Teofilo Pod, [facility]

## 2023-11-01 NOTE — Progress Notes (Deleted)
 [facility] Counselor Initial Adult Exam  Name: James Frye Date: 11/01/2023 MRN: [id number] DOB: 12-Mar-1951 PCP: [name], MD  Time spent: ***  Guardian/Payee:  ***    Paperwork requested: {GEX:52841}  Reason for Visit Loman Chroman Problem: ***  Mental Status Exam: Appearance:   {PSY:22683}     Behavior:  {PSY:21022743}  Motor:  {PSY:22302}  Speech/Language:   {LKG:40102}  Affect:  {PSY:22687}  Mood:  {PSY:31886}  Thought process:  {VOZ:36644}  Thought content:    {[phone]}  Sensory/Perceptual disturbances:    {PSY:215-487-3449}  Orientation:  {PSY:30297}  Attention:  {PSY:22877}  Concentration:  {[phone]}  Memory:  {[phone]}  Fund of knowledge:   {IHK:7425956387}  Insight:    {[phone]}  Judgment:   {[phone]}  Impulse Control:  {[phone]}   Appearance: {PSY:22683}    Behavior: {PSY:21022743} Motor: {PSY:22302} Speech/Language: {PSY:22685} Affect: {PSY:22687} Mood: {PSY:31886} Thought process: {PSY:31888} Thought content: {[phone]} Sensory/Perceptual disturbances: {PSY:215-487-3449} Orientation: {PSY:30297} Attention: {PSY:22877} Concentration: {[phone]} Memory: {[phone] [phone]} Fund of knowledge: {[phone]} Insight: {[phone]} Judgment: {[phone]} Impulse Control: {[phone]}  Reported Symptoms:  ***  Risk Assessment: Danger to Self:  {PSY:22692} Self-injurious Behavior: {PSY:22692} Danger to Others: {PSY:22692} Duty to Warn:{PSY:311194} Physical Aggression / Violence:{PSY:21197} Access to Firearms a concern: {PSY:21197} Gang Involvement:{PSY:21197} Patient / guardian was educated about steps to take if suicide or homicide risk level increases between visits: {Yes/No-Ex:120004} While future psychiatric events cannot be accurately predicted, the patient does not currently require acute inpatient psychiatric care and does not currently meet [facility] involuntary commitment criteria.  Substance Abuse History: Current substance abuse: {PSY:21197}    Past Psychiatric History:   {Past psych history:20559} Outpatient Providers:*** History of Psych Hospitalization: {PSY:21197} Psychological Testing: {PSY:21014032}   Abuse History:  Victim of: {Abuse History:314532}, {Type of abuse:20566}   Report needed: {PSY:314532} Victim of Neglect:{yes no:314532} Perpetrator of {PSY:20566}  Witness / Exposure to Domestic Violence: {PSY:21197}  Protective Services Involvement: {PSY:21197} Witness to [employer] Violence:  {PSY:21197}  Family History:  Family History  Problem Relation Age of Onset   Cancer Mother    Stroke Father    Heart disease Father     Living situation: the patient {lives:315711::"lives with their family"}  Sexual Orientation: {Sexual Orientation:[phone]}  Relationship Status: {Desc; marital status:62}  Name of spouse / other:*** If a parent, number of children / ages:***  Support Systems: {DIABETES SUPPORT:20310}  Financial Stress:  {YES/NO:21197}  Income/Employment/Disability: Manufacturing engineer: [employer]  Educational History: Education: {PSY :31912}  Religion/Sprituality/World View: {CHL AMB RELIGION/SPIRITUALITY:[phone]}  Any cultural differences that may affect / interfere with treatment:  {Religious/Cultural:200019}  Recreation/Hobbies: {Woc hobbies:30428}  Stressors: {PATIENT STRESSORS:22669}  Strengths: {Patient Coping Strengths:[phone]}  Barriers:  ***   Legal History: Pending legal issue / charges: {PSY:20588} History of legal issue / charges: {Legal [phone]}  Medical History/Surgical History: {Desc; reviewed/not reviewed:60074} Past Medical History:  Diagnosis Date   Allergy not known, long ago   Anemia of chronic renal failure, stage 3b (HCC) 08/10/2022   Diabetes mellitus    Hiatal hernia    Hypertension    Small bowel  obstruction (HCC)     Past Surgical History:  Procedure Laterality Date   APPENDECTOMY     CARDIAC CATHETERIZATION     GASTRIC BYPASS     LAPAROSCOPIC LYSIS OF ADHESIONS      Medications: Current Outpatient Medications  Medication Sig Dispense Refill   busPIRone (BUSPAR) 5 MG tablet TAKE 1 TO 2 TABLETS(5 TO 10 MG) BY MOUTH TWICE DAILY 180 tablet 3  calcium carbonate (OS-CAL) 1250 (500 Ca) MG chewable tablet Chew by mouth 2 (two) times daily with a meal.     Continuous Blood Gluc Sensor (FREESTYLE LIBRE 3 SENSOR) MISC 1 each by Does not apply route in the morning, at noon, and at bedtime. Place 1 sensor on the skin every 14 days. Use to check glucose continuously. 2 each 11   famotidine (PEPCID) 20 MG tablet TAKE 1 TABLET BY MOUTH EVERY DAY 90 tablet 1   fluticasone (FLONASE) 50 MCG/ACT nasal spray Place 2 sprays into both nostrils daily. (Patient not taking: Reported on 08/20/2023)     HYDROcodone-acetaminophen (NORCO/VICODIN) 5-325 MG tablet Take 1 tablet by mouth every 8 (eight) hours as needed for moderate pain (pain score 4-6). 15 tablet 0   hydrOXYzine (ATARAX) 50 MG tablet Take 1 tablet (50 mg total) by mouth at bedtime. 90 tablet 1   ipratropium (ATROVENT) 0.06 % nasal spray Place 2 sprays into both nostrils 3 (three) times daily. (Patient not taking: Reported on 08/20/2023) 90 mL 1   methocarbamol (ROBAXIN) 500 MG tablet Take 1 tablet (500 mg total) by mouth 3 (three) times daily. 90 tablet 0   omeprazole (PRILOSEC) 40 MG capsule Take 40 mg by mouth at bedtime.     oxybutynin (DITROPAN) 5 MG tablet Take 1 tablet (5 mg total) by mouth at bedtime. 30 tablet 3   predniSONE (DELTASONE) 50 MG tablet One tab PO daily for 5 days. 5 tablet 0   primidone (MYSOLINE) 50 MG tablet TAKE 2 TABLETS BY MOUTH AT BEDTIME FOR 1 WEEK THEN TAKE 3 TABLETS BY MOUTH AT BEDTIME FOR 1 WEEK THEN TAKE 4 TABLETS BY MOUTH AT BEDTIME AS NEEDED (Patient not taking: Reported on 08/20/2023) 180 tablet 3   rosuvastatin  (CRESTOR) 10 MG tablet Take 10 mg by mouth daily. RX from Dr Haroldine Laws     Simethicone 180 MG CAPS Take 1 capsule (180 mg total) by mouth 4 (four) times daily as needed (gas/flatulance/bloating). 30 capsule 0   sitaGLIPtin (JANUVIA) 100 MG tablet Take 1 tablet (100 mg total) by mouth daily. 30 tablet 11   tadalafil (CIALIS) 5 MG tablet Take 1 tablet (5 mg total) by mouth daily. (Patient not taking: Reported on 08/20/2023)     No current facility-administered medications for this visit.    Allergies  Allergen Reactions   Penicillins Rash    Diagnoses:  No diagnosis found.  Plan of Care: ***   Teofilo Pod, [facility]

## 2023-11-12 ENCOUNTER — Ambulatory Visit: Admitting: Professional

## 2023-11-13 ENCOUNTER — Encounter (INDEPENDENT_AMBULATORY_CARE_PROVIDER_SITE_OTHER): Payer: Self-pay | Admitting: Sports Medicine

## 2023-11-13 DIAGNOSIS — S39012A Strain of muscle, fascia and tendon of lower back, initial encounter: Secondary | ICD-10-CM

## 2023-11-13 DIAGNOSIS — N401 Enlarged prostate with lower urinary tract symptoms: Secondary | ICD-10-CM

## 2023-11-13 MED ORDER — HYDROCODONE-ACETAMINOPHEN 5-325 MG PO TABS: 1.0000 | ORAL_TABLET | Freq: Three times a day (TID) | ORAL | 0 refills | Status: AC | PRN

## 2023-11-13 NOTE — Telephone Encounter (Signed)

## 2023-11-14 MED ORDER — OXYBUTYNIN CHLORIDE 5 MG PO TABS: 5.0000 mg | ORAL_TABLET | Freq: Every day | ORAL | 3 refills | Status: DC

## 2023-11-14 NOTE — Addendum Note (Signed)
 Addended by: Chalmers Cater on: 11/14/2023 10:40 AM   Modules accepted: Orders

## 2023-11-15 ENCOUNTER — Telehealth: Payer: Self-pay

## 2023-11-15 ENCOUNTER — Other Ambulatory Visit (HOSPITAL_COMMUNITY): Payer: Self-pay

## 2023-11-15 NOTE — Telephone Encounter (Signed)
 Pharmacy Patient Advocate Encounter  Received notification from [facility] that Prior Authorization for Oxybutynin 5 has been APPROVED from 08/15/23 to 08/13/24. Ran test claim, Copay is $2.67. This test claim was processed through [facility]- copay amounts may vary at other pharmacies due to pharmacy/plan contracts, or as the patient moves through the different stages of their insurance plan.   PA #/Case ID/Reference #:  [id number]

## 2023-11-15 NOTE — Telephone Encounter (Signed)
 Pharmacy Patient Advocate Encounter   Received notification from Patient Pharmacy that prior authorization for Oxybutynin 5 is required/requested.   Insurance verification completed.   The patient is insured through [employer] .   Per test claim: PA required; PA submitted to above mentioned insurance via CoverMyMeds Key/confirmation #/EOC [id number] Status is pending

## 2023-11-26 ENCOUNTER — Encounter: Payer: Self-pay | Admitting: Professional

## 2023-11-26 ENCOUNTER — Ambulatory Visit (INDEPENDENT_AMBULATORY_CARE_PROVIDER_SITE_OTHER): Admitting: Professional

## 2023-11-26 DIAGNOSIS — F411 Generalized anxiety disorder: Secondary | ICD-10-CM
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
## 2023-12-10 ENCOUNTER — Ambulatory Visit: Admitting: Professional

## 2023-12-24 ENCOUNTER — Ambulatory Visit: Admitting: Professional

## 2024-01-08 ENCOUNTER — Ambulatory Visit (INDEPENDENT_AMBULATORY_CARE_PROVIDER_SITE_OTHER): Payer: Medicare HMO | Admitting: Sports Medicine

## 2024-01-08 VITALS — BP 126/80 | HR 69 | Wt 193.0 lb

## 2024-01-08 DIAGNOSIS — R809 Proteinuria, unspecified: Secondary | ICD-10-CM | POA: Diagnosis not present

## 2024-01-08 DIAGNOSIS — E1129 Type 2 diabetes mellitus with other diabetic kidney complication: Secondary | ICD-10-CM

## 2024-01-08 DIAGNOSIS — E119 Type 2 diabetes mellitus without complications: Secondary | ICD-10-CM | POA: Diagnosis not present

## 2024-01-08 LAB — POCT GLYCOSYLATED HEMOGLOBIN (HGB A1C): HbA1c, POC (controlled diabetic range): 5.9 % (ref 0.0–7.0)

## 2024-01-08 NOTE — Assessment & Plan Note (Addendum)
 Hemoglobin A1c well-controlled at 5.9%. He is off of Jardiance, on Januvia  100 mg daily. Eye exam was normal in February, abstracted. Due to his chronic renal insufficiency nephrology has recommended staying away from metformin  indefinitely. Return to see me in 6 months for repeat A1c.

## 2024-01-08 NOTE — Progress Notes (Signed)
    Procedures performed today:    None.  Independent interpretation of notes and tests performed by another provider:   None.  Brief History, Exam, Impression, and Recommendations:    Type 2 diabetes mellitus, controlled, with renal complications (HCC) Hemoglobin A1c well-controlled at 5.9%. He is off of Jardiance, on Januvia  100 mg daily. Eye exam was normal in February, abstracted. Due to his chronic renal insufficiency nephrology has recommended staying away from metformin  indefinitely. Return to see me in 6 months for repeat A1c.  I spent 30 minutes of total time managing this patient today, this includes chart review, face to face, and non-face to face time.  ____________________________________________ Joselyn Nicely. Sandy Crumb, M.D., ABFM., CAQSM., AME. Primary Care and Sports Medicine [facility]  Adjunct Professor of [facility] Medicine  [employer]  [employer]  Restaurant manager, fast food

## 2024-01-09 ENCOUNTER — Ambulatory Visit: Admitting: Professional

## 2024-01-10 ENCOUNTER — Other Ambulatory Visit: Payer: Self-pay

## 2024-01-10 ENCOUNTER — Other Ambulatory Visit: Payer: Self-pay | Admitting: Sports Medicine

## 2024-01-10 DIAGNOSIS — G250 Essential tremor: Secondary | ICD-10-CM

## 2024-01-10 MED ORDER — PRIMIDONE 50 MG PO TABS: ORAL_TABLET | ORAL | 3 refills | Status: DC

## 2024-01-21 ENCOUNTER — Ambulatory Visit: Admitting: Professional

## 2024-01-25 NOTE — Telephone Encounter (Signed)
 James Frye

## 2024-02-04 ENCOUNTER — Ambulatory Visit: Admitting: Professional

## 2024-02-04 ENCOUNTER — Other Ambulatory Visit: Payer: Self-pay

## 2024-02-05 ENCOUNTER — Other Ambulatory Visit: Payer: Self-pay

## 2024-02-05 MED ORDER — BUSPIRONE HCL 5 MG PO TABS: ORAL_TABLET | ORAL | 3 refills | Status: DC

## 2024-02-12 ENCOUNTER — Other Ambulatory Visit: Payer: Self-pay

## 2024-02-12 DIAGNOSIS — N401 Enlarged prostate with lower urinary tract symptoms: Secondary | ICD-10-CM

## 2024-02-12 MED ORDER — OXYBUTYNIN CHLORIDE 5 MG PO TABS: 5.0000 mg | ORAL_TABLET | Freq: Every day | ORAL | 3 refills | Status: AC

## 2024-03-03 ENCOUNTER — Encounter (INDEPENDENT_AMBULATORY_CARE_PROVIDER_SITE_OTHER): Payer: Self-pay | Admitting: Sports Medicine

## 2024-03-03 DIAGNOSIS — N401 Enlarged prostate with lower urinary tract symptoms: Secondary | ICD-10-CM

## 2024-03-03 MED ORDER — SILODOSIN 4 MG PO CAPS: 4.0000 mg | ORAL_CAPSULE | Freq: Every day | ORAL | 3 refills | Status: AC

## 2024-03-03 NOTE — Telephone Encounter (Signed)

## 2024-03-06 ENCOUNTER — Encounter: Payer: Self-pay | Admitting: Sports Medicine

## 2024-03-11 ENCOUNTER — Other Ambulatory Visit: Payer: Self-pay

## 2024-03-11 DIAGNOSIS — E119 Type 2 diabetes mellitus without complications: Secondary | ICD-10-CM

## 2024-03-11 MED ORDER — SITAGLIPTIN PHOSPHATE 100 MG PO TABS
100.0000 mg | ORAL_TABLET | Freq: Every day | ORAL | 11 refills | Status: AC
Start: 2024-03-11 — End: ?

## 2024-04-01 ENCOUNTER — Other Ambulatory Visit: Payer: Self-pay

## 2024-04-01 DIAGNOSIS — F5101 Primary insomnia: Secondary | ICD-10-CM

## 2024-04-01 MED ORDER — HYDROXYZINE HCL 50 MG PO TABS: 50.0000 mg | ORAL_TABLET | Freq: Every day | ORAL | 1 refills | Status: DC

## 2024-04-15 ENCOUNTER — Encounter: Payer: Self-pay | Admitting: Sports Medicine

## 2024-04-18 DIAGNOSIS — I10 Essential (primary) hypertension: Secondary | ICD-10-CM | POA: Diagnosis not present

## 2024-04-23 DIAGNOSIS — N1832 Chronic kidney disease, stage 3b: Secondary | ICD-10-CM | POA: Diagnosis not present

## 2024-04-23 DIAGNOSIS — N183 Chronic kidney disease, stage 3 unspecified: Secondary | ICD-10-CM | POA: Diagnosis not present

## 2024-04-23 DIAGNOSIS — D631 Anemia in chronic kidney disease: Secondary | ICD-10-CM | POA: Diagnosis not present

## 2024-04-23 DIAGNOSIS — I10 Essential (primary) hypertension: Secondary | ICD-10-CM | POA: Diagnosis not present

## 2024-04-23 DIAGNOSIS — D472 Monoclonal gammopathy: Secondary | ICD-10-CM | POA: Diagnosis not present

## 2024-04-28 DIAGNOSIS — Z8673 Personal history of transient ischemic attack (TIA), and cerebral infarction without residual deficits: Secondary | ICD-10-CM | POA: Diagnosis not present

## 2024-04-28 DIAGNOSIS — N1832 Chronic kidney disease, stage 3b: Secondary | ICD-10-CM | POA: Diagnosis not present

## 2024-04-28 DIAGNOSIS — Z0000 Encounter for general adult medical examination without abnormal findings: Secondary | ICD-10-CM | POA: Diagnosis not present

## 2024-04-28 DIAGNOSIS — I509 Heart failure, unspecified: Secondary | ICD-10-CM | POA: Diagnosis not present

## 2024-04-28 DIAGNOSIS — I130 Hypertensive heart and chronic kidney disease with heart failure and stage 1 through stage 4 chronic kidney disease, or unspecified chronic kidney disease: Secondary | ICD-10-CM | POA: Diagnosis not present

## 2024-04-28 DIAGNOSIS — Z7984 Long term (current) use of oral hypoglycemic drugs: Secondary | ICD-10-CM | POA: Diagnosis not present

## 2024-04-28 DIAGNOSIS — E1122 Type 2 diabetes mellitus with diabetic chronic kidney disease: Secondary | ICD-10-CM | POA: Diagnosis not present

## 2024-04-29 ENCOUNTER — Other Ambulatory Visit (HOSPITAL_BASED_OUTPATIENT_CLINIC_OR_DEPARTMENT_OTHER): Payer: Self-pay

## 2024-04-29 ENCOUNTER — Ambulatory Visit (INDEPENDENT_AMBULATORY_CARE_PROVIDER_SITE_OTHER): Admitting: Medical

## 2024-04-29 ENCOUNTER — Ambulatory Visit: Payer: Self-pay | Admitting: Medical

## 2024-04-29 ENCOUNTER — Encounter: Payer: Self-pay | Admitting: Medical

## 2024-04-29 ENCOUNTER — Ambulatory Visit: Payer: Self-pay

## 2024-04-29 VITALS — BP 122/80 | HR 70 | Temp 97.6°F | Resp 15 | Ht 67.0 in | Wt 193.2 lb

## 2024-04-29 DIAGNOSIS — J029 Acute pharyngitis, unspecified: Secondary | ICD-10-CM | POA: Diagnosis not present

## 2024-04-29 DIAGNOSIS — E875 Hyperkalemia: Secondary | ICD-10-CM | POA: Diagnosis not present

## 2024-04-29 DIAGNOSIS — R944 Abnormal results of kidney function studies: Secondary | ICD-10-CM | POA: Diagnosis not present

## 2024-04-29 DIAGNOSIS — R051 Acute cough: Secondary | ICD-10-CM

## 2024-04-29 DIAGNOSIS — U071 COVID-19: Secondary | ICD-10-CM

## 2024-04-29 DIAGNOSIS — G5602 Carpal tunnel syndrome, left upper limb: Secondary | ICD-10-CM | POA: Diagnosis not present

## 2024-04-29 DIAGNOSIS — M79602 Pain in left arm: Secondary | ICD-10-CM | POA: Diagnosis not present

## 2024-04-29 LAB — TROPONIN I (HIGH SENSITIVITY): High Sens Troponin I: 6 ng/L (ref 2–17)

## 2024-04-29 LAB — POCT INFLUENZA A/B
Influenza A, POC: NEGATIVE
Influenza B, POC: NEGATIVE

## 2024-04-29 LAB — POC COVID19 BINAXNOW: SARS Coronavirus 2 Ag: POSITIVE — AB

## 2024-04-29 LAB — POCT RAPID STREP A (OFFICE): Rapid Strep A Screen: NEGATIVE

## 2024-04-29 MED ORDER — MOLNUPIRAVIR EUA 200MG CAPSULE: 4.0000 | ORAL_CAPSULE | Freq: Two times a day (BID) | ORAL | 0 refills | Status: DC

## 2024-04-29 MED ORDER — FLUTICASONE PROPIONATE 50 MCG/ACT NA SUSP: 2.0000 | Freq: Every day | NASAL | 1 refills | Status: AC

## 2024-04-29 MED ORDER — BENZONATATE 100 MG PO CAPS: 100.0000 mg | ORAL_CAPSULE | Freq: Three times a day (TID) | ORAL | 0 refills | Status: AC | PRN

## 2024-04-29 NOTE — Patient Instructions (Addendum)
 COVID-19 infection with acute upper respiratory symptoms Acute upper respiratory symptoms with decreased kidney function. Molnupiravir  selected due to potential drug interactions of paxlovid  with his meds and decreased GFR. - Prescribe molnupiravir . - Order metabolic panel. - Prescribe Flonase . - Prescribe benzonatate . - Advise to notify if symptoms worsen.   Left arm pain. Evaluated due to coronary artery disease history. EKG showed no ischemic changes. No changs compared to June 2024 EKG. - Perform EKG. - Order stat troponin.(if elevated then be seen in ED) -if symptoms change or worsen then be seen in ED as well. -pain present for one week with hand tingle. Waking up at night more along line of Carpel tunnel syndrome.  Chronic kidney disease Decreased kidney function critical for antiviral dosing. - Order metabolic panel.   Probable carpal tunnel syndrome, left hand Intermittent tingling in left hand suggestive of carpal tunnel syndrome. -can use wrist cock up splint avoid any nsaids  Follow up one week with pcp or sooner  if needed

## 2024-04-29 NOTE — Progress Notes (Signed)
 Subjective:    Patient ID: James Frye, male    DOB: 1950-12-26, 73 y.o.   MRN: [id number]  HPI  James Frye is a 73 year old male with coronary artery disease who presents with symptoms of a respiratory infection and left hand tingling.  He has been experiencing symptoms of a respiratory infection since Sunday, including fever, sneezing, cough, and nasal congestion. The nasal congestion is particularly bothersome at night. He has been using Nyquil and Mucinex  to manage these symptoms, though he is unsure of their effectiveness. The patient reports that the cough is sometimes productive with mucus, but mostly dry, and there is no chest congestion. He experiences chills and has had to turn his heat up to 85 degrees to feel warm. No muscle aches.  He reports tingling in his left hand, which has been occurring on and off for about a week. The tingling often wakes him up at night, and he has to flex his hand to alleviate it. No recent repetitive activities that could have caused this. No neck pain, chest pain, or shoulder pain. The tingling in his left hand sometimes extends to his arm, causing discomfort down to the elbow. The pain does not worsen with arm movement but can be nagging, making it difficult to lay on his left side.  He has a history of COVID-19 infection and believes his current symptoms started on Sunday. He attempted to obtain antiviral medication but was unable to get a prescription. He has previously managed COVID-19 by staying home and resting.  His current medications include Nyquil and Mucinex  for symptom management and Crestor  for cholesterol management.      Review of Systems  Constitutional:  Positive for chills and fever.  HENT:  Positive for congestion.   Respiratory:  Positive for cough. Negative for wheezing.   Cardiovascular:  Negative for chest pain and palpitations.  Gastrointestinal:  Negative for abdominal pain.  Genitourinary:  Negative for  dysuria.  Musculoskeletal:  Negative for back pain and myalgias.  Skin:  Negative for rash.  Neurological:  Negative for dizziness, seizures, weakness and headaches.  Hematological:  Negative for adenopathy. Does not bruise/bleed easily.  Psychiatric/Behavioral:  Negative for behavioral problems and decreased concentration.    Past Medical History:  Diagnosis Date   Allergy not known, long ago   Anemia of chronic renal failure, stage 3b (HCC) 08/10/2022   Diabetes mellitus    Hiatal hernia    Hypertension    Small bowel obstruction (HCC)      Social History   Socioeconomic History   Marital status: Married    Spouse name: Terry   Number of children: 4   Years of education: 14   Highest education level: Some college, no degree  Occupational History    Comment: works full time  Tobacco Use   Smoking status: Never   Smokeless tobacco: Never  Vaping Use   Vaping status: Never Used  Substance and Sexual Activity   Alcohol use: No   Drug use: No   Sexual activity: Not Currently  Other Topics Concern   Not on file  Social History Narrative   Lives with his wife and step-son. He works two full-time jobs. He enjoys working and doesn't have time for any hobbies.    Social Drivers of Corporate investment banker Strain: Low Risk  (04/29/2024)   Overall Financial Resource Strain (CARDIA)    Difficulty of Paying Living Expenses: Not hard at all  Food  Insecurity: No Food Insecurity (04/29/2024)   Hunger Vital Sign    Worried About Running Out of Food in the Last Year: Never true    Ran Out of Food in the Last Year: Never true  Transportation Needs: No Transportation Needs (04/29/2024)   PRAPARE - Administrator, Civil Service (Medical): No    Lack of Transportation (Non-Medical): No  Physical Activity: Inactive (04/29/2024)   Exercise Vital Sign    Days of Exercise per Week: 0 days    Minutes of Exercise per Session: Not on file  Stress: No Stress Concern Present  (04/29/2024)   [employer] of Occupational Health - Occupational Stress Questionnaire    Feeling of Stress: Not at all  Social Connections: Moderately Integrated (04/29/2024)   Social Connection and Isolation Panel    Frequency of Communication with Friends and Family: More than three times a week    Frequency of Social Gatherings with Friends and Family: Never    Attends Religious Services: More than 4 times per year    Active Member of [employer] or Organizations: Yes    Attends Banker Meetings: 1 to 4 times per year    Marital Status: Divorced  Intimate Partner Violence: Not At Risk (04/15/2024)   Received from [facility]   HITS    Over the last 12 months how often did your partner physically hurt you?: Never    Over the last 12 months how often did your partner insult you or talk down to you?: Sometimes    Over the last 12 months how often did your partner threaten you with physical harm?: Never    Over the last 12 months how often did your partner scream or curse at you?: Sometimes    Past Surgical History:  Procedure Laterality Date   APPENDECTOMY     CARDIAC CATHETERIZATION     GASTRIC BYPASS     LAPAROSCOPIC LYSIS OF ADHESIONS      Family History  Problem Relation Age of Onset   Cancer Mother    Stroke Father    Heart disease Father     Allergies  Allergen Reactions   Penicillins Rash    Current Outpatient Medications on File Prior to Visit  Medication Sig Dispense Refill   busPIRone  (BUSPAR ) 5 MG tablet TAKE 1 TO 2 TABLETS(5 TO 10 MG) BY MOUTH TWICE DAILY 180 tablet 3   calcium  carbonate (OS-CAL) 1250 (500 Ca) MG chewable tablet Chew by mouth 2 (two) times daily with a meal.     Continuous Blood Gluc Sensor (FREESTYLE LIBRE 3 SENSOR) MISC 1 each by Does not apply route in the morning, at noon, and at bedtime. Place 1 sensor on the skin every 14 days. Use to check glucose continuously. 2 each 11   famotidine  (PEPCID ) 20 MG tablet TAKE 1 TABLET BY  MOUTH EVERY DAY 90 tablet 1   HYDROcodone -acetaminophen  (NORCO/VICODIN) 5-325 MG tablet Take 1 tablet by mouth every 8 (eight) hours as needed for moderate pain (pain score 4-6). 30 tablet 0   hydrOXYzine  (ATARAX ) 50 MG tablet Take 1 tablet (50 mg total) by mouth at bedtime. 90 tablet 1   methocarbamol  (ROBAXIN ) 500 MG tablet Take 1 tablet (500 mg total) by mouth 3 (three) times daily. 90 tablet 0   MYRBETRIQ  25 MG TB24 tablet Take 25 mg by mouth daily.     omeprazole  (PRILOSEC) 40 MG capsule Take 40 mg by mouth at bedtime.     oxybutynin  (DITROPAN )  5 MG tablet Take 1 tablet (5 mg total) by mouth at bedtime. 90 tablet 3   primidone  (MYSOLINE ) 50 MG tablet TAKE 2 TABLETS BY MOUTH AT BEDTIME FOR 1 WEEK THEN TAKE 3 TABLETS BY MOUTH AT BEDTIME FOR 1 WEEK THEN TAKE 4 TABLETS BY MOUTH AT BEDTIME AS NEEDED 180 tablet 3   rosuvastatin  (CRESTOR ) 10 MG tablet Take 10 mg by mouth daily. RX from Dr Floy     silodosin  (RAPAFLO ) 4 MG CAPS capsule Take 1 capsule (4 mg total) by mouth daily with breakfast. 90 capsule 3   Simethicone  180 MG CAPS Take 1 capsule (180 mg total) by mouth 4 (four) times daily as needed (gas/flatulance/bloating). 30 capsule 0   sitaGLIPtin  (JANUVIA ) 100 MG tablet Take 1 tablet (100 mg total) by mouth daily. 30 tablet 11   tadalafil  (CIALIS ) 5 MG tablet Take 5 mg by mouth daily.     fluticasone  (FLONASE ) 50 MCG/ACT nasal spray Place 2 sprays into both nostrils daily. (Patient not taking: Reported on 04/29/2024)     ipratropium (ATROVENT ) 0.06 % nasal spray Place 2 sprays into both nostrils 3 (three) times daily. (Patient not taking: Reported on 04/29/2024) 90 mL 1   losartan (COZAAR) 25 MG tablet Take 25 mg by mouth daily.     No current facility-administered medications on file prior to visit.    BP 122/80   Pulse 70   Temp 97.6 F (36.4 C) (Oral)   Resp 15   Ht 5' 7 (1.702 m)   Wt 193 lb 3.2 oz (87.6 kg)   SpO2 98%   BMI 30.26 kg/m        Objective:   Physical  Exam  General Mental Status- Alert. General Appearance- Not in acute distress.   Skin General: Color- Normal Color. Moisture- Normal Moisture.  Neck Carotid Arteries- Normal color. Moisture- Normal Moisture. No carotid bruits. No JVD.  Chest and Lung Exam Auscultation: Breath Sounds:-CTA  Cardiovascular Auscultation:Rythm- RRR Murmurs & Other Heart Sounds:Auscultation of the heart reveals- No Murmurs.  Abdomen Inspection:-Inspeection Normal. Palpation/Percussion:Note:No mass. Palpation and Percussion of the abdomen reveal- Non Tender, Non Distended + BS, no rebound or guarding.    Neurologic Cranial Nerve exam:- CN III-XII intact(No nystagmus), symmetric smile. Strength:- 5/5 equal and symmetric strength both upper and lower extremities.   Heent- no sinus pressure. Does sound nasal congested. Ears-tms normal. Posteior pharynx normal.    Left upper ext- no worsening tingling on wrist manipulation/negative phalens sign. On palpation of ext no pain. No pain on rom.     Assessment & Plan:   Patient Instructions  COVID-19 infection with acute upper respiratory symptoms Acute upper respiratory symptoms with decreased kidney function. Molnupiravir  selected due to potential drug interactions of paxlovid  with his meds and decreased GFR. - Prescribe molnupiravir . - Order metabolic panel. - Prescribe Flonase . - Prescribe benzonatate . - Advise to notify if symptoms worsen.   Left arm pain. Evaluated due to coronary artery disease history. EKG showed no ischemic changes. No changs compared to June 2024 EKG. - Perform EKG. - Order stat troponin.(if elevated then be seen in ED) -if symptoms change or worsen then be seen in ED as well. -pain present for one week with hand tingle. Waking up at night more along line of Carpel tunnel syndrome.  Chronic kidney disease Decreased kidney function critical for antiviral dosing. - Order metabolic panel.   Probable carpal tunnel  syndrome, left hand Intermittent tingling in left hand suggestive of carpal tunnel syndrome. -can  use wrist cock up splint avoid any nsaids  Follow up one week with pcp or sooner  if needed       I personally spent a total of in the care of the patient today including performing a medically appropriate exam/evaluation, counseling and educating, placing orders, referring and communicating with other health care professionals, and documenting clinical information in the EHR. Extra time taken talking with pharmacist on med interaction with paxlovid .   [phone].

## 2024-04-29 NOTE — Telephone Encounter (Signed)
 Patient seen today at [facility] primary care

## 2024-04-29 NOTE — Telephone Encounter (Signed)
 Copied from CRM [id number]. Topic: Clinical - Red Word Triage >> Apr 29, 2024  9:00 AM James Frye wrote: [facility] that prompted transfer to Nurse Triage:   Concern: Productive cough, discolored mucous (yellow)    Symptoms: sinus pressure, headaches, congestion, left arm tingling and soreness, sneezing, body chills, trouble sleeping   When did the symptoms start?: 04/27/2024   What have you done to aid in the concern ? Have you taken anything to assist with the matter?: Yes    If so, what did you take?: Nyquil, zzquil, mucinex  Reason for Disposition  [1] MODERATE pain (e.g., interferes with normal activities) AND [2] present > 3 days  Cough has been present for > 3 weeks  Answer Assessment - Initial Assessment Questions 1. ONSET: When did the cough begin?      Sunday 2. SEVERITY: How bad is the cough today?      moderate 3. SPUTUM: Describe the color of your sputum (e.g., none, dry cough; clear, white, yellow, green)     yellow 4. HEMOPTYSIS: Are you coughing up any blood? If Yes, ask: How much? (e.g., flecks, streaks, tablespoons, etc.)     no 5. DIFFICULTY BREATHING: Are you having difficulty breathing? If Yes, ask: How bad is it? (e.g., mild, moderate, severe)      no 6. FEVER: Do you have a fever? If Yes, ask: What is your temperature, how was it measured, and when did it start?     Yes, chills, body aches 7. CARDIAC HISTORY: Do you have any history of heart disease? (e.g., heart attack, congestive heart failure)      HTN, heart attack 8. LUNG HISTORY: Do you have any history of lung disease?  (e.g., pulmonary embolus, asthma, emphysema)     no 9. PE RISK FACTORS: Do you have a history of blood clots? (or: recent major surgery, recent prolonged travel, bedridden)     na 10. OTHER SYMPTOMS: Do you have any other symptoms? (e.g., runny nose, wheezing, chest pain)       Wheezing, headaches, sinus pressure, sneezing, runny/stuffy 11. PREGNANCY: Is  there any chance you are pregnant? When was your last menstrual period?       na 12. TRAVEL: Have you traveled out of the country in the last month? (e.g., travel history, exposures)       No   Not able to rest due to coughing all night long  Answer Assessment - Initial Assessment Questions 1. ONSET: When did the pain start?     1.5  2. LOCATION: Where is the pain located?     Left arm 8/10 hand to elbow 3. PAIN: How bad is the pain? (Scale 0-10; or none, mild, moderate, severe)     8/10 4. WORK OR EXERCISE: Has there been any recent work or exercise that involved this part of the body?     no 5. CAUSE: What do you think is causing the arm pain?     unknown 6. OTHER SYMPTOMS: Do you have any other symptoms? (e.g., neck pain, swelling, rash, fever, numbness, weakness)     Numbness and tingling 7. PREGNANCY: Is there any chance you are pregnant? When was your last menstrual period?     no  Protocols used: Cough - Acute Productive-A-AH, Arm Pain-A-AH

## 2024-04-30 ENCOUNTER — Other Ambulatory Visit (HOSPITAL_BASED_OUTPATIENT_CLINIC_OR_DEPARTMENT_OTHER): Payer: Self-pay

## 2024-04-30 MED ORDER — MOLNUPIRAVIR EUA 200MG CAPSULE
4.0000 | ORAL_CAPSULE | Freq: Two times a day (BID) | ORAL | 0 refills | Status: AC
  Filled 2024-04-30: qty 40, 5d supply, fill #0

## 2024-04-30 NOTE — Addendum Note (Signed)
 Addended by: DORINA DALLAS HERO on: 04/30/2024 08:07 AM   Modules accepted: Orders

## 2024-05-02 ENCOUNTER — Other Ambulatory Visit (HOSPITAL_BASED_OUTPATIENT_CLINIC_OR_DEPARTMENT_OTHER): Payer: Self-pay

## 2024-05-02 MED ORDER — SODIUM POLYSTYRENE SULFONATE 15 GM/60ML CO SUSP
15.0000 g | Freq: Every day | 0 refills | Status: AC
  Filled 2024-05-02: qty 240, 4d supply, fill #0

## 2024-05-02 NOTE — Addendum Note (Signed)
 Addended by: DORINA DALLAS HERO on: 05/02/2024 05:09 PM   Modules accepted: Orders

## 2024-05-02 NOTE — Addendum Note (Signed)
 Addended by: DORINA DALLAS HERO on: 05/02/2024 05:10 PM   Modules accepted: Orders

## 2024-05-03 LAB — COMPREHENSIVE METABOLIC PANEL WITH GFR
ALT: 22 U/L (ref 0–53)
AST: 23 U/L (ref 0–37)
Albumin: 4.2 g/dL (ref 3.5–5.2)
Alkaline Phosphatase: 71 U/L (ref 39–117)
BUN: 24 mg/dL — ABNORMAL HIGH (ref 6–23)
CO2: 28 meq/L (ref 19–32)
Calcium: 9 mg/dL (ref 8.4–10.5)
Chloride: 105 meq/L (ref 96–112)
Creatinine, Ser: 1.88 mg/dL — ABNORMAL HIGH (ref 0.40–1.50)
GFR: 35.48 mL/min — ABNORMAL LOW (ref >60.00)
Glucose, Bld: 79 mg/dL (ref 70–99)
Potassium: 5.4 meq/L — ABNORMAL HIGH (ref 3.5–5.1)
Sodium: 136 meq/L (ref 135–145)
Total Bilirubin: 0.3 mg/dL (ref 0.2–1.2)
Total Protein: 7.4 g/dL (ref 6.0–8.3)

## 2024-05-09 ENCOUNTER — Other Ambulatory Visit

## 2024-05-09 DIAGNOSIS — E875 Hyperkalemia: Secondary | ICD-10-CM

## 2024-05-09 LAB — COMPREHENSIVE METABOLIC PANEL WITH GFR
ALT: 35 U/L (ref 0–53)
AST: 31 U/L (ref 0–37)
Albumin: 4.4 g/dL (ref 3.5–5.2)
Alkaline Phosphatase: 67 U/L (ref 39–117)
BUN: 29 mg/dL — ABNORMAL HIGH (ref 6–23)
CO2: 28 meq/L (ref 19–32)
Calcium: 9 mg/dL (ref 8.4–10.5)
Chloride: 104 meq/L (ref 96–112)
Creatinine, Ser: 1.98 mg/dL — ABNORMAL HIGH (ref 0.40–1.50)
GFR: 33.33 mL/min — ABNORMAL LOW (ref >60.00)
Glucose, Bld: 125 mg/dL — ABNORMAL HIGH (ref 70–99)
Potassium: 4.2 meq/L (ref 3.5–5.1)
Sodium: 139 meq/L (ref 135–145)
Total Bilirubin: 0.4 mg/dL (ref 0.2–1.2)
Total Protein: 7.1 g/dL (ref 6.0–8.3)

## 2024-05-12 NOTE — Addendum Note (Signed)
 Addended by: DORINA DALLAS HERO on: 05/12/2024 04:39 PM   Modules accepted: Orders

## 2024-05-16 NOTE — Progress Notes (Signed)
 James Frye                                          MRN: [id number]   05/16/2024   The [facility] Quality Team Specialist reviewed this patient medical record for the purposes of chart review for care gap closure. The following were reviewed: abstraction for care gap closure-kidney health evaluation for diabetes:eGFR  and uACR.    [facility] Quality Team

## 2024-05-30 ENCOUNTER — Other Ambulatory Visit: Payer: Self-pay | Admitting: Sports Medicine

## 2024-05-30 DIAGNOSIS — N401 Enlarged prostate with lower urinary tract symptoms: Secondary | ICD-10-CM

## 2024-05-30 DIAGNOSIS — E119 Type 2 diabetes mellitus without complications: Secondary | ICD-10-CM

## 2024-05-30 DIAGNOSIS — G250 Essential tremor: Secondary | ICD-10-CM

## 2024-05-30 DIAGNOSIS — F5101 Primary insomnia: Secondary | ICD-10-CM

## 2024-05-30 MED ORDER — BUSPIRONE HCL 5 MG PO TABS: ORAL_TABLET | ORAL | 0 refills | Status: DC

## 2024-05-30 MED ORDER — FAMOTIDINE 20 MG PO TABS: 20.0000 mg | ORAL_TABLET | Freq: Every day | ORAL | 0 refills | Status: DC

## 2024-05-30 MED ORDER — PRIMIDONE 50 MG PO TABS: ORAL_TABLET | ORAL | 0 refills | Status: DC

## 2024-05-30 NOTE — Telephone Encounter (Signed)
 Copied from CRM [id number]. Topic: Clinical - Medication Refill >> May 30, 2024 10:25 AM Mia F wrote: Medication: famotidine  (PEPCID ) 20 MG tablet  primidone  (MYSOLINE ) 50 MG tablet  busPIRone  (BUSPAR ) 5 MG tablet  silodosin  (RAPAFLO ) 4 MG CAPS capsule  sitaGLIPtin  (JANUVIA ) 100 MG tablet  hydrOXYzine  (ATARAX ) 50 MG tablet  oxybutynin  (DITROPAN ) 5 MG tablet   Has the patient contacted their pharmacy? Yes (Agent: If no, request that the patient contact the pharmacy for the refill. If patient does not wish to contact the pharmacy document the reason why and proceed with request.) (Agent: If yes, when and what did the pharmacy advise?)  This is the patient's preferred pharmacy:  [facility] [address]  [place], [place]  [address] [phone] (P) [phone] (F)   Is this the correct pharmacy for this prescription? Yes If no, delete pharmacy and type the correct one.   Has the prescription been filled recently? No  Is the patient out of the medication? Yes  Has the patient been seen for an appointment in the last year OR does the patient have an upcoming appointment? Yes  Can we respond through MyChart? No  Agent: Please be advised that Rx refills may take up to 3 business days. We ask that you follow-up with your pharmacy.

## 2024-06-04 ENCOUNTER — Telehealth: Payer: Self-pay

## 2024-06-04 NOTE — Telephone Encounter (Signed)
 Copied from CRM #[id number]. Topic: Appointments - Transfer of Care >> Jun 04, 2024  3:42 PM Hadassah Frye wrote: Pt is requesting to transfer FROM: James Debby PARAS, MD Pt is requesting to transfer TO:  [name], FNP Reason for requested transfer: prior PCP has left office It is the responsibility of the team the patient would like to transfer to (Dr.  [name], FNP) to reach out to the patient if for any reason this transfer is not acceptable.

## 2024-06-04 NOTE — Telephone Encounter (Signed)
 Would you please assist this patient in [place] [facility] top Darice Brownie?

## 2024-06-24 ENCOUNTER — Encounter: Payer: Self-pay | Admitting: Family Medicine

## 2024-06-24 ENCOUNTER — Ambulatory Visit (INDEPENDENT_AMBULATORY_CARE_PROVIDER_SITE_OTHER): Admitting: Family Medicine

## 2024-06-24 VITALS — BP 129/76 | HR 77 | Temp 97.5°F | Ht 67.0 in | Wt 191.8 lb

## 2024-06-24 DIAGNOSIS — F411 Generalized anxiety disorder: Secondary | ICD-10-CM

## 2024-06-24 DIAGNOSIS — G250 Essential tremor: Secondary | ICD-10-CM

## 2024-06-24 DIAGNOSIS — E1129 Type 2 diabetes mellitus with other diabetic kidney complication: Secondary | ICD-10-CM | POA: Diagnosis not present

## 2024-06-24 DIAGNOSIS — N401 Enlarged prostate with lower urinary tract symptoms: Secondary | ICD-10-CM

## 2024-06-24 DIAGNOSIS — I10 Essential (primary) hypertension: Secondary | ICD-10-CM | POA: Diagnosis not present

## 2024-06-24 DIAGNOSIS — E785 Hyperlipidemia, unspecified: Secondary | ICD-10-CM | POA: Insufficient documentation

## 2024-06-24 DIAGNOSIS — Z7984 Long term (current) use of oral hypoglycemic drugs: Secondary | ICD-10-CM

## 2024-06-24 MED ORDER — LOSARTAN POTASSIUM 25 MG PO TABS: 25.0000 mg | ORAL_TABLET | Freq: Every day | ORAL | 1 refills | Status: AC

## 2024-06-24 MED ORDER — ROSUVASTATIN CALCIUM 10 MG PO TABS: 10.0000 mg | ORAL_TABLET | Freq: Every day | ORAL | 1 refills | Status: AC

## 2024-06-24 NOTE — Progress Notes (Signed)
 Established Patient Office Visit  Subjective   Patient ID: James Frye, male    DOB: 1951/07/06  Age: 73 y.o. MRN: [id number]  Chief Complaint  Patient presents with   Transitions Of Care  Establishing care with this practice. He will see Dr. Colette for ongoing care for chronic conditions listed below.   HPI GAD: buspirone  5 mg BID, hydroxyzine  50 mg HS Symptoms are well controlled.  GAD-7: 1  GERD: famotidine  20 mg daily Symptoms are well controlled.   OAB: oxybutynin  5 mg HS Symptoms are well controlled.  If he is off of this, he will have urinary frequency.   BPH: silodosin  4 mg daily Symptoms are well controlled. Urine stream is good.   DM: Januvia  100 mg daily 01/08/24: POC A1C: 5.9 Recheck A1C today.   HLD: rosuvastatin  10 mg daily Tolerates well. Not fasting today. Will defer lipid panel today.  Refill sent.   HTN: losartan 25 mg daily  Denies chest pain and shortness of breath.  Well controlled. Recommend randomly checking BP at home. Always controlled when checking. Refill sent.    Tremor: primidone  5 mg 4 tablets at HS  Occasionally has some nervousness in hands  Symptoms are fairly well controlled.  Stage 3b CKD.  Sees kidney specialist: Dr. Satko with [facility].    Right leg pain: from ankle to thigh.  No injury. Taking naproxen  or tylenol . Tylenol  will take pain away.  Sitting down will ease pain, tylenol  will resolve it.  Instructed to avoid taking more than 3000 mg in 24 hours. Recommend avoiding NSAIDs due to kidney function.   Chart review:  04/29/24: CMP: GFR: 35.48, Creatinine 1.88 01/08/24: POC A1C: 6.8 04/29/24: Stage 3b CKD:  GFR 35.4  ROS    Objective:     BP 129/76 (BP Location: Left Arm, Patient Position: Sitting, Cuff Size: Normal)   Pulse 77   Temp (!) 97.5 F (36.4 C) (Oral)   Ht 5' 7 (1.702 m)   Wt 191 lb 12.8 oz (87 kg)   SpO2 98%   BMI 30.04 kg/m  BP Readings from Last 3 Encounters:  06/24/24 129/76   04/29/24 122/80  01/08/24 126/80      Physical Exam Vitals and nursing note reviewed.  Constitutional:      General: He is not in acute distress.    Appearance: Normal appearance.  Cardiovascular:     Rate and Rhythm: Normal rate and regular rhythm.     Heart sounds: Normal heart sounds.  Pulmonary:     Effort: Pulmonary effort is normal.     Breath sounds: Normal breath sounds.  Skin:    General: Skin is warm and dry.  Neurological:     General: No focal deficit present.     Mental Status: He is alert. Mental status is at baseline.  Psychiatric:        Mood and Affect: Mood normal.        Behavior: Behavior normal.        Thought Content: Thought content normal.        Judgment: Judgment normal.      No results found for any visits on 06/24/24.  Last metabolic panel Lab Results  Component Value Date   GLUCOSE 125 (H) 05/09/2024   NA 139 05/09/2024   K 4.2 05/09/2024   CL 104 05/09/2024   CO2 28 05/09/2024   BUN 29 (H) 05/09/2024   CREATININE 1.98 (H) 05/09/2024   GFR 33.33 (L) 05/09/2024  CALCIUM  9.0 05/09/2024   PHOS 4.4 (H) 11/21/2021   PROT 7.1 05/09/2024   ALBUMIN 4.4 05/09/2024   LABGLOB 2.9 07/11/2023   BILITOT 0.4 05/09/2024   ALKPHOS 67 05/09/2024   AST 31 05/09/2024   ALT 35 05/09/2024   Last hemoglobin A1c Lab Results  Component Value Date   HGBA1C 5.9 01/08/2024      The ASCVD Risk score (Arnett DK, et al., 2019) failed to calculate for the following reasons:   The valid HDL cholesterol range is 20 to 100 mg/dL    Assessment & Plan:   Type 2 diabetes mellitus, controlled, with renal complications (HCC) -     Basic metabolic panel with GFR -     Hemoglobin A1c  Benign essential tremor  Benign prostatic hyperplasia with lower urinary tract symptoms, symptom details unspecified  GAD (generalized anxiety disorder)  Hyperlipidemia, unspecified hyperlipidemia type -     Rosuvastatin  Calcium ; Take 1 tablet (10 mg total) by mouth  daily. RX from Dr Floy  Dispense: 90 tablet; Refill: 1  Primary hypertension -     Losartan Potassium; Take 1 tablet (25 mg total) by mouth daily.  Dispense: 90 tablet; Refill: 1    Agrees with plan of care discussed.  Questions answered.   Return in about 6 months (around 12/22/2024) for chronic conditions .    [name], FNP

## 2024-06-25 ENCOUNTER — Ambulatory Visit: Payer: Self-pay | Admitting: Family Medicine

## 2024-06-25 LAB — BASIC METABOLIC PANEL WITH GFR
BUN/Creatinine Ratio: 13 (ref 10–24)
BUN: 22 mg/dL (ref 8–27)
CO2: 21 mmol/L (ref 20–29)
Calcium: 9.2 mg/dL (ref 8.6–10.2)
Chloride: 105 mmol/L (ref 96–106)
Creatinine, Ser: 1.76 mg/dL — ABNORMAL HIGH (ref 0.76–1.27)
Glucose: 101 mg/dL — ABNORMAL HIGH (ref 70–99)
Potassium: 4.8 mmol/L (ref 3.5–5.2)
Sodium: 141 mmol/L (ref 134–144)
eGFR: 41 mL/min/1.73 — ABNORMAL LOW (ref >59)

## 2024-06-25 LAB — HEMOGLOBIN A1C
Est. average glucose Bld gHb Est-mCnc: 128 mg/dL
Hgb A1c MFr Bld: 6.1 % — ABNORMAL HIGH (ref 4.8–5.6)

## 2024-07-01 ENCOUNTER — Other Ambulatory Visit: Payer: Self-pay | Admitting: Family Medicine

## 2024-07-01 DIAGNOSIS — G250 Essential tremor: Secondary | ICD-10-CM

## 2024-07-01 NOTE — Telephone Encounter (Unsigned)
 Copied from CRM [id number]. Topic: Clinical - Medication Refill >> Jul 01, 2024  2:40 PM Selinda RAMAN wrote: Medication: busPIRone  (BUSPAR ) 5 MG tablet, primidone  (MYSOLINE ) 50 MG tablet  Has the patient contacted their pharmacy? Yes   This is the patient's preferred pharmacy:  [facility] - [place], [place] - [address] [place] [address] Phone: [phone] Fax: [phone]    Is this the correct pharmacy for this prescription? Yes If no, delete pharmacy and type the correct one.   Has the prescription been filled recently? No  Is the patient out of the medication? No but the patient is running low  Has the patient been seen for an appointment in the last year OR does the patient have an upcoming appointment? Yes  Can we respond through MyChart? Yes  Please assist patient further

## 2024-07-02 ENCOUNTER — Other Ambulatory Visit: Payer: Self-pay | Admitting: Family Medicine

## 2024-07-02 ENCOUNTER — Telehealth: Payer: Self-pay

## 2024-07-02 DIAGNOSIS — G250 Essential tremor: Secondary | ICD-10-CM

## 2024-07-02 MED ORDER — PRIMIDONE 50 MG PO TABS: ORAL_TABLET | ORAL | 0 refills | Status: DC

## 2024-07-02 MED ORDER — BUSPIRONE HCL 5 MG PO TABS: ORAL_TABLET | ORAL | 0 refills | Status: DC

## 2024-07-02 NOTE — Telephone Encounter (Signed)
 [facility] mail order pharmacy is requesting an updated rx for the primidone  rx. Per the pharmacist Erline Milo - the sig should be: 4 tabs at bedtime as needed (the patient has completed the tapering of the rx).

## 2024-07-08 ENCOUNTER — Other Ambulatory Visit: Payer: Self-pay | Admitting: Family Medicine

## 2024-07-08 DIAGNOSIS — G250 Essential tremor: Secondary | ICD-10-CM

## 2024-07-14 ENCOUNTER — Ambulatory Visit: Admitting: Urgent Care

## 2024-07-14 ENCOUNTER — Ambulatory Visit: Admitting: Sports Medicine

## 2024-07-30 ENCOUNTER — Other Ambulatory Visit: Payer: Self-pay | Admitting: Family Medicine

## 2024-07-30 ENCOUNTER — Encounter: Payer: Self-pay | Admitting: Family Medicine

## 2024-07-30 DIAGNOSIS — G250 Essential tremor: Secondary | ICD-10-CM

## 2024-08-11 ENCOUNTER — Other Ambulatory Visit: Payer: Self-pay | Admitting: Family Medicine

## 2024-08-11 DIAGNOSIS — G250 Essential tremor: Secondary | ICD-10-CM

## 2024-08-13 ENCOUNTER — Other Ambulatory Visit: Payer: Self-pay | Admitting: Family Medicine

## 2024-08-13 DIAGNOSIS — G250 Essential tremor: Secondary | ICD-10-CM

## 2024-08-13 MED ORDER — PRIMIDONE 50 MG PO TABS: ORAL_TABLET | ORAL | 1 refills | Status: AC

## 2024-08-13 NOTE — Telephone Encounter (Signed)
 Copied from CRM [id number]. Topic: Clinical - Medication Refill >> Aug 13, 2024  1:29 PM Amy B wrote: Medication: primidone  (MYSOLINE ) 50 MG tablet  Has the patient contacted their pharmacy? Yes (Agent: If no, request that the patient contact the pharmacy for the refill. If patient does not wish to contact the pharmacy document the reason why and proceed with request.) (Agent: If yes, when and what did the pharmacy advise?)  This is the patient's preferred pharmacy:  [facility] - [place], [place] - [address] [place] [place] [address] Phone: [phone] Fax: [phone]  Is this the correct pharmacy for this prescription? Yes If no, delete pharmacy and type the correct one.   Has the prescription been filled recently? No  Is the patient out of the medication? No  Has the patient been seen for an appointment in the last year OR does the patient have an upcoming appointment? Yes  Can we respond through MyChart? Yes  Agent: Please be advised that Rx refills may take up to 3 business days. We ask that you follow-up with your pharmacy.

## 2024-08-20 ENCOUNTER — Other Ambulatory Visit: Payer: Self-pay | Admitting: Medical-Surgical

## 2024-08-20 ENCOUNTER — Other Ambulatory Visit: Payer: Self-pay | Admitting: Family Medicine

## 2024-08-20 ENCOUNTER — Ambulatory Visit

## 2024-08-20 DIAGNOSIS — G250 Essential tremor: Secondary | ICD-10-CM

## 2024-08-20 NOTE — Telephone Encounter (Signed)
 Copied from CRM [id number]. Topic: Clinical - Medication Refill >> Aug 20, 2024  5:09 PM Kevelyn M wrote: Medication: primidone  (MYSOLINE ) 50 MG tablet  Has the patient contacted their pharmacy? Yes (Agent: If no, request that the patient contact the pharmacy for the refill. If patient does not wish to contact the pharmacy document the reason why and proceed with request.) (Agent: If yes, when and what did the pharmacy advise?)  This is the patient's preferred pharmacy:  [facility] - [place], [place] - [address] [place] [address] Phone: [phone] Fax: [phone]  Is this the correct pharmacy for this prescription? Yes If no, delete pharmacy and type the correct one.   Has the prescription been filled recently? Yes  Is the patient out of the medication? No  Has the patient been seen for an appointment in the last year OR does the patient have an upcoming appointment? Yes  Can we respond through MyChart? No  Agent: Please be advised that Rx refills may take up to 3 business days. We ask that you follow-up with your pharmacy.

## 2024-08-20 NOTE — Telephone Encounter (Signed)
 Copied from CRM [id number]. Topic: Clinical - Medication Refill >> Aug 20, 2024  5:16 PM Shanda MATSU wrote: Medication: famotidine  (PEPCID ) 20 MG tablet   Has the patient contacted their pharmacy? No (Agent: If no, request that the patient contact the pharmacy for the refill. If patient does not wish to contact the pharmacy document the reason why and proceed with request.) (Agent: If yes, when and what did the pharmacy advise?)  This is the patient's preferred pharmacy:  [facility] - [place], [place] - [address] [place] [address] Phone: [phone] Fax: [phone]  Is this the correct pharmacy for this prescription? Yes If no, delete pharmacy and type the correct one.   Has the prescription been filled recently? No  Is the patient out of the medication? Yes  Has the patient been seen for an appointment in the last year OR does the patient have an upcoming appointment? Yes  Can we respond through MyChart? No  Agent: Please be advised that Rx refills may take up to 3 business days. We ask that you follow-up with your pharmacy.

## 2024-08-27 ENCOUNTER — Other Ambulatory Visit: Payer: Self-pay | Admitting: Family Medicine

## 2024-08-27 DIAGNOSIS — G250 Essential tremor: Secondary | ICD-10-CM

## 2024-08-27 NOTE — Telephone Encounter (Signed)
 Copied from CRM [id number]. Topic: Clinical - Medication Refill >> Aug 27, 2024  3:23 PM Zebedee SAUNDERS wrote: Medication: famotidine  (PEPCID ) 20 MG tablet  Has the patient contacted their pharmacy? Yes (Agent: If no, request that the patient contact the pharmacy for the refill. If patient does not wish to contact the pharmacy document the reason why and proceed with request.) (Agent: If yes, when and what did the pharmacy advise?)  This is the patient's preferred pharmacy:  [facility] - [place], [place] - [address] [place] [address] Phone: [phone] Fax: [phone]  Is this the correct pharmacy for this prescription? Yes If no, delete pharmacy and type the correct one.   Has the prescription been filled recently? Yes  Is the patient out of the medication? Yes  Has the patient been seen for an appointment in the last year OR does the patient have an upcoming appointment? Yes  Can we respond through MyChart? Yes  Agent: Please be advised that Rx refills may take up to 3 business days. We ask that you follow-up with your pharmacy.

## 2024-08-27 NOTE — Telephone Encounter (Signed)
 Copied from CRM [id number]. Topic: Clinical - Medication Refill >> Aug 27, 2024  3:16 PM Kevelyn M wrote: Medication: primidone  (MYSOLINE ) 50 MG tablet  Has the patient contacted their pharmacy? Yes (Agent: If no, request that the patient contact the pharmacy for the refill. If patient does not wish to contact the pharmacy document the reason why and proceed with request.) (Agent: If yes, when and what did the pharmacy advise?)  This is the patient's preferred pharmacy:  [facility] - [place], [place] - [address] [place] [address] Phone: [phone] Fax: [phone]  Is this the correct pharmacy for this prescription? Yes If no, delete pharmacy and type the correct one.   Has the prescription been filled recently? No  Is the patient out of the medication? Yes  Has the patient been seen for an appointment in the last year OR does the patient have an upcoming appointment? Yes  Can we respond through MyChart? No  Agent: Please be advised that Rx refills may take up to 3 business days. We ask that you follow-up with your pharmacy.

## 2024-08-28 MED ORDER — FAMOTIDINE 20 MG PO TABS: 20.0000 mg | ORAL_TABLET | Freq: Every day | ORAL | 0 refills | Status: AC

## 2024-09-04 ENCOUNTER — Telehealth: Payer: Self-pay | Admitting: Family Medicine

## 2024-09-04 DIAGNOSIS — G250 Essential tremor: Secondary | ICD-10-CM

## 2024-09-04 NOTE — Telephone Encounter (Signed)
 Copied from CRM #[id number]. Topic: Clinical - Medication Refill >> Sep 04, 2024  9:32 AM Alfonso HERO wrote: Medication: primidone  (MYSOLINE ) 50 MG tablet  Has the patient contacted their pharmacy? Yes (Agent: If no, request that the patient contact the pharmacy for the refill. If patient does not wish to contact the pharmacy document the reason why and proceed with request.) (Agent: If yes, when and what did the pharmacy advise?)  This is the patient's preferred pharmacy:  [facility] - [place], [place] - [address] [place] [address] Phone: [phone] Fax: [phone]  Is this the correct pharmacy for this prescription? Yes If no, delete pharmacy and type the correct one.   Has the prescription been filled recently? Yes  Is the patient out of the medication? Yes  Has the patient been seen for an appointment in the last year OR does the patient have an upcoming appointment? Yes  Can we respond through MyChart? Yes  Agent: Please be advised that Rx refills may take up to 3 business days. We ask that you follow-up with your pharmacy.

## 2024-09-04 NOTE — Telephone Encounter (Signed)
 Previous PCP has filled Primidone  on 12/31 with 1 refill. Too soon to refill.

## 2024-09-11 ENCOUNTER — Other Ambulatory Visit: Payer: Self-pay | Admitting: Family Medicine

## 2024-09-11 NOTE — Telephone Encounter (Signed)
 Copied from CRM #[id number]. Topic: Clinical - Medication Refill >> Sep 11, 2024 12:44 PM Shereese L wrote: Medication: busPIRone  (BUSPAR ) 5 MG tablet  Has the patient contacted their pharmacy? Yes (Agent: If no, request that the patient contact the pharmacy for the refill. If patient does not wish to contact the pharmacy document the reason why and proceed with request.) (Agent: If yes, when and what did the pharmacy advise?)  This is the patient's preferred pharmacy:  [facility] - [place], [place] - [address] [place] [address] Phone: [phone] Fax: [phone]  Is this the correct pharmacy for this prescription? Yes If no, delete pharmacy and type the correct one.   Has the prescription been filled recently? Yes  Is the patient out of the medication? Yes  Has the patient been seen for an appointment in the last year OR does the patient have an upcoming appointment? Yes  Can we respond through MyChart? Yes  Agent: Please be advised that Rx refills may take up to 3 business days. We ask that you follow-up with your pharmacy.

## 2024-09-12 ENCOUNTER — Telehealth: Payer: Self-pay

## 2024-09-12 DIAGNOSIS — F5101 Primary insomnia: Secondary | ICD-10-CM

## 2024-09-12 MED ORDER — HYDROXYZINE HCL 50 MG PO TABS: 50.0000 mg | ORAL_TABLET | Freq: Every day | ORAL | 1 refills | Status: AC

## 2024-09-12 MED ORDER — BUSPIRONE HCL 5 MG PO TABS: ORAL_TABLET | ORAL | 0 refills | Status: AC

## 2024-09-12 NOTE — Telephone Encounter (Signed)
 Are there any updates regarding this request whether it's been completed, pending or never received? Thanks in advance.

## 2024-09-12 NOTE — Telephone Encounter (Signed)
 Noted. Thank you for providing an update.SABRA

## 2024-09-12 NOTE — Telephone Encounter (Signed)
Placed in providers box for signature 

## 2024-09-12 NOTE — Telephone Encounter (Signed)
 Copied from CRM #[id number]. Topic: Clinical - Medication Refill >> Sep 11, 2024 12:38 PM Corin V wrote: Medication: hydrOXYzine  (ATARAX ) 50 MG tablet famotidine  (PEPCID ) 20 MG tablet (script from 08/28/24 never received)  Has the patient contacted their pharmacy? Yes (Agent: If no, request that the patient contact the pharmacy for the refill. If patient does not wish to contact the pharmacy document the reason why and proceed with request.) (Agent: If yes, when and what did the pharmacy advise?)  This is the patient's preferred pharmacy:  [facility] - [place], [place] - [address] [place] [address] Phone: [phone] Fax: [phone]   Is this the correct pharmacy for this prescription? Yes If no, delete pharmacy and type the correct one.   Has the prescription been filled recently? No  Is the patient out of the medication? Yes  Has the patient been seen for an appointment in the last year OR does the patient have an upcoming appointment? Yes  Can we respond through MyChart? Yes  Agent: Please be advised that Rx refills may take up to 3 business days. We ask that you follow-up with your pharmacy.

## 2024-09-12 NOTE — Telephone Encounter (Signed)
 Copied from CRM #[id number]. Topic: Clinical - Medication Question >> Sep 11, 2024  4:15 PM Fonda T wrote: Reason for CRM: Received call from [place], with [facility] processing dept, calling on behalf o fpt.  Calling to check status of faxed request to office for medication Freestyle Libre 3 Plus.  Reports needs form returned sigend,a s well as records to support.  States forms were faxed to office on 08/12/24, 08/25/24, and 09/09/24.  Requesting a follow up call top get status results.  Can be reached at [phone], opt [phone]

## 2024-09-15 ENCOUNTER — Other Ambulatory Visit: Payer: Self-pay | Admitting: Family Medicine

## 2024-09-17 NOTE — Telephone Encounter (Signed)
 I have attempted to return call to call back number provided but unfortunately become lost being transferred between departments. Call will keep ringing with hold music and then stop with no answer when being transferred.

## 2024-09-17 NOTE — Telephone Encounter (Unsigned)
 Copied from CRM [id number]. Topic: Clinical - Order For Equipment >> Sep 17, 2024  2:25 PM Fonda T wrote: Reason for CRM: Received call from [place], with [facility] processing dept, calling to check status of DME order that was faxed to office on 09/09/24, and 09/17/24.  DME order requesting Freestyle Libre 3 plus.  Per chart review, advised caller verbatim: Contacted [facility], all representatives were occupied. I have left a detailed voice message stating our office has been closed due to the inclement weather and will remain closed until Monday next week. I advised we will do our best to fax all forms this upcoming Monday 09/22/2024. [place] Round RMA   Requesting a follow up call to advise further, can be reached at [phone].

## 2024-09-17 NOTE — Telephone Encounter (Signed)
 Copied from CRM #[id number]. Topic: Clinical - Order For Equipment >> Sep 17, 2024  9:46 AM Antwanette L wrote: Reason for CRM: Pam from [facility] will refax a request for glucose supplies. The form to be completed and faxed back as soon as possible. Pam can be reached [phone].  Contacted [facility], all representatives were occupied. I have left a detailed voice message stating our office has been closed due to the inclement weather and will remain closed until Monday next week. I advised we will do our best to fax all forms this upcoming Monday 09/22/2024. [place] Round RMA

## 2024-09-19 ENCOUNTER — Telehealth: Payer: Self-pay

## 2024-09-19 NOTE — Telephone Encounter (Signed)
 The refill request for hydroxyzine  was completed on 09/12/24. The patient was notified on 09/12/24. Sent to [facility] order pharmacy. Note will be closed.

## 2024-09-19 NOTE — Telephone Encounter (Signed)
 Copied from CRM #[id number]. Topic: Clinical - Medication Refill >> Sep 18, 2024  5:18 PM Sophia H wrote: Medication: hydrOXYzine  (ATARAX ) 50 MG tablet   Has the patient contacted their pharmacy? Yes, pharmacy calling on behalf of the patient   This is the patient's preferred pharmacy:  [facility] - [place], [place] - [address] [place] [address] Phone: [phone] Fax: [phone]   Is this the correct pharmacy for this prescription? Yes If no, delete pharmacy and type the correct one.   Has the prescription been filled recently? Yes  Is the patient out of the medication? Yes  Has the patient been seen for an appointment in the last year OR does the patient have an upcoming appointment? Yes, appt on the 12th of February   Can we respond through MyChart? Yes  Agent: Please be advised that Rx refills may take up to 3 business days. We ask that you follow-up with your pharmacy.

## 2024-09-25 ENCOUNTER — Ambulatory Visit: Admitting: Family Medicine

## 2024-12-22 ENCOUNTER — Ambulatory Visit: Admitting: Family Medicine
